# Patient Record
Sex: Male | Born: 1960 | Race: White | Hispanic: No | Marital: Married | Smoking: Current every day smoker
Health system: Southern US, Community
[De-identification: ages and names within clinical notes are randomized; demographics above are authoritative.]

## PROBLEM LIST (undated history)

## (undated) DIAGNOSIS — M1990 Unspecified osteoarthritis, unspecified site: Secondary | ICD-10-CM

## (undated) DIAGNOSIS — Z89519 Acquired absence of unspecified leg below knee: Secondary | ICD-10-CM

## (undated) DIAGNOSIS — F32A Depression, unspecified: Secondary | ICD-10-CM

## (undated) DIAGNOSIS — Z972 Presence of dental prosthetic device (complete) (partial): Secondary | ICD-10-CM

## (undated) DIAGNOSIS — F329 Major depressive disorder, single episode, unspecified: Secondary | ICD-10-CM

## (undated) DIAGNOSIS — E119 Type 2 diabetes mellitus without complications: Secondary | ICD-10-CM

## (undated) DIAGNOSIS — R0600 Dyspnea, unspecified: Secondary | ICD-10-CM

## (undated) DIAGNOSIS — F410 Panic disorder [episodic paroxysmal anxiety] without agoraphobia: Secondary | ICD-10-CM

## (undated) HISTORY — PX: HAND SURGERY: SHX662

## (undated) HISTORY — DX: Major depressive disorder, single episode, unspecified: F32.9

## (undated) HISTORY — PX: LEG AMPUTATION: SHX1105

## (undated) HISTORY — DX: Type 2 diabetes mellitus without complications: E11.9

## (undated) HISTORY — DX: Depression, unspecified: F32.A

## (undated) HISTORY — PX: LEG SURGERY: SHX1003

---

## 2005-03-14 ENCOUNTER — Emergency Department: Payer: Self-pay | Admitting: Emergency Medicine

## 2011-02-17 ENCOUNTER — Emergency Department: Payer: Self-pay | Admitting: Emergency Medicine

## 2011-04-15 ENCOUNTER — Ambulatory Visit: Payer: Self-pay | Admitting: Podiatry

## 2011-05-12 ENCOUNTER — Ambulatory Visit: Payer: Self-pay | Admitting: Pain Medicine

## 2011-05-30 ENCOUNTER — Ambulatory Visit: Payer: Self-pay | Admitting: Pain Medicine

## 2011-06-02 ENCOUNTER — Ambulatory Visit: Payer: Self-pay | Admitting: Pain Medicine

## 2011-06-02 ENCOUNTER — Other Ambulatory Visit: Payer: Self-pay | Admitting: Pain Medicine

## 2011-06-02 LAB — CBC WITH DIFFERENTIAL/PLATELET
Basophil #: 0 10*3/uL (ref 0.0–0.1)
Basophil %: 0.5 %
Eosinophil %: 1.8 %
HCT: 45 % (ref 40.0–52.0)
HGB: 15.3 g/dL (ref 13.0–18.0)
Lymphocyte %: 20.8 %
MCH: 30.5 pg (ref 26.0–34.0)
MCV: 90 fL (ref 80–100)
Monocyte %: 5.6 %
Neutrophil %: 71.3 %
RBC: 5.01 10*6/uL (ref 4.40–5.90)
RDW: 13.4 % (ref 11.5–14.5)
WBC: 10 10*3/uL (ref 3.8–10.6)

## 2011-06-20 ENCOUNTER — Ambulatory Visit: Payer: Self-pay | Admitting: Pain Medicine

## 2011-07-19 ENCOUNTER — Ambulatory Visit: Payer: Self-pay | Admitting: Pain Medicine

## 2011-07-25 ENCOUNTER — Ambulatory Visit: Payer: Self-pay | Admitting: Pain Medicine

## 2011-08-18 ENCOUNTER — Ambulatory Visit: Payer: Self-pay | Admitting: Pain Medicine

## 2011-08-29 ENCOUNTER — Ambulatory Visit: Payer: Self-pay | Admitting: Pain Medicine

## 2011-09-15 ENCOUNTER — Ambulatory Visit: Payer: Self-pay | Admitting: Pain Medicine

## 2011-09-21 ENCOUNTER — Ambulatory Visit: Payer: Self-pay | Admitting: Pain Medicine

## 2011-10-03 DIAGNOSIS — M19079 Primary osteoarthritis, unspecified ankle and foot: Secondary | ICD-10-CM | POA: Insufficient documentation

## 2011-10-03 DIAGNOSIS — IMO0002 Reserved for concepts with insufficient information to code with codable children: Secondary | ICD-10-CM | POA: Insufficient documentation

## 2011-10-18 ENCOUNTER — Ambulatory Visit: Payer: Self-pay | Admitting: Pain Medicine

## 2011-10-31 ENCOUNTER — Ambulatory Visit: Payer: Self-pay | Admitting: Pain Medicine

## 2011-11-09 DIAGNOSIS — M25579 Pain in unspecified ankle and joints of unspecified foot: Secondary | ICD-10-CM | POA: Insufficient documentation

## 2011-12-15 ENCOUNTER — Ambulatory Visit: Payer: Self-pay | Admitting: Pain Medicine

## 2011-12-26 DIAGNOSIS — M12579 Traumatic arthropathy, unspecified ankle and foot: Secondary | ICD-10-CM | POA: Insufficient documentation

## 2012-01-16 ENCOUNTER — Ambulatory Visit: Payer: Self-pay | Admitting: Pain Medicine

## 2012-02-14 ENCOUNTER — Ambulatory Visit: Payer: Self-pay | Admitting: Pain Medicine

## 2012-02-22 ENCOUNTER — Ambulatory Visit: Payer: Self-pay | Admitting: Pain Medicine

## 2012-03-14 ENCOUNTER — Emergency Department: Payer: Self-pay | Admitting: Emergency Medicine

## 2012-03-15 ENCOUNTER — Ambulatory Visit: Payer: Self-pay | Admitting: Pain Medicine

## 2012-03-26 ENCOUNTER — Ambulatory Visit: Payer: Self-pay | Admitting: Pain Medicine

## 2012-04-07 LAB — CBC
HGB: 14.4 g/dL (ref 13.0–18.0)
MCH: 29.6 pg (ref 26.0–34.0)
MCHC: 33.3 g/dL (ref 32.0–36.0)
MCV: 89 fL (ref 80–100)
RDW: 14.4 % (ref 11.5–14.5)

## 2012-04-07 LAB — BASIC METABOLIC PANEL
Anion Gap: 6 — ABNORMAL LOW (ref 7–16)
BUN: 11 mg/dL (ref 7–18)
Calcium, Total: 8.9 mg/dL (ref 8.5–10.1)
Creatinine: 0.74 mg/dL (ref 0.60–1.30)
EGFR (African American): >60
EGFR (Non-African Amer.): >60
Glucose: 96 mg/dL (ref 65–99)
Osmolality: 279 (ref 275–301)
Potassium: 4.2 mmol/L (ref 3.5–5.1)

## 2012-04-07 LAB — CK TOTAL AND CKMB (NOT AT ARMC)
CK, Total: 135 U/L (ref 35–232)
CK-MB: 0.7 ng/mL (ref 0.5–3.6)

## 2012-04-07 LAB — TROPONIN I: Troponin-I: <0.02 ng/mL

## 2012-04-08 ENCOUNTER — Observation Stay: Payer: Self-pay | Admitting: Internal Medicine

## 2012-04-08 LAB — TROPONIN I: Troponin-I: <0.02 ng/mL

## 2012-04-16 ENCOUNTER — Ambulatory Visit: Payer: Self-pay | Admitting: Pain Medicine

## 2012-05-07 ENCOUNTER — Ambulatory Visit: Payer: Self-pay | Admitting: Pain Medicine

## 2012-06-12 ENCOUNTER — Ambulatory Visit: Payer: Self-pay | Admitting: Pain Medicine

## 2012-06-18 ENCOUNTER — Ambulatory Visit: Payer: Self-pay | Admitting: Pain Medicine

## 2012-07-09 ENCOUNTER — Ambulatory Visit: Payer: Self-pay | Admitting: Pain Medicine

## 2012-08-09 ENCOUNTER — Ambulatory Visit: Payer: Self-pay | Admitting: Pain Medicine

## 2012-09-11 ENCOUNTER — Ambulatory Visit: Payer: Self-pay | Admitting: Pain Medicine

## 2012-09-24 ENCOUNTER — Ambulatory Visit: Payer: Self-pay | Admitting: Pain Medicine

## 2012-10-09 ENCOUNTER — Ambulatory Visit: Payer: Self-pay | Admitting: Pain Medicine

## 2012-10-22 ENCOUNTER — Ambulatory Visit: Payer: Self-pay | Admitting: Pain Medicine

## 2012-11-07 ENCOUNTER — Ambulatory Visit: Payer: Self-pay | Admitting: Pain Medicine

## 2012-12-04 ENCOUNTER — Ambulatory Visit: Payer: Self-pay | Admitting: Pain Medicine

## 2012-12-12 ENCOUNTER — Ambulatory Visit: Payer: Self-pay | Admitting: Pain Medicine

## 2013-01-03 ENCOUNTER — Ambulatory Visit: Payer: Self-pay | Admitting: Pain Medicine

## 2013-01-18 DIAGNOSIS — R030 Elevated blood-pressure reading, without diagnosis of hypertension: Secondary | ICD-10-CM | POA: Insufficient documentation

## 2013-02-07 ENCOUNTER — Ambulatory Visit: Payer: Self-pay | Admitting: Pain Medicine

## 2013-02-20 ENCOUNTER — Ambulatory Visit: Payer: Self-pay | Admitting: Pain Medicine

## 2013-03-05 ENCOUNTER — Ambulatory Visit: Payer: Self-pay | Admitting: Pain Medicine

## 2013-04-01 ENCOUNTER — Ambulatory Visit: Payer: Self-pay | Admitting: Pain Medicine

## 2013-05-02 ENCOUNTER — Ambulatory Visit: Payer: Self-pay | Admitting: Pain Medicine

## 2013-05-08 ENCOUNTER — Ambulatory Visit: Payer: Self-pay | Admitting: Pain Medicine

## 2013-06-04 ENCOUNTER — Ambulatory Visit: Payer: Self-pay | Admitting: Pain Medicine

## 2013-06-10 ENCOUNTER — Ambulatory Visit: Payer: Self-pay | Admitting: Pain Medicine

## 2013-07-04 ENCOUNTER — Ambulatory Visit: Payer: Self-pay | Admitting: Pain Medicine

## 2013-07-19 DIAGNOSIS — F419 Anxiety disorder, unspecified: Secondary | ICD-10-CM | POA: Insufficient documentation

## 2013-07-19 DIAGNOSIS — F329 Major depressive disorder, single episode, unspecified: Secondary | ICD-10-CM | POA: Insufficient documentation

## 2013-07-19 DIAGNOSIS — F32A Depression, unspecified: Secondary | ICD-10-CM | POA: Insufficient documentation

## 2013-08-01 ENCOUNTER — Ambulatory Visit: Payer: Self-pay | Admitting: Pain Medicine

## 2013-08-19 ENCOUNTER — Ambulatory Visit: Payer: Self-pay | Admitting: Pain Medicine

## 2013-08-29 ENCOUNTER — Ambulatory Visit: Payer: Self-pay | Admitting: Pain Medicine

## 2013-10-01 ENCOUNTER — Ambulatory Visit: Payer: Self-pay | Admitting: Pain Medicine

## 2013-10-23 ENCOUNTER — Ambulatory Visit: Payer: Self-pay | Admitting: Pain Medicine

## 2013-10-28 ENCOUNTER — Ambulatory Visit: Payer: Self-pay | Admitting: Pain Medicine

## 2014-01-03 DIAGNOSIS — T879 Unspecified complications of amputation stump: Secondary | ICD-10-CM | POA: Insufficient documentation

## 2014-01-03 DIAGNOSIS — G546 Phantom limb syndrome with pain: Secondary | ICD-10-CM | POA: Insufficient documentation

## 2014-05-11 IMAGING — CR DG HIP COMPLETE 2+V*L*
1 series · 3 of 3 positions shown · non-contrast
Comparison: none

REASON FOR EXAM: fall/hip pain
COMMENTS:

PROCEDURE:     DXR - DXR HIP LEFT COMPLETE  - March 14, 2012 [DATE]
RESULT:     AP and lateral views of the left hip reveal the bones to be
adequately mineralized. There is no evidence of an acute fracture nor
dislocation. The joint space is normal.

[Series 1: ap · 0.17mm/px · 3 of 3 slices shown]
[im 1/3]
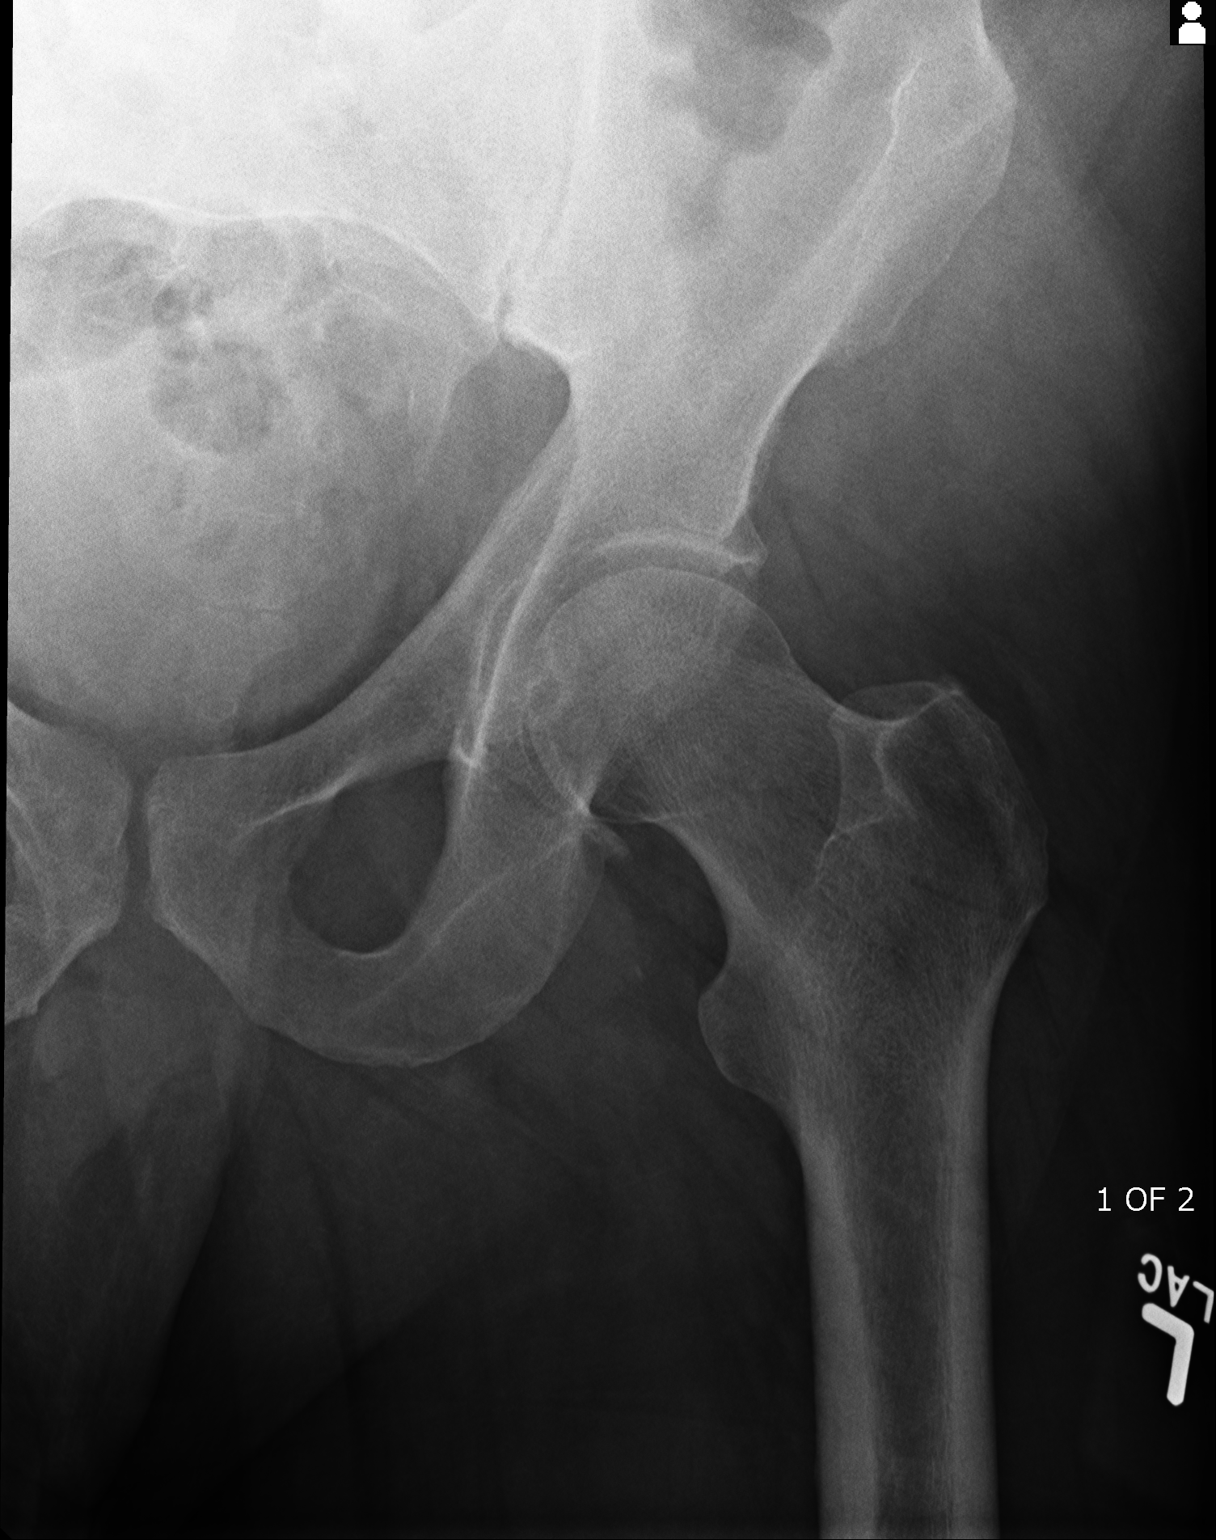
[im 2/3]
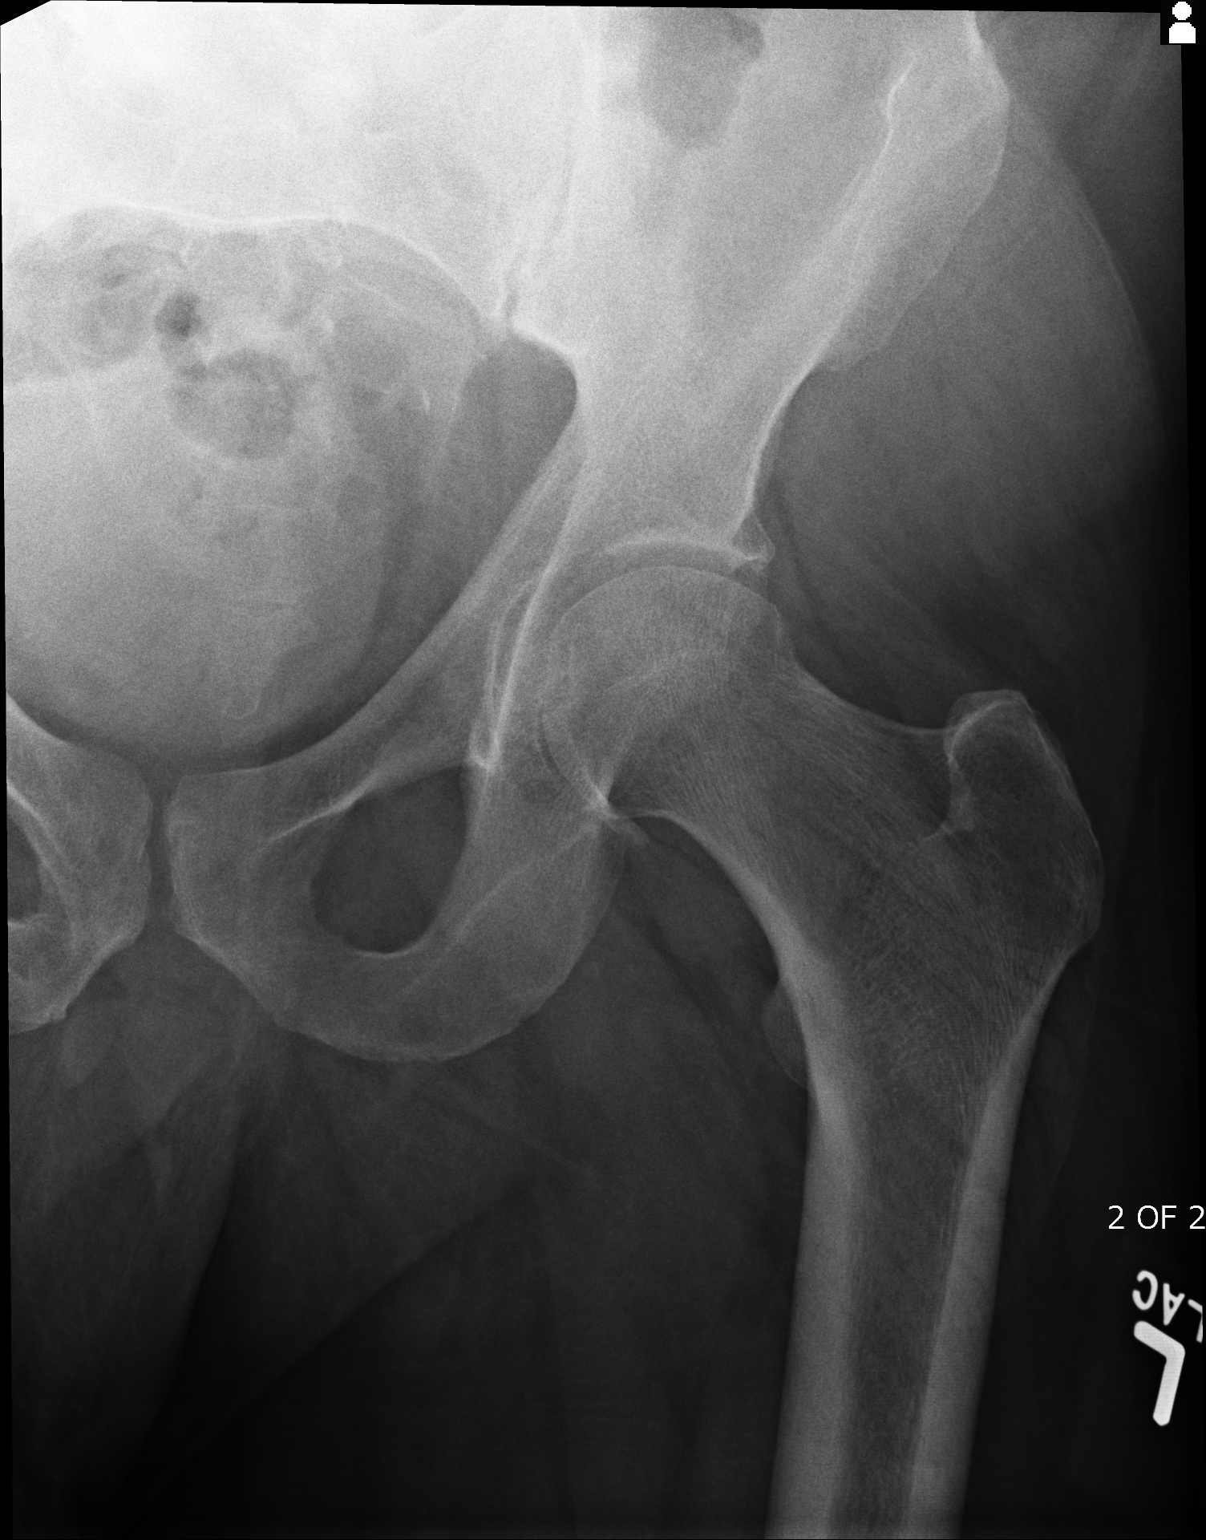
[im 3/3]
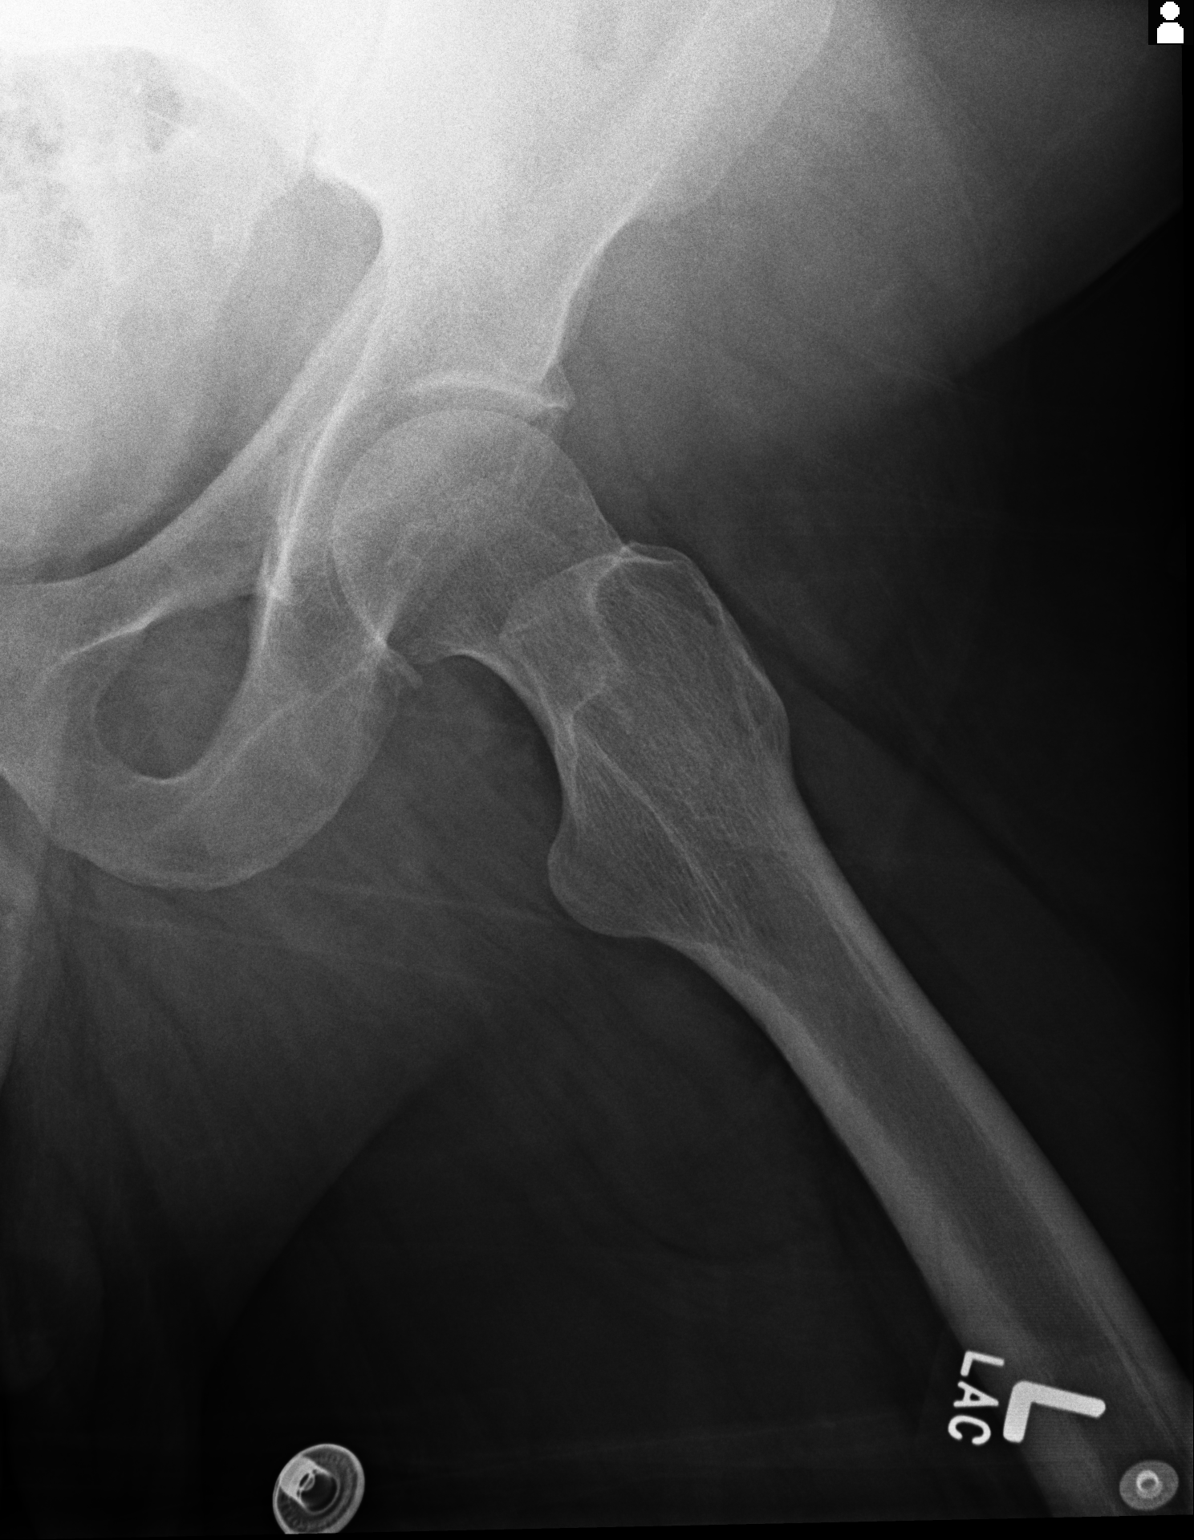

[3 of 3 positions shown; findings below may reference images not displayed]

IMPRESSION: There is no acute bony abnormality of the left hip.

[REDACTED]
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
## 2014-07-29 NOTE — Consult Note (Signed)
Brief Consult Note: Diagnosis: cad risk factors with chest and ar pain and no current evidence of mi or ecg changes.   Patient was seen by consultant.   Consult note dictated.   Comments: no further cardiac diagnositcs at this time ambulate and follow for sx continue risk factor meds ok for dc to home from cardiac standpoint with outpt fu Aldair Rickel this week and consider stress test at that time.  Electronic Signatures: Corey Skains (MD)  (Signed 29-Dec-13 07:37)  Authored: Brief Consult Note   Last Updated: 29-Dec-13 07:37 by Corey Skains (MD)

## 2014-07-29 NOTE — H&P (Signed)
PATIENT NAME:  [name] Day, [name] Day MR#:  [id number] DATE OF BIRTH:  07-Sep-1960  DATE OF ADMISSION:  04/08/2012  PRIMARY CARE PHYSICIAN:  Dr. Shade Flood at [facility].   REFERRING PHYSICIAN:  Gretchen Short. Beather Arbour, MD   CHIEF COMPLAINT:  Chest pain.   HISTORY OF PRESENT ILLNESS:  The patient is 54 year old Caucasian male with a history of panic attacks and obesity. He was in his usual state of health until yesterday. He states that he woke up at 7 in the morning and felt unwell. He was weak and during the day did not feel well as well, but nothing specific. Then around the afternoon he developed a pressure-like feeling in the neck area, felt tired and then numbness in the left hand which had extended to reach the left shoulder and along with that he has pain in the neck area, back of the head, the jaw and the left upper chest. The patient felt that it was hard to breathe or mild shortness of breath, but that was very subtle. He had nausea throughout the day, no vomiting, and no palpitations, no syncope, no cough and no hemoptysis. His chest and shoulder pain are described as like muscle spasm tightness and then it will back off. The pain right now is mild, about 1 on a scale of 10.   REVIEW OF SYSTEMS:  CONSTITUTIONAL: Denies having any fever. No chills. No night sweats. No fatigue, but only today he felt a little tired.  EYES: No blurring of vision. No double vision.  ENT: No hearing impairment. No sore throat. No dysphagia.  CARDIOVASCULAR: Reports the chest pain as above. No syncope. No edema. No palpitations.  RESPIRATORY: No cough. No sputum production. No hemoptysis.  GASTROINTESTINAL: No abdominal pain. No vomiting. No diarrhea, but reported nausea.  GENITOURINARY: No dysuria. No frequency of urination.  MUSCULOSKELETAL: No joint pain other than the left ankle pain, this is chronic since the fracture. No joint swelling. No muscular pain or swelling.  INTEGUMENTARY: No skin rash. No ulcers.   NEUROLOGY: No focal weakness. No seizure activity. No headache.  PSYCHIATRY: He has anxiety and a great deal of panic attacks. No depression.  ENDOCRINE: No polyuria or polydipsia. No heat or cold intolerance.  HEMATOLOGY: No easy bruisability. No lymph node enlargement.   PAST MEDICAL HISTORY:  Panic attacks and left ankle fracture and underwent surgery. Since that time, he has had chronic left ankle pain. He is maintained on OxyContin, obesity and tobacco abuse.   PAST SURGICAL HISTORY:  Left foot surgery after a fracture.   FAMILY HISTORY:  His father at age of 36 had coronary artery disease and had a stent. His mother died at age of 68 from metastatic cancer, the origin is unknown.   SOCIAL HISTORY:  He is married, living with his wife who supports him and he is unemployed.   SOCIAL HABITS:  Chronic smoker, 1 pack per day since age of 73. No history of alcoholism. He quit alcohol completely about 2 years ago. When he was young, he did some recreational abuse of marijuana and some cocaine, but not anymore.   ADMISSION MEDICATIONS:  OxyContin 10 mg twice a day, oxycodone 10 mg q. 6 hours p.r.n. if needed, Xanax 1 mg p.r.n., Lyrica 50 mg twice a day, Cymbalta 60 mg twice a day, Aleve 220 mg 2 tablets twice a day.   ALLERGIES:  TRAMADOL CAUSES GI SIDE EFFECTS.   PHYSICAL EXAMINATION: VITAL SIGNS: Blood pressure 128/60, respiratory rate 16,  pulse 88, temperature 97.8 and oxygen saturation 95%.  GENERAL APPEARANCE: Young middle-aged male, obese, lying in bed in no acute distress.  HEAD AND NECK: No pallor. No icterus. No cyanosis.  ENT: Ear examination revealed normal hearing. No discharge. No lesions. Nasal mucosa was normal without ulcers, no discharge. Oropharyngeal area showed no oral lesions, no ulcers and no oral thrush. He is edentulous and not wearing any dentures.  EYES: Pupils were constricted. I could not elicit any reactivity to light.  NECK: Supple. Trachea midline. No  thyromegaly. No cervical lymphadenopathy. No masses.  HEART: Normal S1, S2. No S3, S4. No murmur. No gallop. No carotid bruits.  RESPIRATORY: Normal breathing pattern without use of accessory muscles. No rales. No wheezing.  ABDOMEN: Morbidly obese, soft without tenderness. No hepatosplenomegaly. No masses. No hernias.  SKIN: No ulcers. No subcutaneous nodules.  MUSCULOSKELETAL: No joint swelling. No clubbing.  NEUROLOGIC: Cranial nerves II through XII are intact. No focal motor deficit. Sense of touch is preserved.  PSYCHIATRIC: The patient is alert and oriented x 3. Mood and affect are normal.   DIAGNOSTIC DATA:  Chest x-ray showed no acute cardiopulmonary abnormality. EKG showed normal sinus rhythm at rate of 85 per minute, unremarkable EKG. Blood workup showed serum glucose 96, BUN 11, creatinine 0.7, sodium 140, potassium 4.2, calcium 8.9. Total CPK is 135. Troponin is less than 0.02. CBC showed white count 15,000, hemoglobin 14, hematocrit 43 and platelet count 193.   ASSESSMENT:   1.  Chest pain, etiology is unclear if it is cardiac versus musculoskeletal. The differential diagnosis will include cervical radiculopathy.  2.  Panic attacks.  3.  Leukocytosis, etiology is unclear.  4.  Chronic left ankle pain.  5.  Tobacco abuse.  6.  Obesity.   PLAN:  We will admit the patient for observation over telemetry. Followup on cardiac enzymes. Obtain Myoview nuclear stress test. Start aspirin, beta-blocker and nitroglycerin. Continue home medications as listed above. The patient needs to quit tobacco.   TIME SPENT IN EVALUATING THIS PATIENT:  More than 55 minutes.    ____________________________ Clovis Pu. Lenore Manner, MD amd:si D: 04/08/2012 01:52:00 ET T: 04/08/2012 23:45:48 ET JOB#: [id number]  cc: Clovis Pu. Lenore Manner, MD, <Dictator> Ellin Saba MD ELECTRONICALLY SIGNED 04/10/2012 6:22

## 2014-08-01 NOTE — Consult Note (Signed)
PATIENT NAME:  [name] [name], [name] [name] MR#:  [id number] DATE OF BIRTH:  June 13, 1960  DATE OF CONSULTATION:  04/08/2012  REFERRING PHYSICIAN:  Dr. Tressia Miners.   CONSULTING PHYSICIAN:  Corey Skains, MD  PRIMARY CARE PHYSICIAN:  Dr. Preston Fleeting.    REASON FOR CONSULTATION:  Chest discomfort with possible unstable angina.   CHIEF COMPLAINT: "I have chest pain."   HISTORY OF PRESENT ILLNESS: This is a 54 year old male with known chronic back pain and leg pain, with known hypertension and tobacco abuse for which he has had new onset of substernal chest discomfort radiating to his left arm with arm tingling and pain. EKG had shown normal sinus rhythm and troponin and CK-MB have been within normal limits. The patient has had full relief of his chest pain with the oxygen and nitroglycerin, and now is feeling much better. Serial ECG and enzymes have remained normal. The patient has had previous hypertension control and back pain control with appropriate medications which are stable.   REVIEW OF SYSTEMS: The remainder review of systems negative for vision change, ringing in the ears, hearing loss, cough, congestion, heartburn, nausea, vomiting, diarrhea, bloody stools, stomach pain, extremity pain, leg weakness, cramping of the buttocks, known blood clots, headaches, blackouts, dizzy spells, nosebleeds, congestion, trouble swallowing, frequent urination, urination at night, muscle weakness, numbness, anxiety, depression, skin lesions or skin rashes.   PAST MEDICAL HISTORY: 1.  Chronic back pain and leg pain.  2.  Hypertension.   FAMILY HISTORY: Father and mother had cardiovascular disease with early onset of cardiovascular disease and myocardial infarction.   SOCIAL HISTORY: The patient smokes 1 pack per day, denies alcohol use.   ALLERGIES: AS LISTED.   PHYSICAL EXAMINATION: VITAL SIGNS: Blood pressure 126/68 bilaterally, heart rate 72 upright reclining and regular.  GENERAL: He is a well appearing  male in no acute distress.  HEENT: No icterus, thyromegaly, ulcers, hemorrhage or xanthelasma.  HEART: Regular rate and rhythm with normal S1 and S2 without murmur, gallop or rub. PMI is normal size and placement. Carotid upstroke normal without bruit. Jugular venous pressure is normal.  LUNGS: Lungs clear to auscultation with normal respirations.  ABDOMEN: Soft, nontender, without hepatosplenomegaly or masses. Abdominal aorta is normal size without bruit.  EXTREMITIES: Show 2+ bilateral pulses in dorsal pedal, radial and femoral arteries without lower extremity edema, cyanosis, clubbing or ulcers.  NEUROLOGIC: He is oriented to time, place and person with normal mood and affect.   ASSESSMENT: A 54 year old male with hypertension, tobacco abuse, family history of coronary artery disease, having acute onset of chest and arm pain consistent with unstable angina with no current evidence of myocardial infarction.   RECOMMENDATIONS: 1.  Continue serial ECG and enzymes to assess for possible myocardial infarction.  2.  Continue hypertension control.  3.  Nitroglycerin topically.  4.  Consider echocardiogram for LV systolic dysfunction or valvular heart disease.  5.  Ambulate and follow for any further significant symptoms.  6.  Possible discharge home if able for outpatient stress test if needed.      ____________________________ Corey Skains, MD bjk:cs D: 04/08/2012 08:55:08 ET T: 04/08/2012 20:06:14 ET JOB#: [id number]  cc: Corey Skains, MD, <Dictator> Corey Skains MD ELECTRONICALLY SIGNED 04/26/2012 8:24

## 2014-08-01 NOTE — Discharge Summary (Signed)
PATIENT NAME:  [name] [name] MR#:  [id number] DATE OF BIRTH:  04/17/1960  DATE OF ADMISSION:  04/08/2012 DATE OF DISCHARGE:  04/08/2012  PRIMARY CARE PHYSICIAN: None local.  CARDIOLOGIST: Serafina Royals, MD  DISCHARGE DIAGNOSES: 1. Chest pain, noncardiac. 2. Chronic pain, left ankle. 3. Obesity.   HISTORY OF PRESENT ILLNESS: This is a 54 year old male with obesity, panic attacks, tobacco abuse, left ankle surgery and chronic pain at that point who presented with chest pain symptoms, started feeling unwell since morning and had also feeling of nauseousness and numbness progressively radiating to the left shoulder, neck, jaw and back of the head so decided to come to the Emergency Room. Chest x-ray, EKG and troponins were negative so admitted for observation. Cardiology consult was done for further management. The cardiologist saw him this morning, second troponin was negative, so he suggested to discharge him home with instructions to follow up in the clinic and if needed then he might do a stress test as outpatient in the clinic.  So, as mentioned above, his chest pain went away after getting admitted. He just had some headache left over and he attributed that to his chronic use of pain medication for his ankle pain, which he did not receive since yesterday morning.   Ankle surgery, pain management. He is following with Dr. Marry Guan and also Dr. Primus Bravo for pain management. I advised to continue follow-up with them as outpatient.  CONSULTANT: Serafina Royals, MD - Cardiology.   IMPORTANT LAB RESULTS DURING HOSPITAL STAY: Two troponins less than 0.02.   Chest x-ray: No acute cardiopulmonary disease.  Glucose 96, BUN 11, creatinine 0.74, sodium 140, potassium 4.2, chloride 105 and CO2 29.   CONDITION ON DISCHARGE: Stable.   CODE STATUS: FULL CODE.   DISCHARGE MEDICATIONS: 1. Tylenol extra strength 500 mg oral tablet 2 tablets orally as needed. 2. Aleve 220 mg oral tablet 2 times a day  as needed. 3. Xanax 1 mg oral tablet as needed for anxiety. 4. Cymbalta 60 mg oral delayed-release capsule 2 times a day.  5. OxyContin 10 mg every 12 hours. 6. Oxycodone 10 mg half tablet every 4 to 6 hours for pain as needed. 7. Lyrica 50 mg 1 tablet p.o. daily or  2 times a day as tolerated.  8. Oxycodone 10 mg oral tablet every 6 hours as needed for pain.   HOME HEALTH: None.   HOME OXYGEN: None.   DISCHARGE DIET: Low fat, low cholesterol, regular consistency diet.  ACTIVITY LIMITATION: As tolerated.   DISCHARGE FOLLOW-UP: Follow up within 1 to 2 weeks with cardiology clinic, with Dr. Nehemiah Massed.  TOTAL TIME SPENT ON DISCHARGE: 45 minutes.  ____________________________ Ceasar Lund Anselm Jungling, MD vgv:sb D: 04/08/2012 14:48:36 ET T: 04/09/2012 07:42:53 ET JOB#: [id number]  cc: Ceasar Lund. Anselm Jungling, MD, <Dictator> Corey Skains, MD Vaughan Basta MD ELECTRONICALLY SIGNED 05/07/2012 8:23

## 2014-08-03 NOTE — Op Note (Signed)
PATIENT NAME:  [name] Day, IDA MR#:  [id number] DATE OF BIRTH:  1961-03-01  DATE OF PROCEDURE:  04/15/2011  PREOPERATIVE DIAGNOSIS: Retained painful hardware anterior aspect, left ankle.   POSTOPERATIVE DIAGNOSIS: Retained painful hardware anterior aspect, left ankle.  PROCEDURE: Removal of two screws from anterior left ankle.   SURGEON: Henreitta Spittler A. Vickki Muff, DPM   ANESTHESIA: Local with MAC.   HEMOSTASIS: Epinephrine infiltrated along incision site 1:200,000.   COMPLICATIONS: None.   SPECIMEN: None.   OPERATIVE PROCEDURE: The patient was brought into the Operating Room and placed on the operating table in the supine position. IV sedation was administered by the Anesthesia team. The left lower extremity was prepped and draped in the usual sterile fashion. Attention was directed to the anterior aspect of the ankle where the two previously placed screws were palpated, and an incision was made longitudinal in nature. Blunt dissection was carried down. The initial screw was directly underlying the incision and was removed quite easily. The second screw was just slightly medial and deeper. I had to perform a little bit of deeper dissection to this area but was able to easily find the screw and remove it in toto. The wounds were then flushed with copious amounts of irrigation, and layered closure was performed with a 4-0 Vicryl for the subcutaneous tissue and a 3-0 nylon for skin. The patient tolerated the procedure and anesthesia well and was transported from the Operating Room to the PACU with all vital signs stable and neurovascular status intact. A well compressive sterile dressing was placed. He will be allowed to ambulate as tolerated. I will see him in the outpatient clinic in 5 to 7 days. We will give him a prescription for Percocet for pain. ____________________________ Pete Glatter. Vickki Muff, DPM jaf:cbb D: 04/15/2011 11:44:15 ET T: 04/15/2011 12:39:49 ET JOB#: [id number] [place]  DPM ELECTRONICALLY SIGNED 05/12/2011 15:11

## 2017-01-01 ENCOUNTER — Emergency Department: Payer: Medicaid - Out of State

## 2017-01-01 ENCOUNTER — Emergency Department
Admission: EM | Admit: 2017-01-01 | Discharge: 2017-01-01 | Disposition: A | Discharge status: Home / Self Care | Payer: Medicaid - Out of State | Attending: Student in an Organized Health Care Education/Training Program | Admitting: Student in an Organized Health Care Education/Training Program

## 2017-01-01 ENCOUNTER — Encounter: Payer: Self-pay | Admitting: Emergency Medicine

## 2017-01-01 DIAGNOSIS — R079 Chest pain, unspecified: Secondary | ICD-10-CM | POA: Diagnosis present

## 2017-01-01 DIAGNOSIS — J40 Bronchitis, not specified as acute or chronic: Secondary | ICD-10-CM | POA: Diagnosis not present

## 2017-01-01 DIAGNOSIS — F17210 Nicotine dependence, cigarettes, uncomplicated: Secondary | ICD-10-CM | POA: Diagnosis not present

## 2017-01-01 HISTORY — DX: Panic disorder (episodic paroxysmal anxiety): F41.0

## 2017-01-01 LAB — BASIC METABOLIC PANEL
Anion gap: 9 (ref 5–15)
BUN: 11 mg/dL (ref 6–20)
CALCIUM: 9.5 mg/dL (ref 8.9–10.3)
CO2: 27 mmol/L (ref 22–32)
CREATININE: 0.53 mg/dL — AB (ref 0.61–1.24)
Chloride: 102 mmol/L (ref 101–111)
GFR calc non Af Amer: >60 mL/min (ref >60)
Glucose, Bld: 119 mg/dL — ABNORMAL HIGH (ref 65–99)
Potassium: 4.1 mmol/L (ref 3.5–5.1)
SODIUM: 138 mmol/L (ref 135–145)

## 2017-01-01 LAB — CBC
HCT: 44.6 % (ref 40.0–52.0)
Hemoglobin: 15.8 g/dL (ref 13.0–18.0)
MCH: 29.9 pg (ref 26.0–34.0)
MCHC: 35.4 g/dL (ref 32.0–36.0)
MCV: 84.6 fL (ref 80.0–100.0)
PLATELETS: 218 10*3/uL (ref 150–440)
RBC: 5.28 MIL/uL (ref 4.40–5.90)
RDW: 14.4 % (ref 11.5–14.5)
WBC: 13.1 10*3/uL — ABNORMAL HIGH (ref 3.8–10.6)

## 2017-01-01 LAB — TROPONIN I

## 2017-01-01 MED ORDER — IPRATROPIUM-ALBUTEROL 0.5-2.5 (3) MG/3ML IN SOLN
3.0000 mL | Freq: Once | RESPIRATORY_TRACT | Status: AC
  Administered 2017-01-01: 3 mL via RESPIRATORY_TRACT
  Filled 2017-01-01 (×2): qty 3

## 2017-01-01 MED ORDER — DULOXETINE HCL 30 MG PO CPEP: 30.0000 mg | ORAL_CAPSULE | Freq: Every day | ORAL | 0 refills | Status: DC

## 2017-01-01 MED ORDER — ALBUTEROL SULFATE HFA 108 (90 BASE) MCG/ACT IN AERS: 2.0000 | INHALATION_SPRAY | Freq: Four times a day (QID) | RESPIRATORY_TRACT | 2 refills | Status: DC | PRN

## 2017-01-01 MED ORDER — OXYCODONE HCL 5 MG PO TABS: 5.0000 mg | ORAL_TABLET | Freq: Three times a day (TID) | ORAL | 0 refills | Status: DC | PRN

## 2017-01-01 MED ORDER — ACETAMINOPHEN 500 MG PO TABS
1000.0000 mg | ORAL_TABLET | Freq: Once | ORAL | Status: AC
  Administered 2017-01-01: 1000 mg via ORAL
  Filled 2017-01-01 (×2): qty 2

## 2017-01-01 MED ORDER — DOXYCYCLINE HYCLATE 100 MG PO TABS: 100.0000 mg | ORAL_TABLET | Freq: Two times a day (BID) | ORAL | 0 refills | Status: AC

## 2017-01-01 MED ORDER — DOXYCYCLINE HYCLATE 100 MG PO TABS
100.0000 mg | ORAL_TABLET | Freq: Once | ORAL | Status: AC
  Administered 2017-01-01: 100 mg via ORAL
  Filled 2017-01-01: qty 1

## 2017-01-01 MED ORDER — PREDNISONE 20 MG PO TABS: 40.0000 mg | ORAL_TABLET | Freq: Every day | ORAL | 0 refills | Status: AC

## 2017-01-01 MED ORDER — PREDNISONE 20 MG PO TABS
60.0000 mg | ORAL_TABLET | Freq: Once | ORAL | Status: AC
Start: 2017-01-01 — End: 2017-01-01
  Administered 2017-01-01: 60 mg via ORAL
  Filled 2017-01-01: qty 3

## 2017-01-01 NOTE — ED Notes (Signed)
NAD noted at time of D/C. Pt denies questions or concerns. Pt ambulatory to the lobby at this time.  

## 2017-01-01 NOTE — ED Notes (Signed)
MD aware of pt's HR at time of D/C. MD states okay for D/C.

## 2017-01-01 NOTE — ED Provider Notes (Signed)
[facility] Emergency Department Provider Note    First MD Initiated Contact with Patient 01/01/17 1603     (approximate)  I have reviewed the triage vital signs and the nursing notes.   HISTORY  Chief Complaint Chest Pain    HPI [name] Day. is a 56 y.o. male with the one pack per day smoking history as well as history of anxiety presents with chief complaint of shortness of breath and chest congestion that started last night associated with feeling that he can't catch his breath. States she's also had some headache starting yesterday. Does not have any inhalers. Has not been on any antibiotics. Denies any chest pain or pressure. No lower extremity swelling. No orthopnea. No recent sick contacts.   Past Medical History:  Diagnosis Date  . Panic attack    No family history on file. Past Surgical History:  Procedure Laterality Date  . HAND SURGERY    . LEG AMPUTATION     There are no active problems to display for this patient.     Prior to Admission medications   Medication Sig Start Date End Date Taking? Authorizing Provider  albuterol (PROVENTIL HFA;VENTOLIN HFA) 108 (90 Base) MCG/ACT inhaler Inhale 2 puffs into the lungs every 6 (six) hours as needed for wheezing or shortness of breath. 01/01/17   Merlyn Lot, MD  doxycycline (VIBRA-TABS) 100 MG tablet Take 1 tablet (100 mg total) by mouth 2 (two) times daily. 01/01/17 01/08/17  Merlyn Lot, MD  DULoxetine (CYMBALTA) 30 MG capsule Take 1 capsule (30 mg total) by mouth daily. 01/01/17 01/15/17  Merlyn Lot, MD  oxyCODONE (ROXICODONE) 5 MG immediate release tablet Take 1 tablet (5 mg total) by mouth every 8 (eight) hours as needed. Do not fill/dispense without Rx for Doxy/albuterol/prednisone 01/01/17 01/01/18  Merlyn Lot, MD  predniSONE (DELTASONE) 20 MG tablet Take 2 tablets (40 mg total) by mouth daily. 01/01/17 01/06/17  Merlyn Lot, MD     Allergies Tramadol    Social History Social History  Substance Use Topics  . Smoking status: Current Every Day Smoker    Packs/day: 1.00    Types: Cigarettes  . Smokeless tobacco: Never Used  . Alcohol use No    Review of Systems Patient denies headaches, rhinorrhea, blurry vision, numbness, shortness of breath, chest pain, edema, cough, abdominal pain, nausea, vomiting, diarrhea, dysuria, fevers, rashes or hallucinations unless otherwise stated above in HPI. ____________________________________________   PHYSICAL EXAM:  VITAL SIGNS: Vitals:   01/01/17 1341  BP: (!) 145/89  Pulse: 96  Resp: 16  Temp: 98.1 F (36.7 C)  SpO2: 96%    Constitutional: Alert and oriented.  in no acute distress. Eyes: Conjunctivae are normal.  Head: Atraumatic. Nose: No congestion/rhinnorhea. Mouth/Throat: Mucous membranes are moist.   Neck: No stridor. Painless ROM.  Cardiovascular: Normal rate, regular rhythm. Grossly normal heart sounds.  Good peripheral circulation. Respiratory: Normal respiratory effort.  No retractions. Lungs with end expiratory breath sounds throughout, no rhonchi Gastrointestinal: Soft and nontender. No distention. No abdominal bruits. No CVA tenderness. Genitourinary:  Musculoskeletal: No lright ower extremity tenderness nor edema.  S/p left AKA Neurologic:  Normal speech and language. No gross focal neurologic deficits are appreciated. No facial droop Skin:  Skin is warm, dry and intact. No rash noted. Psychiatric: Mood and affect are normal. Speech and behavior are normal.  ____________________________________________   LABS (all labs ordered are listed, but only abnormal results are displayed)  Results for orders placed or  performed during the hospital encounter of 01/01/17 (from the past 24 hour(s))  Basic metabolic panel     Status: Abnormal   Collection Time: 01/01/17  1:44 PM  Result Value Ref Range   Sodium 138 135 - 145 mmol/L   Potassium 4.1  3.5 - 5.1 mmol/L   Chloride 102 101 - 111 mmol/L   CO2 27 22 - 32 mmol/L   Glucose, Bld 119 (H) 65 - 99 mg/dL   BUN 11 6 - 20 mg/dL   Creatinine, Ser 0.53 (L) 0.61 - 1.24 mg/dL   Calcium 9.5 8.9 - 10.3 mg/dL   GFR calc non Af Amer >60 >60 mL/min   GFR calc Af Amer >60 >60 mL/min   Anion gap 9 5 - 15  CBC     Status: Abnormal   Collection Time: 01/01/17  1:44 PM  Result Value Ref Range   WBC 13.1 (H) 3.8 - 10.6 K/uL   RBC 5.28 4.40 - 5.90 MIL/uL   Hemoglobin 15.8 13.0 - 18.0 g/dL   HCT 44.6 40.0 - 52.0 %   MCV 84.6 80.0 - 100.0 fL   MCH 29.9 26.0 - 34.0 pg   MCHC 35.4 32.0 - 36.0 g/dL   RDW 14.4 11.5 - 14.5 %   Platelets 218 150 - 440 K/uL  Troponin I     Status: None   Collection Time: 01/01/17  1:44 PM  Result Value Ref Range   Troponin I <0.03 <0.03 ng/mL   ____________________________________________  EKG My review and personal interpretation at Time: 13:28   Indication: sob  Rate: 100  Rhythm: sinus Axis: normal Other: rbbb, non specific st changes, no stemi ____________________________________________  RADIOLOGY  I personally reviewed all radiographic images ordered to evaluate for the above acute complaints and reviewed radiology reports and findings.  These findings were personally discussed with the patient.  Please see medical record for radiology report.  ____________________________________________   PROCEDURES  Procedure(s) performed:  Procedures    Critical Care performed: no ____________________________________________   INITIAL IMPRESSION / ASSESSMENT AND PLAN / ED COURSE  Pertinent labs & imaging results that were available during my care of the patient were reviewed by me and considered in my medical decision making (see chart for details).  DDX: Asthma, copd, CHF, pna, ptx, malignancy, Pe, anemia   [name] Day. is a 56 y.o. who presents to the ED with shortness of breath and congestion since yesterday the setting of a heavy  smoker. Does not have any chest pain or pressure to suggest ACS. His troponin is negative. EKG shows right bundle branch block but in the setting of his wheezing on exam and heavy smoking history I do suspect some underlying pulmonary disease. Does have mild leukocytosis and given his productive cough and concern for component of bronchitis. Is not clinically consistent with pulmonary embolism. Patient was given a nebulizer treatment with significant improvement in his respirations improved air movement. Patient started on steroids. Patient is appropriate for discharge home with outpatient management and follow-up with PCP.  Have discussed with the patient and available family all diagnostics and treatments performed thus far and all questions were answered to the best of my ability. The patient demonstrates understanding and agreement with plan.       ____________________________________________   FINAL CLINICAL IMPRESSION(S) / ED DIAGNOSES  Final diagnoses:  Bronchitis      NEW MEDICATIONS STARTED DURING THIS VISIT:  New Prescriptions   ALBUTEROL (PROVENTIL HFA;VENTOLIN HFA) 108 (90 BASE)  MCG/ACT INHALER    Inhale 2 puffs into the lungs every 6 (six) hours as needed for wheezing or shortness of breath.   DOXYCYCLINE (VIBRA-TABS) 100 MG TABLET    Take 1 tablet (100 mg total) by mouth 2 (two) times daily.   DULOXETINE (CYMBALTA) 30 MG CAPSULE    Take 1 capsule (30 mg total) by mouth daily.   OXYCODONE (ROXICODONE) 5 MG IMMEDIATE RELEASE TABLET    Take 1 tablet (5 mg total) by mouth every 8 (eight) hours as needed. Do not fill/dispense without Rx for Doxy/albuterol/prednisone   PREDNISONE (DELTASONE) 20 MG TABLET    Take 2 tablets (40 mg total) by mouth daily.     Note:  This document was prepared using Dragon voice recognition software and may include unintentional dictation errors.    Merlyn Lot, MD 01/01/17 [phone]

## 2017-01-01 NOTE — ED Notes (Signed)
Pt presents with pain that includes head, neck, chest since last night. States that he has had some upper respiratory congestion as well, and that he feels "like I can't catch my breath." Pt states he had some chest pain in the past that was attributed to anxiety, but this feels different. Pt alert & oriented. NAD noted.

## 2017-01-01 NOTE — ED Triage Notes (Signed)
FIRST NURSE NOTE-c/o CP/SHOB. Appears to have mild dyspnea while talking. Pulled for EKG

## 2017-03-01 ENCOUNTER — Ambulatory Visit: Payer: Medicaid Other | Admitting: Family Medicine

## 2017-03-01 ENCOUNTER — Encounter: Payer: Self-pay | Admitting: Family Medicine

## 2017-03-01 VITALS — BP 121/80 | HR 105 | Temp 97.8°F | Ht 72.0 in | Wt 338.0 lb

## 2017-03-01 DIAGNOSIS — R062 Wheezing: Secondary | ICD-10-CM

## 2017-03-01 DIAGNOSIS — R635 Abnormal weight gain: Secondary | ICD-10-CM

## 2017-03-01 DIAGNOSIS — G8929 Other chronic pain: Secondary | ICD-10-CM

## 2017-03-01 DIAGNOSIS — M25552 Pain in left hip: Secondary | ICD-10-CM

## 2017-03-01 DIAGNOSIS — Z7689 Persons encountering health services in other specified circumstances: Secondary | ICD-10-CM

## 2017-03-01 DIAGNOSIS — F3341 Major depressive disorder, recurrent, in partial remission: Secondary | ICD-10-CM | POA: Diagnosis not present

## 2017-03-01 MED ORDER — PREDNISONE 10 MG PO TABS: ORAL_TABLET | ORAL | 0 refills | Status: DC

## 2017-03-01 MED ORDER — BETAMETHASONE DIPROPIONATE 0.05 % EX CREA: TOPICAL_CREAM | Freq: Two times a day (BID) | CUTANEOUS | 3 refills | Status: DC

## 2017-03-01 MED ORDER — MELOXICAM 7.5 MG PO TABS: 7.5000 mg | ORAL_TABLET | Freq: Every day | ORAL | 5 refills | Status: DC

## 2017-03-01 MED ORDER — AMITRIPTYLINE HCL 25 MG PO TABS: ORAL_TABLET | ORAL | 5 refills | Status: DC

## 2017-03-01 NOTE — Progress Notes (Signed)
BP 121/80 (BP Location: Right Arm, Patient Position: Sitting, Cuff Size: Large)   Pulse (!) 105   Temp 97.8 F (36.6 C) (Oral)   Ht 6' (1.829 m)   Wt (!) 338 lb (153.3 kg)   SpO2 96%   BMI 45.84 kg/m    Subjective:    Patient ID: [name] Day., male    DOB: 1961-02-20, 56 y.o.   MRN: [id number]  HPI: [name] Staffieri. is a 56 y.o. male  Chief Complaint  Patient presents with  . Establish Care  . Medication Refill  . Hip Pain   Patient presents today to establish care after moving here from [facility].   Has chronic hip pain and and neck pain for which he used to get regular injections as well as taking oxycodone daily. Has been out of his medications for over a month and has had a severe quality of life decrease since. Notes significant weight gain since his mobility has been decreased d/t pain.   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 States this is a major source of his pain. This has been present for about a month Had the amputation in 2013. Was being evaluated for surgical correction back in [facility] but has since moved here. Area is not draining, raw, streaking at this time. Uses betamethasone on the area occasionally.   Hx of depression and panic attacks, seems well controlled with amitriptyline. Taking faithfully without side effects. Denies SI/HI.   Last CPE was in August.   Also notes 2 weeks of wheezing and dry cough, albuterol helping some. Was previously smoking 3 ppd, now down to less than 1 ppd. Does want to eventually quit. No known asthma or COPD but has not been tested to his knowledge. Denies CP, URI sxs.   Past Medical History:  Diagnosis Date  . Depression   . Panic attack    Social History   Socioeconomic History  . Marital status: Married    Spouse name: Not on file  . Number of children: Not on file  . Years of education: Not on file  . Highest education level: Not on file  Social Needs  . Financial  resource strain: Not on file  . Food insecurity - worry: Not on file  . Food insecurity - inability: Not on file  . Transportation needs - medical: Not on file  . Transportation needs - non-medical: Not on file  Occupational History  . Not on file  Tobacco Use  . Smoking status: Current Every Day Smoker    Packs/day: 0.50    Types: Cigarettes  . Smokeless tobacco: Former Network engineer and Sexual Activity  . Alcohol use: No  . Drug use: No  . Sexual activity: Not on file  Other Topics Concern  . Not on file  Social History Narrative  . Not on file    Relevant past medical, surgical, family and social history reviewed and updated as indicated. Interim medical history since our last visit reviewed. Allergies and medications reviewed and updated.  Review of Systems  Constitutional: Positive for activity change (decreased from pain).  HENT: Negative.   Respiratory: Positive for cough, chest tightness and wheezing.   Cardiovascular: Negative.   Gastrointestinal: Negative.   Genitourinary: Negative.   Musculoskeletal: Positive for arthralgias.  Neurological: Negative.   Psychiatric/Behavioral: Negative.    Per HPI unless specifically indicated above     Objective:  BP 121/80 (BP Location: Right Arm, Patient Position: Sitting, Cuff Size: Large)   Pulse (!) 105   Temp 97.8 F (36.6 C) (Oral)   Ht 6' (1.829 m)   Wt (!) 338 lb (153.3 kg)   SpO2 96%   BMI 45.84 kg/m   Wt Readings from Last 3 Encounters:  03/01/17 (!) 338 lb (153.3 kg)  01/01/17 (!) 316 lb (143.3 kg)    Physical Exam  Constitutional: He is oriented to person, place, and time. He appears well-developed and well-nourished. No distress.  HENT:  Head: Atraumatic.  Eyes: Conjunctivae are normal. Pupils are equal, round, and reactive to light. No scleral icterus.  Neck: Normal range of motion. Neck supple.  Cardiovascular: Normal rate and normal heart sounds.  Pulmonary/Chest: Effort normal. No  respiratory distress. He has wheezes.  Musculoskeletal: Normal range of motion.  S/p left BKA  Neurological: He is alert and oriented to person, place, and time.  Skin: Skin is warm and dry. There is erythema (mild erythema at amputation site where pt describes his pain).  Psychiatric: He has a normal mood and affect. His behavior is normal.  Nursing note and vitals reviewed.  Results for orders placed or performed during the hospital encounter of 70/26/37  Basic metabolic panel  Result Value Ref Range   Sodium 138 135 - 145 mmol/L   Potassium 4.1 3.5 - 5.1 mmol/L   Chloride 102 101 - 111 mmol/L   CO2 27 22 - 32 mmol/L   Glucose, Bld 119 (H) 65 - 99 mg/dL   BUN 11 6 - 20 mg/dL   Creatinine, Ser 0.53 (L) 0.61 - 1.24 mg/dL   Calcium 9.5 8.9 - 10.3 mg/dL   GFR calc non Af Amer >60 >60 mL/min   GFR calc Af Amer >60 >60 mL/min   Anion gap 9 5 - 15  CBC  Result Value Ref Range   WBC 13.1 (H) 3.8 - 10.6 K/uL   RBC 5.28 4.40 - 5.90 MIL/uL   Hemoglobin 15.8 13.0 - 18.0 g/dL   HCT 44.6 40.0 - 52.0 %   MCV 84.6 80.0 - 100.0 fL   MCH 29.9 26.0 - 34.0 pg   MCHC 35.4 32.0 - 36.0 g/dL   RDW 14.4 11.5 - 14.5 %   Platelets 218 150 - 440 K/uL  Troponin I  Result Value Ref Range   Troponin I <0.03 <0.03 ng/mL      Assessment & Plan:   Problem List Items Addressed This Visit      Other   Depression    Stable and under good control with elavil. Continue current regimen      Relevant Medications   amitriptyline (ELAVIL) 25 MG tablet   Chronic left hip pain - Primary    As well as neck and back pain. Is s/p left BKA, was previously well controlled with injections and oxycodone regimen in [place]. Will refer to pain management for evaluation asap as he's been out of all pain resources for over a month now and having a very hard time getting around. Restart meloxicam in the meantime.       Relevant Medications   Oxycodone HCl 10 MG TABS   meloxicam (MOBIC) 7.5 MG tablet   amitriptyline  (ELAVIL) 25 MG tablet   predniSONE (DELTASONE) 10 MG tablet   Other Relevant Orders   Ambulatory referral to Pain Clinic    Other Visit Diagnoses    Encounter to establish care  Weight gain       Reduce portions, simple carbohydrates, fried/fast foods. Increase activity as tolerated particularly once back on pain management.    Wheezing       Suspect some underlying COPD changes given hx and exam. Encouraged smoking cessation, will treat with prednisone, albuterol. Arlyce Harman once flare reduced       Follow up plan: Return in about 9 months (around 11/29/2017) for CPE.

## 2017-03-04 DIAGNOSIS — M25552 Pain in left hip: Principal | ICD-10-CM

## 2017-03-04 DIAGNOSIS — G8929 Other chronic pain: Secondary | ICD-10-CM | POA: Insufficient documentation

## 2017-03-04 NOTE — Assessment & Plan Note (Signed)
Stable and under good control with elavil. Continue current regimen

## 2017-03-04 NOTE — Assessment & Plan Note (Addendum)
As well as neck and back pain. Is s/p left BKA, was previously well controlled with injections and oxycodone regimen in [place]. Will refer to pain management for evaluation asap as he's been out of all pain resources for over a month now and having a very hard time getting around. Restart meloxicam in the meantime.

## 2017-03-04 NOTE — Patient Instructions (Signed)
Follow up for CPE 

## 2017-03-20 ENCOUNTER — Ambulatory Visit: Payer: Medicaid - Out of State | Admitting: Student in an Organized Health Care Education/Training Program

## 2017-03-29 ENCOUNTER — Encounter: Payer: Self-pay | Admitting: Student in an Organized Health Care Education/Training Program

## 2017-03-29 ENCOUNTER — Ambulatory Visit
Payer: Medicaid Other | Attending: Student in an Organized Health Care Education/Training Program | Admitting: Student in an Organized Health Care Education/Training Program

## 2017-03-29 ENCOUNTER — Other Ambulatory Visit: Payer: Self-pay

## 2017-03-29 VITALS — BP 138/96 | HR 110 | Temp 97.6°F | Resp 16 | Ht 72.0 in | Wt 335.0 lb

## 2017-03-29 DIAGNOSIS — G546 Phantom limb syndrome with pain: Secondary | ICD-10-CM | POA: Insufficient documentation

## 2017-03-29 DIAGNOSIS — Z833 Family history of diabetes mellitus: Secondary | ICD-10-CM | POA: Insufficient documentation

## 2017-03-29 DIAGNOSIS — Z888 Allergy status to other drugs, medicaments and biological substances status: Secondary | ICD-10-CM | POA: Diagnosis not present

## 2017-03-29 DIAGNOSIS — Z820 Family history of epilepsy and other diseases of the nervous system: Secondary | ICD-10-CM | POA: Insufficient documentation

## 2017-03-29 DIAGNOSIS — Z79899 Other long term (current) drug therapy: Secondary | ICD-10-CM | POA: Insufficient documentation

## 2017-03-29 DIAGNOSIS — Z8249 Family history of ischemic heart disease and other diseases of the circulatory system: Secondary | ICD-10-CM | POA: Diagnosis not present

## 2017-03-29 DIAGNOSIS — M545 Low back pain: Secondary | ICD-10-CM | POA: Diagnosis present

## 2017-03-29 DIAGNOSIS — M25512 Pain in left shoulder: Secondary | ICD-10-CM | POA: Insufficient documentation

## 2017-03-29 DIAGNOSIS — F329 Major depressive disorder, single episode, unspecified: Secondary | ICD-10-CM | POA: Diagnosis not present

## 2017-03-29 DIAGNOSIS — M25552 Pain in left hip: Secondary | ICD-10-CM | POA: Diagnosis not present

## 2017-03-29 DIAGNOSIS — G894 Chronic pain syndrome: Secondary | ICD-10-CM | POA: Insufficient documentation

## 2017-03-29 DIAGNOSIS — F17210 Nicotine dependence, cigarettes, uncomplicated: Secondary | ICD-10-CM | POA: Diagnosis not present

## 2017-03-29 DIAGNOSIS — Z89512 Acquired absence of left leg below knee: Secondary | ICD-10-CM | POA: Diagnosis not present

## 2017-03-29 DIAGNOSIS — M542 Cervicalgia: Secondary | ICD-10-CM | POA: Diagnosis not present

## 2017-03-29 DIAGNOSIS — F410 Panic disorder [episodic paroxysmal anxiety] without agoraphobia: Secondary | ICD-10-CM | POA: Diagnosis not present

## 2017-03-29 MED ORDER — GABAPENTIN 600 MG PO TABS: ORAL_TABLET | ORAL | 1 refills | Status: DC

## 2017-03-29 MED ORDER — TIZANIDINE HCL 4 MG PO TABS: 4.0000 mg | ORAL_TABLET | Freq: Three times a day (TID) | ORAL | 1 refills | Status: DC | PRN

## 2017-03-29 NOTE — Progress Notes (Signed)
Patient's Name: [name] Day.  MRN: [id number]  Referring Provider: Volney American,*  DOB: 1960/04/17  PCP: Volney American, PA-C  DOS: 03/29/2017  Note by: Gillis Santa, MD  Service setting: Ambulatory outpatient  Specialty: Interventional Pain Management  Location: [facility] (AMB) Pain Management Facility  Visit type: Initial Patient Evaluation  Patient type: New Patient   Primary Reason(s) for Visit: Encounter for initial evaluation of one or more chronic problems (new to examiner) potentially causing chronic pain, and posing a threat to normal musculoskeletal function. (Level of risk: High) CC: Back Pain (lower back left); Hip Pain (left); Leg Pain (left below knee amputation); Neck Pain; and Shoulder Pain (left )  HPI  [name] Day is a 56 y.o. year old, male patient, who comes today to see [place] for the first time for an initial evaluation of his chronic pain. He has Depression; Chronic left hip pain; S/P BKA (below knee amputation) unilateral, left ([place]); Chronic pain syndrome; and 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HCC) on their problem list. Today he comes in for evaluation of his Back Pain (lower back left); Hip Pain (left); Leg Pain (left below knee amputation); Neck Pain; and Shoulder Pain (left )  Pain Assessment: Location: Left Leg(and left hip) Radiating: shoulder pain goes into the arm and decreases ROM.  left leg pain hurts upon ambulation and causes hip to hurt Onset: More than a month ago Duration: Chronic pain Quality: Throbbing, Discomfort(every time he takes a step it feels like slapping your hand into a door on the left at the nub.  throbbing causes him to have to remove prosthesis d/t swelling) Severity: 7 /10 (self-reported pain score)  Note: Reported level is inconsistent with clinical observations. Clinically the patient looks like a 2/10 A 2/10 is viewed as "Mild to Moderate" and described as noticeable and distracting. Impossible to  hide from other people. More frequent flare-ups. Still possible to adapt and function close to normal. It can be very annoying and may have occasional stronger flare-ups. With discipline, patients may get used to it and adapt.       When using our objective Pain Scale, levels between 6 and 10/10 are said to belong in an emergency room, as it progressively worsens from a 6/10, described as severely limiting, requiring emergency care not usually available at an outpatient pain management facility. At a 6/10 level, communication becomes difficult and requires great effort. Assistance to reach the emergency department may be required. Facial flushing and profuse sweating along with potentially dangerous increases in heart rate and blood pressure will be evident. Effect on ADL: difficulty walkinig without pain, limited ROM in shoulders Timing: Intermittent Modifying factors: rest, medication, PT helped  Onset and Duration: Sudden and Date of onset: 12/23/11 Cause of pain: Work related accident or event Severity: NAS-11 at its worse: 8/10, NAS-11 at its best: 3/10, NAS-11 now: 7/10 and NAS-11 on the average: 6/10 Timing: Not influenced by the time of the day, During activity or exercise and After activity or exercise Aggravating Factors: Bending, Climbing, Kneeling, Lifiting, Motion, Prolonged standing, Squatting, Stooping , Twisting, Walking, Walking uphill, Walking downhill and Working Alleviating Factors: Lying down, Medications, Resting, Sitting and Sleeping Associated Problems: Numbness, Personality changes, Sadness, Pain that wakes patient up and Pain that does not allow patient to sleep Quality of Pain: Exhausting, Throbbing and Toothache-like Previous Examinations or Tests: CT scan, MRI scan and X-rays Previous Treatments: Narcotic medications, Physical Therapy and Trigger point injections  The patient comes  into the clinics today for the first time for a chronic pain management evaluation.    Very pleasant 56 year old male who presents with left stump pain and left phantom pain status post left below the knee amputation in 2013 secondary to sequelae from traumatic fall that he had in the remote past.  Patient was previously under the care of a pain management physician in [place].  He states that he has had neuroma injections which have not been very helpful.  He has a prosthetic in place.  He states that before his stump used to swell and was very irritating and itchy.  This has improved.  Patient has tried various medications including gabapentin which he finds most effective at 1200 mg twice daily.  Has tried Lyrica which was not effective.  Patient has not tried Cymbalta.  Has tried Flexeril which made him sedated.  Patient's previous opioid therapy included Percocet 10 mg 3 times daily as needed.  He has not been on opioid therapy since October when he moved from [place] to [place].  Today I took the time to provide the patient with information regarding my pain practice. The patient was informed that my practice is divided into two sections: an interventional pain management section, as well as a completely separate and distinct medication management section. I explained that I have procedure days for my interventional therapies, and evaluation days for follow-ups and medication management. Because of the amount of documentation required during both, they are kept separated. This means that there is the possibility that he may be scheduled for a procedure on one day, and medication management the next. I have also informed him that because of staffing and facility limitations, I no longer take patients for medication management only. To illustrate the reasons for this, I gave the patient the example of surgeons, and how inappropriate it would be to refer a patient to his/her care, just to write for the post-surgical antibiotics on a surgery done by a different surgeon.   Because  interventional pain management is my board-certified specialty, the patient was informed that joining my practice means that they are open to any and all interventional therapies. I made it clear that this does not mean that they will be forced to have any procedures done. What this means is that I believe interventional therapies to be essential part of the diagnosis and proper management of chronic pain conditions. Therefore, patients not interested in these interventional alternatives will be better served under the care of a different practitioner.  The patient was also made aware of my Comprehensive Pain Management Safety Guidelines where by joining my practice, they limit all of their nerve blocks and joint injections to those done by our practice, for as long as we are retained to manage their care.   Historic Controlled Substance Pharmacotherapy Review  PMP and historical list of controlled substances: Oxycodone 5 mg, quantity 8, last filled 01/04/2017.  Previous chronic pain regimen does consisted of Percocet 10 mg 3 times daily as needed.  This equates to an MME of approximately 45.  Currently not on any opioid medications.  Medications: The patient did not bring the medication(s) to the appointment, as requested in our "New Patient Package" Pharmacodynamics: Desired effects: Analgesia: The patient reports >50% benefit. Reported improvement in function: The patient reports medication allows him to accomplish basic ADLs. Clinically meaningful improvement in function (CMIF): Sustained CMIF goals met Perceived effectiveness: Described as relatively effective, allowing for increase in activities of daily  living (ADL) Undesirable effects: Side-effects or Adverse reactions: None reported Historical Monitoring: The patient  reports that he does not use drugs. List of all UDS Test(s): No results found for: MDMA, COCAINSCRNUR, [place], [place], CANNABQUANT, [place], [place] List of other Serum/Urine  Drug Screening Test(s):  No results found for: AMPHSCRSER, BARBSCRSER, BENZOSCRSER, COCAINSCRSER, COCAINSCRNUR, PCPSCRSER, PCPQUANT, THCSCRSER, THCU, CANNABQUANT, OPIATESCRSER, OXYSCRSER, PROPOXSCRSER, ETH Historical Background Evaluation: [place] PMP: Six (6) year initial data search conducted.             [place] Department of public safety, offender search: Editor, commissioning Information) Non-contributory Risk Assessment Profile: Aberrant behavior: None observed or detected today Risk factors for fatal opioid overdose: None identified today Fatal overdose hazard ratio (HR): Calculation deferred Non-fatal overdose hazard ratio (HR): Calculation deferred Risk of opioid abuse or dependence: 0.7-3.0% with doses ? 36 MME/day and 6.1-26% with doses ? 120 MME/day. Substance use disorder (SUD) risk level: Pending results of Medical Psychology Evaluation for SUD Opioid risk tool (ORT) (Total Score): 0 Opioid Risk Tool - 03/29/17 1129      Family History of Substance Abuse   Alcohol  Negative    Illegal Drugs  Negative    Rx Drugs  Negative      Personal History of Substance Abuse   Alcohol  Negative    Illegal Drugs  Negative    Rx Drugs  Negative      Psychological Disease   Psychological Disease  Negative    Depression  Negative      Total Score   Opioid Risk Tool Scoring  0    Opioid Risk Interpretation  Low Risk      ORT Scoring interpretation table:  Score <3 = Low Risk for SUD  Score between 4-7 = Moderate Risk for SUD  Score >8 = High Risk for Opioid Abuse   PHQ-2 Depression Scale:  Total score: 0  PHQ-2 Scoring interpretation table: (Score and probability of major depressive disorder)  Score 0 = No depression  Score 1 = 15.4% Probability  Score 2 = 21.1% Probability  Score 3 = 38.4% Probability  Score 4 = 45.5% Probability  Score 5 = 56.4% Probability  Score 6 = 78.6% Probability   PHQ-9 Depression Scale:  Total score: 0  PHQ-9 Scoring interpretation table:  Score 0-4 = No  depression  Score 5-9 = Mild depression  Score 10-14 = Moderate depression  Score 15-19 = Moderately severe depression  Score 20-27 = Severe depression (2.4 times higher risk of SUD and 2.89 times higher risk of overuse)   Pharmacologic Plan: Pending ordered tests and/or consults            Initial impression: Pending review of available data and ordered tests.  Meds   Current Outpatient Medications:  .  albuterol (PROVENTIL HFA;VENTOLIN HFA) 108 (90 Base) MCG/ACT inhaler, Inhale 2 puffs into the lungs every 6 (six) hours as needed for wheezing or shortness of breath., Disp: 1 Inhaler, Rfl: 2 .  amitriptyline (ELAVIL) 25 MG tablet, TAKE 1 TABLET BY MOUTH EVERYDAY AT BEDTIME, Disp: 30 tablet, Rfl: 5 .  betamethasone dipropionate (DIPROLENE) 0.05 % cream, Apply topically 2 (two) times daily., Disp: 30 g, Rfl: 3 .  gabapentin (NEURONTIN) 600 MG tablet, 1200 mg qhs x 1 week and then BID, Disp: 120 tablet, Rfl: 1 .  meloxicam (MOBIC) 7.5 MG tablet, Take 1 tablet (7.5 mg total) by mouth daily. (Patient not taking: Reported on 03/29/2017), Disp: 30 tablet, Rfl: 5 .  Oxycodone HCl 10  MG TABS, TAKE 1 TABLET BY MOUTH EVERY 8 HOURS AS NEEDED FOR NON-ACUTE PAIN, Disp: , Rfl: 0 .  predniSONE (DELTASONE) 10 MG tablet, Take 6 tabs day one, 5 tabs day two, 4 tabs day three, etc (Patient not taking: Reported on 03/29/2017), Disp: 21 tablet, Rfl: 0 .  tiZANidine (ZANAFLEX) 4 MG tablet, Take 1 tablet (4 mg total) by mouth every 8 (eight) hours as needed for muscle spasms., Disp: 90 tablet, Rfl: 1   ROS  Cardiovascular History: No reported cardiovascular signs or symptoms such as High blood pressure, coronary artery disease, abnormal heart rate or rhythm, heart attack, blood thinner therapy or heart weakness and/or failure Pulmonary or Respiratory History: Snoring  Neurological History: No reported neurological signs or symptoms such as seizures, abnormal skin sensations, urinary and/or fecal incontinence,  being born with an abnormal open spine and/or a tethered spinal cord Review of Past Neurological Studies: No results found for this or any previous visit. Psychological-Psychiatric History: No reported psychological or psychiatric signs or symptoms such as difficulty sleeping, anxiety, depression, delusions or hallucinations (schizophrenial), mood swings (bipolar disorders) or suicidal ideations or attempts Gastrointestinal History: Heartburn due to stomach pushing into lungs (Hiatal hernia) Genitourinary History: No reported renal or genitourinary signs or symptoms such as difficulty voiding or producing urine, peeing blood, non-functioning kidney, kidney stones, difficulty emptying the bladder, difficulty controlling the flow of urine, or chronic kidney disease Hematological History: No reported hematological signs or symptoms such as prolonged bleeding, low or poor functioning platelets, bruising or bleeding easily, hereditary bleeding problems, low energy levels due to low hemoglobin or being anemic Endocrine History: No reported endocrine signs or symptoms such as high or low blood sugar, rapid heart rate due to high thyroid levels, obesity or weight gain due to slow thyroid or thyroid disease Rheumatologic History: No reported rheumatological signs and symptoms such as fatigue, joint pain, tenderness, swelling, redness, heat, stiffness, decreased range of motion, with or without associated rash Musculoskeletal History: Negative for myasthenia gravis, muscular dystrophy, multiple sclerosis or malignant hyperthermia Work History: Out of work due to pain  Allergies  Mr. Lopes is allergic to tramadol.  Laboratory Chemistry  Inflammation Markers (CRP: Acute Phase) (ESR: Chronic Phase) No results found for: CRP, ESRSEDRATE, LATICACIDVEN               Rheumatology Markers No results found for: RF, ANA, LABURIC, URICUR, LYMEIGGIGMAB, [facility]              Renal Function Markers Lab  Results  Component Value Date   BUN 11 01/01/2017   CREATININE 0.53 (L) 01/01/2017   GFRAA >60 01/01/2017   GFRNONAA >60 01/01/2017                 Hepatic Function Markers No results found for: AST, ALT, ALBUMIN, ALKPHOS, HCVAB, AMYLASE, LIPASE, AMMONIA               Electrolytes Lab Results  Component Value Date   NA 138 01/01/2017   K 4.1 01/01/2017   CL 102 01/01/2017   CALCIUM 9.5 01/01/2017                 Neuropathy Markers No results found for: VITAMINB12, FOLATE, HGBA1C, HIV               Bone Pathology Markers No results found for: [place], [id number], [id number], [id number], 25OHVITD1, 25OHVITD2, 25OHVITD3, TESTOFREE, TESTOSTERONE               Coagulation  Parameters Lab Results  Component Value Date   PLT 218 01/01/2017                 Cardiovascular Markers Lab Results  Component Value Date   CKTOTAL 135 04/07/2012   CKMB 0.7 04/07/2012   TROPONINI <0.03 01/01/2017   HGB 15.8 01/01/2017   HCT 44.6 01/01/2017                 CA Markers No results found for: CEA, CA125, LABCA2               Note: Lab results reviewed.  PFSH  Drug: Mr. Main  reports that he does not use drugs. Alcohol:  reports that he does not drink alcohol. Tobacco:  reports that he has been smoking cigarettes.  He has been smoking about 0.50 packs per day. He has quit using smokeless tobacco. Medical:  has a past medical history of Depression and Panic attack. Family: family history includes Alzheimer's disease in his maternal grandmother; Cancer in his maternal grandfather, mother, and paternal grandmother; Diabetes in his father; Heart disease in his father; Hypertension in his father.  Past Surgical History:  Procedure Laterality Date  . HAND SURGERY    . LEG AMPUTATION    . LEG SURGERY     Active Ambulatory Problems    Diagnosis Date Noted  . Depression 03/04/2017  . Chronic left hip pain 03/04/2017  . S/P BKA (below knee amputation) unilateral, left ([place]) 03/29/2017   . Chronic pain syndrome 03/29/2017  . 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[place]) 03/29/2017   Resolved Ambulatory Problems    Diagnosis Date Noted  . No Resolved Ambulatory Problems   Past Medical History:  Diagnosis Date  . Depression   . Panic attack    Constitutional Exam  General appearance: Well nourished, well developed, and well hydrated. In no apparent acute distress Vitals:   03/29/17 1117  BP: (!) 138/96  Pulse: (!) 110  Resp: 16  Temp: 97.6 F (36.4 C)  TempSrc: Oral  SpO2: 96%  Weight: (!) 335 lb (152 kg)  Height: 6' (1.829 m)   BMI Assessment: Estimated body mass index is 45.43 kg/m as calculated from the following:   Height as of this encounter: 6' (1.829 m).   Weight as of this encounter: 335 lb (152 kg).  BMI interpretation table: BMI level Category Range association with higher incidence of chronic pain  <18 kg/m2 Underweight   18.5-24.9 kg/m2 Ideal body weight   25-29.9 kg/m2 Overweight Increased incidence by 20%  30-34.9 kg/m2 Obese (Class I) Increased incidence by 68%  35-39.9 kg/m2 Severe obesity (Class II) Increased incidence by 136%  >40 kg/m2 Extreme obesity (Class III) Increased incidence by 254%   BMI Readings from Last 4 Encounters:  03/29/17 45.43 kg/m  03/01/17 45.84 kg/m  01/01/17 42.86 kg/m   Wt Readings from Last 4 Encounters:  03/29/17 (!) 335 lb (152 kg)  03/01/17 (!) 338 lb (153.3 kg)  01/01/17 (!) 316 lb (143.3 kg)  Psych/Mental status: Alert, oriented x 3 (person, place, & time)       Eyes: PERLA Respiratory: No evidence of acute respiratory distress  Cervical Spine Area Exam  Skin & Axial Inspection: No masses, redness, edema, swelling, or associated skin lesions Alignment: Symmetrical Functional ROM: Unrestricted ROM      Stability: No instability detected Muscle Tone/Strength: Functionally intact. No obvious neuro-muscular anomalies detected. Sensory (Neurological): Unimpaired Palpation: No palpable  anomalies  Upper Extremity (UE) Exam    Side: Right upper extremity  Side: Left upper extremity  Skin & Extremity Inspection: Skin color, temperature, and hair growth are WNL. No peripheral edema or cyanosis. No masses, redness, swelling, asymmetry, or associated skin lesions. No contractures.  Skin & Extremity Inspection: Skin color, temperature, and hair growth are WNL. No peripheral edema or cyanosis. No masses, redness, swelling, asymmetry, or associated skin lesions. No contractures.  Functional ROM: Unrestricted ROM          Functional ROM: Unrestricted ROM          Muscle Tone/Strength: Functionally intact. No obvious neuro-muscular anomalies detected.  Muscle Tone/Strength: Functionally intact. No obvious neuro-muscular anomalies detected.  Sensory (Neurological): Unimpaired          Sensory (Neurological): Unimpaired          Palpation: No palpable anomalies              Palpation: No palpable anomalies              Specialized Test(s): Deferred         Specialized Test(s): Deferred          Thoracic Spine Area Exam  Skin & Axial Inspection: No masses, redness, or swelling Alignment: Symmetrical Functional ROM: Unrestricted ROM Stability: No instability detected Muscle Tone/Strength: Functionally intact. No obvious neuro-muscular anomalies detected. Sensory (Neurological): Unimpaired Muscle strength & Tone: No palpable anomalies  Lumbar Spine Area Exam  Skin & Axial Inspection: No masses, redness, or swelling Alignment: Symmetrical Functional ROM: Unrestricted ROM      Stability: No instability detected Muscle Tone/Strength: Functionally intact. No obvious neuro-muscular anomalies detected. Sensory (Neurological): Unimpaired Palpation: No palpable anomalies       Provocative Tests: Lumbar Hyperextension and rotation test: evaluation deferred today       Lumbar Lateral bending test: evaluation deferred today       Patrick's Maneuver: Positive for left-sided S-I  arthralgia              Gait & Posture Assessment  Ambulation: Limited Gait: Left leg prosthesis in place. Posture: Difficulty with positional changes   Lower Extremity Exam    Side: Right lower extremity  Side: Left lower extremity  Skin & Extremity Inspection: Skin color, temperature, and hair growth are WNL. No peripheral edema or cyanosis. No masses, redness, swelling, asymmetry, or associated skin lesions. No contractures.  Skin & Extremity Inspection: Below knee amputation (BKA)  Functional ROM: Unrestricted ROM          Functional ROM: Unrestricted ROM          Muscle Tone/Strength: Functionally intact. No obvious neuro-muscular anomalies detected.  Muscle Tone/Strength: Functionally intact. No obvious neuro-muscular anomalies detected.  Sensory (Neurological): Unimpaired  Sensory (Neurological): Neurogenic pain pattern  Palpation: No palpable anomalies  Palpation: Tender  Allodynia, stump pain, phantom pain at left stump site.  No erythema or discharge noted.  Assessment  Primary Diagnosis & Pertinent Problem List: The primary encounter diagnosis was S/P BKA (below knee amputation) unilateral, left ([place]). Diagnoses of Left hip pain, Chronic pain syndrome, and 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[place]) were also pertinent to this visit.  Visit Diagnosis (New problems to examiner): 1. S/P BKA (below knee amputation) unilateral, left ([place])   2. Left hip pain   3. Chronic pain syndrome   4. 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[place])    56 year old male who presents as a new patient for left stump pain, left  phantom pain status post left below the knee amputation performed in 2013.  Patient also has radiating left hip pain as well.  In regards to his medications, patient was on gabapentin 1200 mg twice daily along with Percocet 10 mg 3 times daily as needed at his previous pain clinic in [place].  He has moved to [place] back in October and is establishing  care with new physicians in the area.  Since the patient has already had neuroma injections which have not been helpful, we will not plan on repeating these.  I will complete urine drug screen today which should be negative for all controlled or illicit substances and send the patient for psychological evaluation for risk of substance abuse disorder.  In the meantime the patient can restart gabapentin 1200 mg twice daily.  Also prescribe him tizanidine 4 mg 3 times daily as needed for his left lower extremity stump spasms.  In regards to his hip pain, this could be secondary to increased pressure over his acetabulum related to adjustment of his prosthesis.  Patient does have positive Patrick's and Faber's test on the left suggesting SI joint pathology.  We discussed left diagnostic sacroiliac joint injection in the future.  We also discussed various options for his phantom pain which includes mirror therapy.  Patient wants to hold off on this at this time.  Plan: -UDS today -Referral to pain psychology regarding risk of substance abuse disorder/misuse -Tizanidine 4 mg 3 times daily as needed left lower extremity stump spasms -Gabapentin 1200 mg twice daily -Consideration of mirror therapy for left phantom limb pain. -Referral to physical therapy at next visit. -Consideration of left sacroiliac joint injection  Ordered Lab-work, Procedure(s), Referral(s), & Consult(s): Orders Placed This Encounter  Procedures  . Compliance Drug Analysis, Ur  . Ambulatory referral to Psychology   Pharmacotherapy (current): Medications ordered:  Meds ordered this encounter  Medications  . gabapentin (NEURONTIN) 600 MG tablet    Sig: 1200 mg qhs x 1 week and then BID    Dispense:  120 tablet    Refill:  1    Do not place this medication, or any other prescription from our practice, on "Automatic Refill". Patient may have prescription filled one day early if pharmacy is closed on scheduled refill date.  Marland Kitchen  tiZANidine (ZANAFLEX) 4 MG tablet    Sig: Take 1 tablet (4 mg total) by mouth every 8 (eight) hours as needed for muscle spasms.    Dispense:  90 tablet    Refill:  1   Medications administered during this visit: [name] had no medications administered during this visit.   Pharmacological management options:  Opioid Analgesics: The patient was informed that there is no guarantee that he would be a candidate for opioid analgesics. The decision will be made following CDC guidelines. This decision will be based on the results of diagnostic studies, as well as Mr. Tingler's risk profile.   Membrane stabilizer: Gabapentin 1200 mg twice daily.  Has not tried Cymbalta, amitriptyline.  Muscle relaxant: Has tried Flexeril in the past.  Prescription for tizanidine today.  NSAID: Consider Mobic, diclofenac  Other analgesic(s): To be determined at a later time   Interventional management options: Mr. Shadd was informed that there is no guarantee that he would be a candidate for interventional therapies. The decision will be based on the results of diagnostic studies, as well as Mr. Lux's risk profile.  Procedure(s) under consideration:  -Left SI joint block for left  hip pain.   Provider-requested follow-up: Return in about 4 weeks (around 04/26/2017) for After Psychological evaluation, Medication Management.  Future Appointments  Date Time Provider [place]  04/18/2017 12:15 PM Gillis Santa, MD [facility] None  12/05/2017  8:30 AM Volney American, PA-C [facility]    Primary Care Physician: Volney American, PA-C Location: [facility] Facility Note by: Gillis Santa, M.D, Date: 03/29/2017; Time: 3:48 PM  Patient Instructions  1. UDS today 2. Pain Psych for SUD 3. Gabapentin 1200 mg twice daily. Start 1200 mg qhs and then increase to 1200 mg twice daily 4. Please sign release of info for medical records from [facility]. I need to see them 5. Tizanidine 4 mg up to three times a day as needed for muscle spasms

## 2017-03-29 NOTE — Progress Notes (Signed)
Safety precautions to be maintained throughout the outpatient stay will include: orient to surroundings, keep bed in low position, maintain call bell within reach at all times, provide assistance with transfer out of bed and ambulation.  

## 2017-03-29 NOTE — Patient Instructions (Signed)
1. UDS today 2. Pain Psych for SUD 3. Gabapentin 1200 mg twice daily. Start 1200 mg qhs and then increase to 1200 mg twice daily 4. Please sign release of info for medical records from [facility]. I need to see them 5. Tizanidine 4 mg up to three times a day as needed for muscle spasms

## 2017-04-05 LAB — COMPLIANCE DRUG ANALYSIS, UR

## 2017-04-18 ENCOUNTER — Other Ambulatory Visit: Payer: Self-pay

## 2017-04-18 ENCOUNTER — Encounter: Payer: Self-pay | Admitting: Student in an Organized Health Care Education/Training Program

## 2017-04-18 ENCOUNTER — Ambulatory Visit
Payer: Medicaid Other | Attending: Student in an Organized Health Care Education/Training Program | Admitting: Student in an Organized Health Care Education/Training Program

## 2017-04-18 VITALS — BP 147/95 | HR 101 | Temp 97.7°F | Resp 18 | Ht 72.0 in | Wt 323.0 lb

## 2017-04-18 DIAGNOSIS — F329 Major depressive disorder, single episode, unspecified: Secondary | ICD-10-CM | POA: Diagnosis not present

## 2017-04-18 DIAGNOSIS — Z79899 Other long term (current) drug therapy: Secondary | ICD-10-CM | POA: Insufficient documentation

## 2017-04-18 DIAGNOSIS — M25552 Pain in left hip: Secondary | ICD-10-CM | POA: Diagnosis not present

## 2017-04-18 DIAGNOSIS — Z89512 Acquired absence of left leg below knee: Secondary | ICD-10-CM | POA: Insufficient documentation

## 2017-04-18 DIAGNOSIS — M461 Sacroiliitis, not elsewhere classified: Secondary | ICD-10-CM | POA: Insufficient documentation

## 2017-04-18 DIAGNOSIS — F17210 Nicotine dependence, cigarettes, uncomplicated: Secondary | ICD-10-CM | POA: Insufficient documentation

## 2017-04-18 DIAGNOSIS — M79605 Pain in left leg: Secondary | ICD-10-CM | POA: Insufficient documentation

## 2017-04-18 DIAGNOSIS — G894 Chronic pain syndrome: Secondary | ICD-10-CM | POA: Insufficient documentation

## 2017-04-18 DIAGNOSIS — G546 Phantom limb syndrome with pain: Secondary | ICD-10-CM | POA: Diagnosis not present

## 2017-04-18 DIAGNOSIS — M4698 Unspecified inflammatory spondylopathy, sacral and sacrococcygeal region: Secondary | ICD-10-CM

## 2017-04-18 DIAGNOSIS — M47818 Spondylosis without myelopathy or radiculopathy, sacral and sacrococcygeal region: Secondary | ICD-10-CM

## 2017-04-18 MED ORDER — OXYCODONE HCL 10 MG PO TABS: 10.0000 mg | ORAL_TABLET | Freq: Four times a day (QID) | ORAL | 0 refills | Status: DC | PRN

## 2017-04-18 MED ORDER — GABAPENTIN 600 MG PO TABS: ORAL_TABLET | ORAL | 1 refills | Status: DC

## 2017-04-18 MED ORDER — TIZANIDINE HCL 4 MG PO TABS: 4.0000 mg | ORAL_TABLET | Freq: Three times a day (TID) | ORAL | 3 refills | Status: DC | PRN

## 2017-04-18 NOTE — Progress Notes (Signed)
Patient's Name: [name] Day.  MRN: [id number]  Referring Provider: Volney American,*  DOB: Jul 07, 1960  PCP: Volney American, PA-C  DOS: 04/18/2017  Note by: Gillis Santa, MD  Service setting: Ambulatory outpatient  Specialty: Interventional Pain Management  Location: [facility] (AMB) Pain Management Facility    Patient type: Established   Primary Reason(s) for Visit: Encounter for evaluation before starting new chronic pain management plan of care (Level of risk: moderate) CC: Leg Pain and Hip Pain  HPI  [name] is a 57 y.o. year old, male patient, who comes today for a follow-up evaluation to review the test results and decide on a treatment plan. He has Depression; Chronic left hip pain; S/P BKA (below knee amputation) unilateral, left ([place]); Chronic pain syndrome; and 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HCC) on their problem list. His primarily concern today is the Leg Pain and Hip Pain  Pain Assessment: Location: Left Leg Radiating: radiates up and down from stump Onset: More than a month ago Duration: Chronic pain Quality: Throbbing, Discomfort, Pounding, Constant Severity: 8 /10 (self-reported pain score)  Note: Reported level is inconsistent with clinical observations. Clinically the patient looks like a 2/10 A 2/10 is viewed as "Mild to Moderate" and described as noticeable and distracting. Impossible to hide from other people. More frequent flare-ups. Still possible to adapt and function close to normal. It can be very annoying and may have occasional stronger flare-ups. With discipline, patients may get used to it and adapt.       When using our objective Pain Scale, levels between 6 and 10/10 are said to belong in an emergency room, as it progressively worsens from a 6/10, described as severely limiting, requiring emergency care not usually available at an outpatient pain management facility. At a 6/10 level, communication becomes difficult and  requires great effort. Assistance to reach the emergency department may be required. Facial flushing and profuse sweating along with potentially dangerous increases in heart rate and blood pressure will be evident. Effect on ADL: difficulty ambulating Timing: Constant Modifying factors: elevation, rest  [name] comes in today for a follow-up visit after his initial evaluation on 03/29/2017. Today we went over the results of his tests. These were explained in "Layman's terms". During today's appointment we went over my diagnostic impression, as well as the proposed treatment plan.   Patient presents today for follow-up.  Was unable to get his previous prescriptions filled due to mix up at pharmacy.  Patient has brought his prior records from [place] pain clinic.  He has also seen pain psychology and is deemed low risk for opioid misuse abuse.  We will have the patient sign his opioid contract and continue his previous opiate regimen of oxycodone 10 mg 4 times daily as needed severe pain.   In considering the treatment plan options, [name] was reminded that I no longer take patients for medication management only. I asked him to let me know if he had no intention of taking advantage of the interventional therapies, so that we could make arrangements to provide this space to someone interested. I also made it clear that undergoing interventional therapies for the purpose of getting pain medications is very inappropriate on the part of a patient, and it will not be tolerated in this practice. This type of behavior would suggest true addiction and therefore it requires referral to an addiction specialist.   Further details on both, my assessment(s), as well as the proposed treatment  plan, please see below.  Controlled Substance Pharmacotherapy Assessment REMS (Risk Evaluation and Mitigation Strategy)  Analgesic: Oxycodone 10 mg 4 times daily as needed, quantity 74-monthMME/day: 60  mg/day. Pill Count: None expected due to no prior prescriptions written by our practice. No notes on file Pharmacokinetics: Liberation and absorption (onset of action): WNL Distribution (time to peak effect): WNL Metabolism and excretion (duration of action): WNL         Pharmacodynamics: Desired effects: Analgesia: Mr. SMarengoreports >50% benefit. Functional ability: Patient reports that medication allows him to accomplish basic ADLs Clinically meaningful improvement in function (CMIF): Sustained CMIF goals met Perceived effectiveness: Described as relatively effective, allowing for increase in activities of daily living (ADL) Undesirable effects: Side-effects or Adverse reactions: None reported Monitoring: [place] PMP: Online review of the past 134-montheriod previously conducted. Not applicable at this point since we have not taken over the patient's medication management yet. List of other Serum/Urine Drug Screening Test(s):  No results found for: AMPHSCRSER, BARBSCRSER, BENZOSCRSER, COCAINSCRSER, COCAINSCRNUR, PCPSCRSER, THCSCRSER, THCU, CANNABQUANT, [place] of all UDS test(s) done:  Lab Results  Component Value Date   SUMMARY FINAL 03/29/2017   Last UDS on record: Summary  Date Value Ref Range Status  03/29/2017 FINAL  Final    Comment:    ==================================================================== TOXASSURE COMP DRUG ANALYSIS,UR ==================================================================== Test                             Result       Flag       Units Drug Present not Declared for Prescription Verification   Acetaminophen                  PRESENT      UNEXPECTED Drug Absent but Declared for Prescription Verification   Oxycodone                      Not Detected UNEXPECTED ng/mg creat   Gabapentin                     Not Detected UNEXPECTED   Tizanidine                     Not Detected UNEXPECTED    Tizanidine, as  indicated in the declared medication list, is not    always detected even when used as directed.   Amitriptyline                  Not Detected UNEXPECTED ==================================================================== Test                      Result    Flag   Units      Ref Range   Creatinine              131              mg/dL      >=20 ==================================================================== Declared Medications:  The flagging and interpretation on this report are based on the  following declared medications.  Unexpected results may arise from  inaccuracies in the declared medications.  **Note: The testing scope of this panel includes these medications:  Amitriptyline  Gabapentin  Oxycodone  **Note: The testing scope of this panel does not include small to  moderate amounts of these reported medications:  Tizanidine  **Note: The testing scope of this panel does  not include following  reported medications:  Albuterol  Betamethasone  Meloxicam  Prednisone ==================================================================== For clinical consultation, please call [phone]. ====================================================================    UDS interpretation: No unexpected findings.          Medication Assessment Form: Patient introduced to form today Treatment compliance: Treatment may start today if patient agrees with proposed plan. Evaluation of compliance is not applicable at this point Risk Assessment Profile: Aberrant behavior: See initial evaluations. None observed or detected today Comorbid factors increasing risk of overdose: See initial evaluation. No additional risks detected today Medical Psychology Evaluation: Please see scanned results in medical record.  Low risk Opioid Risk Tool - 03/29/17 1129      Family History of Substance Abuse   Alcohol  Negative    Illegal Drugs  Negative    Rx Drugs  Negative      Personal History of  Substance Abuse   Alcohol  Negative    Illegal Drugs  Negative    Rx Drugs  Negative      Psychological Disease   Psychological Disease  Negative    Depression  Negative      Total Score   Opioid Risk Tool Scoring  0    Opioid Risk Interpretation  Low Risk      ORT Scoring interpretation table:  Score <3 = Low Risk for SUD  Score between 4-7 = Moderate Risk for SUD  Score >8 = High Risk for Opioid Abuse   Risk Mitigation Strategies:  Patient opioid safety counseling: Completed today. Counseling provided to patient as per "Patient Counseling Document". Document signed by patient, attesting to counseling and understanding Patient-Prescriber Agreement (PPA): Obtained today.  Controlled substance notification to other providers: Written and sent today.  Pharmacologic Plan: Today we may be taking over the patient's pharmacological regimen. See below.             Laboratory Chemistry  Inflammation Markers (CRP: Acute Phase) (ESR: Chronic Phase) No results found for: CRP, ESRSEDRATE, LATICACIDVEN               Rheumatology Markers No results found for: RF, ANA, LABURIC, URICUR, LYMEIGGIGMAB, [facility]              Renal Function Markers Lab Results  Component Value Date   BUN 11 01/01/2017   CREATININE 0.53 (L) 01/01/2017   GFRAA >60 01/01/2017   GFRNONAA >60 01/01/2017                 Hepatic Function Markers No results found for: AST, ALT, ALBUMIN, ALKPHOS, HCVAB, AMYLASE, LIPASE, AMMONIA               Electrolytes Lab Results  Component Value Date   NA 138 01/01/2017   K 4.1 01/01/2017   CL 102 01/01/2017   CALCIUM 9.5 01/01/2017                 Neuropathy Markers No results found for: VITAMINB12, FOLATE, HGBA1C, HIV               Bone Pathology Markers No results found for: VD25OH, [id number], [id number], QI2979GX2, 25OHVITD1, 25OHVITD2, 25OHVITD3, TESTOFREE, TESTOSTERONE               Coagulation Parameters Lab Results  Component Value Date   PLT 218  01/01/2017                 Cardiovascular Markers Lab Results  Component Value Date   CKTOTAL 135 04/07/2012   CKMB  0.7 04/07/2012   TROPONINI <0.03 01/01/2017   HGB 15.8 01/01/2017   HCT 44.6 01/01/2017                 CA Markers No results found for: CEA, CA125, LABCA2               Note: Lab results reviewed.  Recent Diagnostic Imaging Review   Complexity Note: Imaging results reviewed. Results shared with [name], using Layman's terms.                         Meds   Current Outpatient Medications:  .  albuterol (PROVENTIL HFA;VENTOLIN HFA) 108 (90 Base) MCG/ACT inhaler, Inhale 2 puffs into the lungs every 6 (six) hours as needed for wheezing or shortness of breath., Disp: 1 Inhaler, Rfl: 2 .  betamethasone dipropionate (DIPROLENE) 0.05 % cream, Apply topically 2 (two) times daily., Disp: 30 g, Rfl: 3 .  gabapentin (NEURONTIN) 600 MG tablet, 1200 mg  BID, Disp: 120 tablet, Rfl: 1 .  meloxicam (MOBIC) 7.5 MG tablet, Take 1 tablet (7.5 mg total) by mouth daily. (Patient not taking: Reported on 03/29/2017), Disp: 30 tablet, Rfl: 5 .  Oxycodone HCl 10 MG TABS, Take 1 tablet (10 mg total) by mouth every 6 (six) hours as needed., Disp: 120 tablet, Rfl: 0 .  predniSONE (DELTASONE) 10 MG tablet, Take 6 tabs day one, 5 tabs day two, 4 tabs day three, etc (Patient not taking: Reported on 03/29/2017), Disp: 21 tablet, Rfl: 0 .  tiZANidine (ZANAFLEX) 4 MG tablet, Take 1 tablet (4 mg total) by mouth every 8 (eight) hours as needed for muscle spasms., Disp: 90 tablet, Rfl: 3  ROS  Constitutional: Denies any fever or chills Gastrointestinal: No reported hemesis, hematochezia, vomiting, or acute GI distress Musculoskeletal: Denies any acute onset joint swelling, redness, loss of ROM, or weakness Neurological: No reported episodes of acute onset apraxia, aphasia, dysarthria, agnosia, amnesia, paralysis, loss of coordination, or loss of consciousness  Allergies  [name] is  allergic to tramadol.  PFSH  Drug: [name]  reports that he does not use drugs. Alcohol:  reports that he does not drink alcohol. Tobacco:  reports that he has been smoking cigarettes.  He has been smoking about 0.50 packs per day. He has quit using smokeless tobacco. Medical:  has a past medical history of Depression and Panic attack. Surgical: [name]  has a past surgical history that includes Leg amputation; Hand surgery; and Leg Surgery. Family: family history includes Alzheimer's disease in his maternal grandmother; Cancer in his maternal grandfather, mother, and paternal grandmother; Diabetes in his father; Heart disease in his father; Hypertension in his father.  Constitutional Exam  General appearance: Well nourished, well developed, and well hydrated. In no apparent acute distress Vitals:   04/18/17 1220  BP: (!) 147/95  Pulse: (!) 101  Resp: 18  Temp: 97.7 F (36.5 C)  SpO2: 99%  Weight: (!) 323 lb (146.5 kg)  Height: 6' (1.829 m)   BMI Assessment: Estimated body mass index is 43.81 kg/m as calculated from the following:   Height as of this encounter: 6' (1.829 m).   Weight as of this encounter: 323 lb (146.5 kg).  BMI interpretation table: BMI level Category Range association with higher incidence of chronic pain  <18 kg/m2 Underweight   18.5-24.9 kg/m2 Ideal body weight   25-29.9 kg/m2 Overweight Increased incidence by 20%  30-34.9 kg/m2 Obese (Class I) Increased  incidence by 68%  35-39.9 kg/m2 Severe obesity (Class II) Increased incidence by 136%  >40 kg/m2 Extreme obesity (Class III) Increased incidence by 254%   BMI Readings from Last 4 Encounters:  04/18/17 43.81 kg/m  03/29/17 45.43 kg/m  03/01/17 45.84 kg/m  01/01/17 42.86 kg/m   Wt Readings from Last 4 Encounters:  04/18/17 (!) 323 lb (146.5 kg)  03/29/17 (!) 335 lb (152 kg)  03/01/17 (!) 338 lb (153.3 kg)  01/01/17 (!) 316 lb (143.3 kg)  Psych/Mental status: Alert, oriented x 3  (person, place, & time)       Eyes: PERLA Respiratory: No evidence of acute respiratory distress  Cervical Spine Area Exam  Skin & Axial Inspection: No masses, redness, edema, swelling, or associated skin lesions Alignment: Symmetrical Functional ROM: Unrestricted ROM      Stability: No instability detected Muscle Tone/Strength: Functionally intact. No obvious neuro-muscular anomalies detected. Sensory (Neurological): Unimpaired Palpation: No palpable anomalies              Upper Extremity (UE) Exam    Side: Right upper extremity  Side: Left upper extremity  Skin & Extremity Inspection: Skin color, temperature, and hair growth are WNL. No peripheral edema or cyanosis. No masses, redness, swelling, asymmetry, or associated skin lesions. No contractures.  Skin & Extremity Inspection: Skin color, temperature, and hair growth are WNL. No peripheral edema or cyanosis. No masses, redness, swelling, asymmetry, or associated skin lesions. No contractures.  Functional ROM: Unrestricted ROM          Functional ROM: Unrestricted ROM          Muscle Tone/Strength: Functionally intact. No obvious neuro-muscular anomalies detected.  Muscle Tone/Strength: Functionally intact. No obvious neuro-muscular anomalies detected.  Sensory (Neurological): Unimpaired          Sensory (Neurological): Unimpaired          Palpation: No palpable anomalies              Palpation: No palpable anomalies              Specialized Test(s): Deferred         Specialized Test(s): Deferred          Thoracic Spine Area Exam  Skin & Axial Inspection: No masses, redness, or swelling Alignment: Symmetrical Functional ROM: Unrestricted ROM Stability: No instability detected Muscle Tone/Strength: Functionally intact. No obvious neuro-muscular anomalies detected. Sensory (Neurological): Unimpaired Muscle strength & Tone: No palpable anomalies  Lumbar Spine Area Exam  Skin & Axial Inspection: No masses, redness, or  swelling Alignment: Symmetrical Functional ROM: Unrestricted ROM      Stability: No instability detected Muscle Tone/Strength: Functionally intact. No obvious neuro-muscular anomalies detected. Sensory (Neurological): Unimpaired Palpation: No palpable anomalies       Provocative Tests: Lumbar Hyperextension and rotation test: evaluation deferred today       Lumbar Lateral bending test: evaluation deferred today       Patrick's Maneuver: Positive for left-sided S-I arthralgia              Gait & Posture Assessment  Ambulation: Limited Gait: Left leg prosthesis in place. Posture: Difficulty with positional changes    Lower Extremity Exam    Side: Right lower extremity  Side: Left lower extremity  Skin & Extremity Inspection: Skin color, temperature, and hair growth are WNL. No peripheral edema or cyanosis. No masses, redness, swelling, asymmetry, or associated skin lesions. No contractures.  Skin & Extremity Inspection: Below knee amputation (BKA)  Functional ROM: Unrestricted  ROM          Functional ROM: Unrestricted ROM          Muscle Tone/Strength: Functionally intact. No obvious neuro-muscular anomalies detected.  Muscle Tone/Strength: Functionally intact. No obvious neuro-muscular anomalies detected.  Sensory (Neurological): Unimpaired  Sensory (Neurological): Neurogenic pain pattern  Palpation: No palpable anomalies  Palpation: Tender  Allodynia, stump pain, phantom pain at left stump site.  No erythema or discharge noted.   Assessment & Plan  Primary Diagnosis & Pertinent Problem List: The primary encounter diagnosis was S/P BKA (below knee amputation) unilateral, left ([place]). 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  Visit Diagnosis: 1. S/P BKA (below knee amputation) unilateral, left ([place])   2. Left hip pain   3. Chronic pain syndrome    4. 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HCC)   5. 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 Treatment will often involve judicious use of pain medications and interventional procedures to decrease the pain, allowing the patient to participate in the physical activity that will ultimately produce long-lasting pain reductions. The goal of the multidisciplinary approach is to return the patient to a higher level of overall function and to restore their ability to perform activities of daily living.   57 year old male who presents with left stump pain, left phantom pain status post left below the knee amputation performed in 2013.  Patient also has radiating left hip pain as well that starts in his buttock region around his piriformis and radiates anteriorly into his groin.  On exam patient does have positive Patrick's and Faber's test on the left and we discussed performing left sacroiliac joint injection which the patient has had done before in [place] and was helpful..  In regards to his medications, patient was on gabapentin 1200 mg twice daily along with Percocet 10 mg 3 times daily as needed at his previous pain clinic in [place].  He has moved to [place] back in October and is establishing care with new physicians in the area.  Since the patient has already had neuroma injections which have not been helpful, we will not plan on repeating these.  Patient did bring his prior medical records from his [place] pain provider.  Patient was initially prescribed oxycodone 15 mg up to 4 times a day but this dose resulted in sedation and activity and the patient's dose  was reduced to 10 mg 4 times daily as needed which is much better for him in regards to sedation and functionality.  Patient's urine drug screen and psych evaluation were appropriate.  We will have him sign an opiate agreement with our clinic and initiate opioid dosing at his previous of oxycodone 10 mg 4 times daily as needed.  We will also refill his gabapentin 1200 mg twice daily, tizanidine 4 mill grams 3 times daily as needed muscle spasms.    Plan: -Sign opiate agreement -Prescription for oxycodone 10 mg 4 times daily as needed.  Prescription provided for 2 months. -Refill gabapentin 1200 mg twice daily, Zanaflex 4 mill grams 3 times daily as needed -Scheduled for  left SI joint injection under fluoroscopy with sedation for SI joint arthralgia and arthritis. -Consider left trochanteric bursa injection, lumbar facet medial branch nerve blocks for left hip pain and lower axial back pain respectively. -Follow-up in 1-2 weeks for left SI joint injection, 2 months for medication management.  Plan of Care  Pharmacotherapy (Medications Ordered): Meds ordered this encounter  Medications  . tiZANidine (ZANAFLEX) 4 MG tablet    Sig: Take 1 tablet (4 mg total) by mouth every 8 (eight) hours as needed for muscle spasms.    Dispense:  90 tablet    Refill:  3  . gabapentin (NEURONTIN) 600 MG tablet    Sig: 1200 mg  BID    Dispense:  120 tablet    Refill:  1    Do not place this medication, or any other prescription from our practice, on "Automatic Refill". Patient may have prescription filled one day early if pharmacy is closed on scheduled refill date.  Marland Kitchen DISCONTD: Oxycodone HCl 10 MG TABS    Sig: Take 1 tablet (10 mg total) by mouth every 6 (six) hours as needed.    Dispense:  120 tablet    Refill:  0    Do not place this medication, or any other prescription from our practice, on "Automatic Refill". Patient may have prescription filled one day early if pharmacy is closed on scheduled refill  date. For chronic pain, to last for 30 days from fill date  To fill on or after: 04-18-2017, 05-18-2017  . Oxycodone HCl 10 MG TABS    Sig: Take 1 tablet (10 mg total) by mouth every 6 (six) hours as needed.    Dispense:  120 tablet    Refill:  0    Do not place this medication, or any other prescription from our practice, on "Automatic Refill". Patient may have prescription filled one day early if pharmacy is closed on scheduled refill date. For chronic pain, to last for 30 days from fill date  To fill on or after: 04-18-2017, 05-18-2017   Lab-work, procedure(s), and/or referral(s): Orders Placed This Encounter  Procedures  . SACROILIAC JOINT INJECTION   Time Note: Greater than 50% of the 25 minute(s) of face-to-face time spent with [name], was spent in counseling/coordination of care regarding: the treatment plan, treatment alternatives, going over the informed consent, the opioid analgesic risks and possible complications, the appropriate use of his medications, realistic expectations and the goals of pain management (increased in functionality).  Provider-requested follow-up: Return in about 8 weeks (around 06/13/2017) for Medication Management.  Future Appointments  Date Time Provider [place]  05/01/2017  9:30 AM Gillis Santa, MD [facility] None  06/08/2017  1:30 PM Gillis Santa, MD [facility] None  12/05/2017  8:30 AM Volney American, PA-C [facility]    Primary Care Physician: Volney American, PA-C Location: [facility] Note by: Gillis Santa, M.D Date: 04/18/2017; Time: 1:38 PM  Patient Instructions  You have been given 2 scripts for oxycodone today.  I have given you instructions for your procedure without sedation. .  You have prescriptions at the pharmacy to pick up.   -Sign opiate agreement -Scheduled for left SI joint injection (1-2 weeks with sedation) -Prescriptions for medications including oxycodone, tizanidine,  gabapentin. (follow up 2 months for MM)

## 2017-04-18 NOTE — Patient Instructions (Addendum)
You have been given 2 scripts for oxycodone today.  I have given you instructions for your procedure without sedation. .  You have prescriptions at the pharmacy to pick up.   -Sign opiate agreement -Scheduled for left SI joint injection (1-2 weeks with sedation) -Prescriptions for medications including oxycodone, tizanidine, gabapentin. (follow up 2 months for MM)

## 2017-05-01 ENCOUNTER — Ambulatory Visit: Payer: Medicaid Other | Admitting: Student in an Organized Health Care Education/Training Program

## 2017-05-08 ENCOUNTER — Ambulatory Visit: Payer: Medicaid Other | Admitting: Student in an Organized Health Care Education/Training Program

## 2017-05-08 ENCOUNTER — Encounter: Payer: Self-pay | Admitting: Student in an Organized Health Care Education/Training Program

## 2017-05-08 ENCOUNTER — Ambulatory Visit
Admission: RE | Admit: 2017-05-08 | Discharge: 2017-05-08 | Disposition: A | Discharge status: Home / Self Care | Payer: Medicaid Other | Source: Ambulatory Visit | Attending: Student in an Organized Health Care Education/Training Program | Admitting: Student in an Organized Health Care Education/Training Program

## 2017-05-08 VITALS — BP 141/95 | HR 103 | Temp 97.7°F | Resp 21 | Ht 72.0 in | Wt 322.0 lb

## 2017-05-08 DIAGNOSIS — M1990 Unspecified osteoarthritis, unspecified site: Secondary | ICD-10-CM | POA: Insufficient documentation

## 2017-05-08 DIAGNOSIS — M25552 Pain in left hip: Secondary | ICD-10-CM

## 2017-05-08 DIAGNOSIS — M4698 Unspecified inflammatory spondylopathy, sacral and sacrococcygeal region: Secondary | ICD-10-CM

## 2017-05-08 DIAGNOSIS — M47818 Spondylosis without myelopathy or radiculopathy, sacral and sacrococcygeal region: Secondary | ICD-10-CM

## 2017-05-08 DIAGNOSIS — Z89512 Acquired absence of left leg below knee: Secondary | ICD-10-CM | POA: Diagnosis not present

## 2017-05-08 DIAGNOSIS — Z888 Allergy status to other drugs, medicaments and biological substances status: Secondary | ICD-10-CM | POA: Diagnosis not present

## 2017-05-08 DIAGNOSIS — M549 Dorsalgia, unspecified: Secondary | ICD-10-CM | POA: Diagnosis not present

## 2017-05-08 DIAGNOSIS — G894 Chronic pain syndrome: Secondary | ICD-10-CM | POA: Insufficient documentation

## 2017-05-08 DIAGNOSIS — Z79899 Other long term (current) drug therapy: Secondary | ICD-10-CM | POA: Insufficient documentation

## 2017-05-08 MED ORDER — DEXAMETHASONE SODIUM PHOSPHATE 10 MG/ML IJ SOLN
INTRAMUSCULAR | Status: AC
  Filled 2017-05-08: qty 1

## 2017-05-08 MED ORDER — IOPAMIDOL (ISOVUE-M 200) INJECTION 41%
INTRAMUSCULAR | Status: AC
  Filled 2017-05-08: qty 10

## 2017-05-08 MED ORDER — DEXAMETHASONE SODIUM PHOSPHATE 10 MG/ML IJ SOLN
10.0000 mg | Freq: Once | INTRAMUSCULAR | Status: AC
  Administered 2017-05-08: 10 mg

## 2017-05-08 MED ORDER — LIDOCAINE HCL (PF) 1 % IJ SOLN
INTRAMUSCULAR | Status: AC
  Filled 2017-05-08: qty 5

## 2017-05-08 MED ORDER — SODIUM CHLORIDE 0.9 % IJ SOLN
INTRAMUSCULAR | Status: AC
  Filled 2017-05-08: qty 10

## 2017-05-08 MED ORDER — LIDOCAINE HCL (PF) 1 % IJ SOLN
2.0000 mL | Freq: Once | INTRAMUSCULAR | Status: AC
  Administered 2017-05-08: 5 mL

## 2017-05-08 MED ORDER — ROPIVACAINE HCL 2 MG/ML IJ SOLN
4.0000 mL | Freq: Once | INTRAMUSCULAR | Status: AC
  Administered 2017-05-08: 10 mL via INTRA_ARTICULAR

## 2017-05-08 MED ORDER — ROPIVACAINE HCL 2 MG/ML IJ SOLN
INTRAMUSCULAR | Status: AC
  Filled 2017-05-08: qty 10

## 2017-05-08 NOTE — Patient Instructions (Signed)

## 2017-05-08 NOTE — Progress Notes (Signed)
Patient's Name: [name] Day.  MRN: [id number]  Referring Provider: Volney American,*  DOB: [birth year]  PCP: Volney American, PA-C  DOS: 05/08/2017  Note by: Gillis Santa, MD  Service setting: Ambulatory outpatient  Specialty: Interventional Pain Management  Patient type: Established  Location: [facility] (AMB) Pain Management Facility  Visit type: Interventional Procedure   Primary Reason for Visit: Interventional Pain Management Treatment. CC: Back Pain (left ) and Hip Pain (left)  Procedure:  Anesthesia, Analgesia, Anxiolysis:  Type: Diagnostic Sacroiliac Joint Steroid Injection          Region: Superior Lumbosacral Region Level: PSIS (Posterior Superior Iliac Spine) Laterality: Left-Sided  Type: Local Anesthesia Local Anesthetic: Lidocaine 1% Route: Infiltration ([place]/IM) IV Access: Declined Sedation: Meaningful verbal contact was maintained at all times during the procedure  Indication(s): Analgesia and Anxiety   Indications: 1. SI joint arthritis ([place])   2. S/P BKA (below knee amputation) unilateral, left ([place])   3. Left hip pain   4. Chronic pain syndrome    Pain Score: Pre-procedure: 6 /10 Post-procedure: 6 /10  Pre-op Assessment:  [name] is a 57 y.o. (year old), male patient, seen today for interventional treatment. He  has a past surgical history that includes Leg amputation; Hand surgery; and Leg Surgery. Mr. Hopkin has a current medication list which includes the following prescription(s): albuterol, gabapentin, oxycodone hcl, tizanidine, betamethasone dipropionate, meloxicam, and prednisone. His primarily concern today is the Back Pain (left ) and Hip Pain (left)  Initial Vital Signs:  Pulse Rate: (!) 103 Temp: 97.7 F (36.5 C) ECG Heart Rate: 98 Resp: 16 BP: 133/80 SpO2: 97 % ETCO2:    BMI: Estimated body mass index is 43.67 kg/m as calculated from the following:   Height as of this encounter: 6' (1.829 m).   Weight as of this  encounter: 322 lb (146.1 kg).  Risk Assessment: Allergies: Reviewed. He is allergic to tramadol.  Allergy Precautions: None required Coagulopathies: Reviewed. None identified.  Blood-thinner therapy: None at this time Active Infection(s): Reviewed. None identified. [name] is afebrile  Site Confirmation: [name] was asked to confirm the procedure and laterality before marking the site Procedure checklist: Completed Consent: Before the procedure and under the influence of no sedative(s), amnesic(s), or anxiolytics, the patient was informed of the treatment options, risks and possible complications. To fulfill our ethical and legal obligations, as recommended by the American Medical Association's Code of Ethics, I have informed the patient of my clinical impression; the nature and purpose of the treatment or procedure; the risks, benefits, and possible complications of the intervention; the alternatives, including doing nothing; the risk(s) and benefit(s) of the alternative treatment(s) or procedure(s); and the risk(s) and benefit(s) of doing nothing. The patient was provided information about the general risks and possible complications associated with the procedure. These may include, but are not limited to: failure to achieve desired goals, infection, bleeding, organ or nerve damage, allergic reactions, paralysis, and death. In addition, the patient was informed of those risks and complications associated to the procedure, such as failure to decrease pain; infection; bleeding; organ or nerve damage with subsequent damage to sensory, motor, and/or autonomic systems, resulting in permanent pain, numbness, and/or weakness of one or several areas of the body; allergic reactions; (i.e.: anaphylactic reaction); and/or death. Furthermore, the patient was informed of those risks and complications associated with the medications. These include, but are not limited to: allergic reactions (i.e.:  anaphylactic or anaphylactoid reaction(s)); adrenal axis suppression; blood sugar  elevation that in diabetics may result in ketoacidosis or comma; water retention that in patients with history of congestive heart failure may result in shortness of breath, pulmonary edema, and decompensation with resultant heart failure; weight gain; swelling or edema; medication-induced neural toxicity; particulate matter embolism and blood vessel occlusion with resultant organ, and/or nervous system infarction; and/or aseptic necrosis of one or more joints. Finally, the patient was informed that Medicine is not an exact science; therefore, there is also the possibility of unforeseen or unpredictable risks and/or possible complications that may result in a catastrophic outcome. The patient indicated having understood very clearly. We have given the patient no guarantees and we have made no promises. Enough time was given to the patient to ask questions, all of which were answered to the patient's satisfaction. [name] has indicated that he wanted to continue with the procedure. Attestation: I, the ordering provider, attest that I have discussed with the patient the benefits, risks, side-effects, alternatives, likelihood of achieving goals, and potential problems during recovery for the procedure that I have provided informed consent. Date: 05/08/2017; Time: 10:39 AM  Pre-Procedure Preparation:  Monitoring: As per clinic protocol. Respiration, ETCO2, SpO2, BP, heart rate and rhythm monitor placed and checked for adequate function Safety Precautions: Patient was assessed for positional comfort and pressure points before starting the procedure. Time-out: I initiated and conducted the "Time-out" before starting the procedure, as per protocol. The patient was asked to participate by confirming the accuracy of the "Time Out" information. Verification of the correct person, site, and procedure were performed and confirmed by  me, the nursing staff, and the patient. "Time-out" conducted as per Joint Commission's Universal Protocol (UP.01.01.01). "Time-out" Date & Time: 05/08/2017; 1101 hrs.  Description of Procedure Process:  Position: Prone Target Area: Superior, posterior, aspect of the sacroiliac fissure Approach: Posterior, paraspinal, ipsilateral approach. Area Prepped: Entire Lower Lumbosacral Region Prepping solution: ChloraPrep (2% chlorhexidine gluconate and 70% isopropyl alcohol) Safety Precautions: Aspiration looking for blood return was conducted prior to all injections. At no point did we inject any substances, as a needle was being advanced. No attempts were made at seeking any paresthesias. Safe injection practices and needle disposal techniques used. Medications properly checked for expiration dates. SDV (single dose vial) medications used. Description of the Procedure: Protocol guidelines were followed. The patient was placed in position over the procedure table. The target area was identified and the area prepped in the usual manner. Skin & deeper tissues infiltrated with local anesthetic. Appropriate amount of time allowed to pass for local anesthetics to take effect. The procedure needle was advanced under fluoroscopic guidance into the sacroiliac joint until a firm endpoint was obtained. Proper needle placement secured. Negative aspiration confirmed. Solution injected in intermittent fashion, asking for systemic symptoms every 0.5cc of injectate. The needles were then removed and the area cleansed, making sure to leave some of the prepping solution back to take advantage of its long term bactericidal properties. Vitals:   05/08/17 1032 05/08/17 1100 05/08/17 1105  BP: 133/80 (!) 141/95 (!) 141/95  Pulse: (!) 103    Resp: 16 11 (!) 21  Temp: 97.7 F (36.5 C)    TempSrc: Oral    SpO2: 97% 95% 96%  Weight: (!) 322 lb (146.1 kg)    Height: 6' (1.829 m)      Start Time: 1101 hrs. End Time: 1105  hrs. Materials:  Needle(s) Type: Regular needle Gauge: 22G Length: 3.5-in Medication(s): We administered ropivacaine (PF) 2 mg/mL (0.2%), lidocaine (PF),  and dexamethasone. Please see chart orders for dosing details. 5 cc solution made of 4 cc of 0.2% ropivacaine, 1 cc of Decadron 10 mg/cc.  2.5 cc injected intra-articular, 2.5 cc injected periarticular. Imaging Guidance (Non-Spinal):  Type of Imaging Technique: Fluoroscopy Guidance (Non-Spinal) Indication(s): Assistance in needle guidance and placement for procedures requiring needle placement in or near specific anatomical locations not easily accessible without such assistance. Exposure Time: Please see nurses notes. Contrast: Before injecting any contrast, we confirmed that the patient did not have an allergy to iodine, shellfish, or radiological contrast. Once satisfactory needle placement was completed at the desired level, radiological contrast was injected. Contrast injected under live fluoroscopy. No contrast complications. See chart for type and volume of contrast used. Fluoroscopic Guidance: I was personally present during the use of fluoroscopy. "Tunnel Vision Technique" used to obtain the best possible view of the target area. Parallax error corrected before commencing the procedure. "Direction-depth-direction" technique used to introduce the needle under continuous pulsed fluoroscopy. Once target was reached, antero-posterior, oblique, and lateral fluoroscopic projection used confirm needle placement in all planes. Images permanently stored in EMR. Interpretation: I personally interpreted the imaging intraoperatively. Adequate needle placement confirmed in multiple planes. Appropriate spread of contrast into desired area was observed. No evidence of afferent or efferent intravascular uptake. Permanent images saved into the patient's record.  Antibiotic Prophylaxis:  Indication(s): None identified Antibiotic given:  None  Post-operative Assessment:  Post-procedure Vital Signs:  Pulse Rate: (!) 103 Temp: 97.7 F (36.5 C) ECG Heart Rate: (!) 105 Resp: (!) 21 BP: (!) 141/95 SpO2: 96 % ETCO2:   EBL: None Complications: No immediate post-treatment complications observed by team, or reported by patient. Note: The patient tolerated the entire procedure well. A repeat set of vitals were taken after the procedure and the patient was kept under observation following institutional policy, for this type of procedure. Post-procedural neurological assessment was performed, showing return to baseline, prior to discharge. The patient was provided with post-procedure discharge instructions, including a section on how to identify potential problems. Should any problems arise concerning this procedure, the patient was given instructions to immediately contact us, at any time, without hesitation. In any case, we plan to contact the patient by telephone for a follow-up status report regarding this interventional procedure. Comments:  No additional relevant information.  Plan of Care    Imaging Orders     DG C-Arm 1-60 Min-No Report Procedure Orders    No procedure(s) ordered today    Medications ordered for procedure: Meds ordered this encounter  Medications  . ropivacaine (PF) 2 mg/mL (0.2%) (NAROPIN) injection 4 mL  . lidocaine (PF) (XYLOCAINE) 1 % injection 2 mL  . dexamethasone (DECADRON) injection 10 mg   Medications administered: We administered ropivacaine (PF) 2 mg/mL (0.2%), lidocaine (PF), and dexamethasone.  See the medical record for exact dosing, route, and time of administration.  New Prescriptions   No medications on file   Disposition: Discharge home  Discharge Date & Time: 05/08/2017; 1107 hrs.   Physician-requested Follow-up: Return in about 3 weeks (around 05/29/2017) for Post Procedure Evaluation.  Future Appointments  Date Time Provider [place]  05/30/2017 11:30 AM Gillis Santa, MD [facility] None  06/08/2017  1:30 PM Gillis Santa, MD [facility] None  12/05/2017  8:30 AM Volney American, PA-C [facility]   Primary Care Physician: Volney American, PA-C Location: [facility] Outpatient [facility] Management Facility Note by: Gillis Santa, MD Date: 05/08/2017; Time: 12:15 PM  Disclaimer:  Medicine  is not an Chief Strategy Officer. The only guarantee in medicine is that nothing is guaranteed. It is important to note that the decision to proceed with this intervention was based on the information collected from the patient. The Data and conclusions were drawn from the patient's questionnaire, the interview, and the physical examination. Because the information was provided in large part by the patient, it cannot be guaranteed that it has not been purposely or unconsciously manipulated. Every effort has been made to obtain as much relevant data as possible for this evaluation. It is important to note that the conclusions that lead to this procedure are derived in large part from the available data. Always take into account that the treatment will also be dependent on availability of resources and existing treatment guidelines, considered by other Pain Management Practitioners as being common knowledge and practice, at the time of the intervention. For Medico-Legal purposes, it is also important to point out that variation in procedural techniques and pharmacological choices are the acceptable norm. The indications, contraindications, technique, and results of the above procedure should only be interpreted and judged by a Board-Certified Interventional Pain Specialist with extensive familiarity and expertise in the same exact procedure and technique.

## 2017-05-08 NOTE — Progress Notes (Signed)
Safety precautions to be maintained throughout the outpatient stay will include: orient to surroundings, keep bed in low position, maintain call bell within reach at all times, provide assistance with transfer out of bed and ambulation.  

## 2017-05-09 ENCOUNTER — Ambulatory Visit: Payer: Medicaid Other | Admitting: Student in an Organized Health Care Education/Training Program

## 2017-05-30 ENCOUNTER — Ambulatory Visit: Payer: Medicaid Other | Admitting: Student in an Organized Health Care Education/Training Program

## 2017-06-08 ENCOUNTER — Encounter: Payer: Self-pay | Admitting: Student in an Organized Health Care Education/Training Program

## 2017-06-08 ENCOUNTER — Other Ambulatory Visit: Payer: Self-pay

## 2017-06-08 ENCOUNTER — Ambulatory Visit
Payer: Medicaid Other | Attending: Student in an Organized Health Care Education/Training Program | Admitting: Student in an Organized Health Care Education/Training Program

## 2017-06-08 VITALS — BP 164/84 | HR 106 | Temp 98.2°F | Resp 18 | Ht 72.0 in | Wt 316.0 lb

## 2017-06-08 DIAGNOSIS — M47816 Spondylosis without myelopathy or radiculopathy, lumbar region: Secondary | ICD-10-CM | POA: Diagnosis not present

## 2017-06-08 DIAGNOSIS — F17210 Nicotine dependence, cigarettes, uncomplicated: Secondary | ICD-10-CM | POA: Insufficient documentation

## 2017-06-08 DIAGNOSIS — M4698 Unspecified inflammatory spondylopathy, sacral and sacrococcygeal region: Secondary | ICD-10-CM | POA: Diagnosis not present

## 2017-06-08 DIAGNOSIS — M545 Low back pain: Secondary | ICD-10-CM | POA: Diagnosis not present

## 2017-06-08 DIAGNOSIS — M1990 Unspecified osteoarthritis, unspecified site: Secondary | ICD-10-CM | POA: Diagnosis not present

## 2017-06-08 DIAGNOSIS — Z79899 Other long term (current) drug therapy: Secondary | ICD-10-CM | POA: Insufficient documentation

## 2017-06-08 DIAGNOSIS — M47818 Spondylosis without myelopathy or radiculopathy, sacral and sacrococcygeal region: Secondary | ICD-10-CM

## 2017-06-08 DIAGNOSIS — G894 Chronic pain syndrome: Secondary | ICD-10-CM | POA: Diagnosis not present

## 2017-06-08 DIAGNOSIS — Z89512 Acquired absence of left leg below knee: Secondary | ICD-10-CM | POA: Diagnosis not present

## 2017-06-08 DIAGNOSIS — Z888 Allergy status to other drugs, medicaments and biological substances status: Secondary | ICD-10-CM | POA: Diagnosis not present

## 2017-06-08 DIAGNOSIS — M25552 Pain in left hip: Secondary | ICD-10-CM | POA: Insufficient documentation

## 2017-06-08 DIAGNOSIS — M549 Dorsalgia, unspecified: Secondary | ICD-10-CM | POA: Diagnosis present

## 2017-06-08 DIAGNOSIS — F329 Major depressive disorder, single episode, unspecified: Secondary | ICD-10-CM | POA: Insufficient documentation

## 2017-06-08 MED ORDER — OXYCODONE HCL 10 MG PO TABS: 10.0000 mg | ORAL_TABLET | Freq: Four times a day (QID) | ORAL | 0 refills | Status: DC | PRN

## 2017-06-08 NOTE — Progress Notes (Signed)
Nursing Pain Medication Assessment:  Safety precautions to be maintained throughout the outpatient stay will include: orient to surroundings, keep bed in low position, maintain call bell within reach at all times, provide assistance with transfer out of bed and ambulation.  Medication Inspection Compliance: [name] Day did not comply with our request to bring his pills to be counted. He was reminded that bringing the medication bottles, even when empty, is a requirement.  Medication: None brought in. Pill/Patch Count: None available to be counted. Bottle Appearance: No container available. Did not bring bottle(s) to appointment. Filled Date: N/A Last Medication intake:  Today

## 2017-06-08 NOTE — Patient Instructions (Signed)
Sacroiliac (SI) Joint Injection Patient Information  Description: The sacroiliac joint connects the scrum (very low back and tailbone) to the ilium (a pelvic bone which also forms half of the hip joint).  Normally this joint experiences very little motion.  When this joint becomes inflamed or unstable low back and or hip and pelvis pain may result.  Injection of this joint with local anesthetics (numbing medicines) and steroids can provide diagnostic information and reduce pain.  This injection is performed with the aid of x-ray guidance into the tailbone area while you are lying on your stomach.   You may experience an electrical sensation down the leg while this is being done.  You may also experience numbness.  We also may ask if we are reproducing your normal pain during the injection.  Conditions which may be treated SI injection:   Low back, buttock, hip or leg pain  Preparation for the Injection:  1. Do not eat any solid food or dairy products within 8 hours of your appointment.  2. You may drink clear liquids up to 3 hours before appointment.  Clear liquids include water, black coffee, juice or soda.  No milk or cream please. 3. You may take your regular medications, including pain medications with a sip of water before your appointment.  Diabetics should hold regular insulin (if take separately) and take 1/2 normal NPH dose the morning of the procedure.  Carry some sugar containing items with you to your appointment. 4. A driver must accompany you and be prepared to drive you home after your procedure. 5. Bring all of your current medications with you. 6. An IV may be inserted and sedation may be given at the discretion of the physician. 7. A blood pressure cuff, EKG and other monitors will often be applied during the procedure.  Some patients may need to have extra oxygen administered for a short period.  8. You will be asked to provide medical information, including your allergies,  prior to the procedure.  We must know immediately if you are taking blood thinners (like Coumadin/Warfarin) or if you are allergic to IV iodine contrast (dye).  We must know if you could possible be pregnant.  Possible side effects:   Bleeding from needle site  Infection (rare, may require surgery)  Nerve injury (rare)  Numbness & tingling (temporary)  A brief convulsion or seizure  Light-headedness (temporary)  Pain at injection site (several days)  Decreased blood pressure (temporary)  Weakness in the leg (temporary)   Call if you experience:   New onset weakness or numbness of an extremity below the injection site that last more than 8 hours.  Hives or difficulty breathing ( go to the emergency room)  Inflammation or drainage at the injection site  Any new symptoms which are concerning to you  Please note:  Although the local anesthetic injected can often make your back/ hip/ buttock/ leg feel good for several hours after the injections, the pain will likely return.  It takes 3-7 days for steroids to work in the sacroiliac area.  You may not notice any pain relief for at least that one week.  If effective, we will often do a series of three injections spaced 3-6 weeks apart to maximally decrease your pain.  After the initial series, we generally will wait some months before a repeat injection of the same type.  If you have any questions, please call [phone] [place]  COMPLICATIONS  What are the risk, side effects and possible complications? Generally speaking, most procedures are safe.  However, with any procedure there are risks, side effects, and the possibility of complications.  The risks and complications are dependent upon the sites that are lesioned, or the type of nerve block to be performed.  The closer the procedure is to the spine, the more serious the risks are.  Great care is taken when placing  the radio frequency needles, block needles or lesioning probes, but sometimes complications can occur. 1. Infection: Any time there is an injection through the skin, there is a risk of infection.  This is why sterile conditions are used for these blocks.  There are four possible types of infection. 1. Localized skin infection. 2. Central Nervous System Infection-This can be in the form of Meningitis, which can be deadly. 3. Epidural Infections-This can be in the form of an epidural abscess, which can cause pressure inside of the spine, causing compression of the spinal cord with subsequent paralysis. This would require an emergency surgery to decompress, and there are no guarantees that the patient would recover from the paralysis. 4. Discitis-This is an infection of the intervertebral discs.  It occurs in about 1% of discography procedures.  It is difficult to treat and it may lead to surgery.        2. Pain: the needles have to go through skin and soft tissues, will cause soreness.       3. Damage to internal structures:  The nerves to be lesioned may be near blood vessels or    other nerves which can be potentially damaged.       4. Bleeding: Bleeding is more common if the patient is taking blood thinners such as  aspirin, Coumadin, Ticiid, Plavix, etc., or if he/she have some genetic predisposition  such as hemophilia. Bleeding into the spinal canal can cause compression of the spinal  cord with subsequent paralysis.  This would require an emergency surgery to  decompress and there are no guarantees that the patient would recover from the  paralysis.       5. Pneumothorax:  Puncturing of a lung is a possibility, every time a needle is introduced in  the area of the chest or upper back.  Pneumothorax refers to free air around the  collapsed lung(s), inside of the thoracic cavity (chest cavity).  Another two possible  complications related to a similar event would include: Hemothorax and Chylothorax.     These are variations of the Pneumothorax, where instead of air around the collapsed  lung(s), you may have blood or chyle, respectively.       6. Spinal headaches: They may occur with any procedures in the area of the spine.       7. Persistent CSF (Cerebro-Spinal Fluid) leakage: This is a rare problem, but may occur  with prolonged intrathecal or epidural catheters either due to the formation of a fistulous  track or a dural tear.       8. Nerve damage: By working so close to the spinal cord, there is always a possibility of  nerve damage, which could be as serious as a permanent spinal cord injury with  paralysis.       9. Death:  Although rare, severe deadly allergic reactions known as "Anaphylactic  reaction" can occur to any of the medications used.      10. Worsening of the symptoms:  We can always make thing worse.  What are the chances of something like this happening? Chances of any of this occuring are extremely low.  By statistics, you have more of a chance of getting killed in a motor vehicle accident: while driving to the hospital than any of the above occurring .  Nevertheless, you should be aware that they are possibilities.  In general, it is similar to taking a shower.  Everybody knows that you can slip, hit your head and get killed.  Does that mean that you should not shower again?  Nevertheless always keep in mind that statistics do not mean anything if you happen to be on the wrong side of them.  Even if a procedure has a 1 (one) in a 1,000,000 (million) chance of going wrong, it you happen to be that one..Also, keep in mind that by statistics, you have more of a chance of having something go wrong when taking medications.  Who should not have this procedure? If you are on a blood thinning medication (e.g. Coumadin, Plavix, see list of "Blood Thinners"), or if you have an active infection going on, you should not have the procedure.  If you are taking any blood thinners, please inform  your physician.  How should I prepare for this procedure?  Do not eat or drink anything at least six hours prior to the procedure.  Bring a driver with you .  It cannot be a taxi.  Come accompanied by an adult that can drive you back, and that is strong enough to help you if your legs get weak or numb from the local anesthetic.  Take all of your medicines the morning of the procedure with just enough water to swallow them.  If you have diabetes, make sure that you are scheduled to have your procedure done first thing in the morning, whenever possible.  If you have diabetes, take only half of your insulin dose and notify our nurse that you have done so as soon as you arrive at the clinic.  If you are diabetic, but only take blood sugar pills (oral hypoglycemic), then do not take them on the morning of your procedure.  You may take them after you have had the procedure.  Do not take aspirin or any aspirin-containing medications, at least eleven (11) days prior to the procedure.  They may prolong bleeding.  Wear loose fitting clothing that may be easy to take off and that you would not mind if it got stained with Betadine or blood.  Do not wear any jewelry or perfume  Remove any nail coloring.  It will interfere with some of our monitoring equipment.  NOTE: Remember that this is not meant to be interpreted as a complete list of all possible complications.  Unforeseen problems may occur.  BLOOD THINNERS The following drugs contain aspirin or other products, which can cause increased bleeding during surgery and should not be taken for 2 weeks prior to and 1 week after surgery.  If you should need take something for relief of minor pain, you may take acetaminophen which is found in Tylenol,m Datril, Anacin-3 and Panadol. It is not blood thinner. The products listed below are.  Do not take any of the products listed below in addition to any listed on your instruction sheet.  A.P.C or A.P.C  with Codeine Codeine Phosphate Capsules #3 Ibuprofen Ridaura  ABC compound Congesprin Imuran rimadil  Advil Cope Indocin Robaxisal  Alka-Seltzer Effervescent Pain Reliever and Antacid Coricidin or Coricidin-D  Indomethacin Rufen  Alka-Seltzer plus Cold Medicine Cosprin Ketoprofen S-A-C Tablets  Anacin Analgesic Tablets or Capsules Coumadin Korlgesic Salflex  Anacin Extra Strength Analgesic tablets or capsules CP-2 Tablets Lanoril Salicylate  Anaprox Cuprimine Capsules Levenox Salocol  Anexsia-D Dalteparin Magan Salsalate  Anodynos Darvon compound Magnesium Salicylate Sine-off  Ansaid Dasin Capsules Magsal Sodium Salicylate  Anturane Depen Capsules Marnal Soma  APF Arthritis pain formula Dewitt's Pills Measurin Stanback  Argesic Dia-Gesic Meclofenamic Sulfinpyrazone  Arthritis Bayer Timed Release Aspirin Diclofenac Meclomen Sulindac  Arthritis pain formula Anacin Dicumarol Medipren Supac  Analgesic (Safety coated) Arthralgen Diffunasal Mefanamic Suprofen  Arthritis Strength Bufferin Dihydrocodeine Mepro Compound Suprol  Arthropan liquid Dopirydamole Methcarbomol with Aspirin Synalgos  ASA tablets/Enseals Disalcid Micrainin Tagament  Ascriptin Doan's Midol Talwin  Ascriptin A/D Dolene Mobidin Tanderil  Ascriptin Extra Strength Dolobid Moblgesic Ticlid  Ascriptin with Codeine Doloprin or Doloprin with Codeine Momentum Tolectin  Asperbuf Duoprin Mono-gesic Trendar  Aspergum Duradyne Motrin or Motrin IB Triminicin  Aspirin plain, buffered or enteric coated Durasal Myochrisine Trigesic  Aspirin Suppositories Easprin Nalfon Trillsate  Aspirin with Codeine Ecotrin Regular or Extra Strength Naprosyn Uracel  Atromid-S Efficin Naproxen Ursinus  Auranofin Capsules Elmiron Neocylate Vanquish  Axotal Emagrin Norgesic Verin  Azathioprine Empirin or Empirin with Codeine Normiflo Vitamin E  Azolid Emprazil Nuprin Voltaren  Bayer Aspirin plain, buffered or children's or timed BC Tablets or powders  Encaprin Orgaran Warfarin Sodium  Buff-a-Comp Enoxaparin Orudis Zorpin  Buff-a-Comp with Codeine Equegesic Os-Cal-Gesic   Buffaprin Excedrin plain, buffered or Extra Strength Oxalid   Bufferin Arthritis Strength Feldene Oxphenbutazone   Bufferin plain or Extra Strength Feldene Capsules Oxycodone with Aspirin   Bufferin with Codeine Fenoprofen Fenoprofen Pabalate or Pabalate-SF   Buffets II Flogesic Panagesic   Buffinol plain or Extra Strength Florinal or Florinal with Codeine Panwarfarin   Buf-Tabs Flurbiprofen Penicillamine   Butalbital Compound Four-way cold tablets Penicillin   Butazolidin Fragmin Pepto-Bismol   Carbenicillin Geminisyn Percodan   Carna Arthritis Reliever Geopen Persantine   Carprofen Gold's salt Persistin   Chloramphenicol Goody's Phenylbutazone   Chloromycetin Haltrain Piroxlcam   Clmetidine heparin Plaquenil   Cllnoril Hyco-pap Ponstel   Clofibrate Hydroxy chloroquine Propoxyphen         Before stopping any of these medications, be sure to consult the physician who ordered them.  Some, such as Coumadin (Warfarin) are ordered to prevent or treat serious conditions such as "deep thrombosis", "pumonary embolisms", and other heart problems.  The amount of time that you may need off of the medication may also vary with the medication and the reason for which you were taking it.  If you are taking any of these medications, please make sure you notify your pain physician before you undergo any procedures.         Facet Joint Block The facet joints connect the bones of the spine (vertebrae). They make it possible for you to bend, twist, and make other movements with your spine. They also keep you from bending too far, twisting too far, and making other excessive movements. A facet joint block is a procedure where a numbing medicine (anesthetic) is injected into a facet joint. Often, a type of anti-inflammatory medicine called a steroid is also injected. A facet joint  block may be done to diagnose neck or back pain. If the pain gets better after a facet joint block, it means the pain is probably coming from the facet joint. If the pain does not get better, it means the pain is probably not coming  from the facet joint. A facet joint block may also be done to relieve neck or back pain caused by an inflamed facet joint. A facet joint block is only done to relieve pain if the pain does not improve with other methods, such as medicine, exercise programs, and physical therapy. Tell a health care provider about:  Any allergies you have.  All medicines you are taking, including vitamins, herbs, eye drops, creams, and over-the-counter medicines.  Any problems you or family members have had with anesthetic medicines.  Any blood disorders you have.  Any surgeries you have had.  Any medical conditions you have.  Whether you are pregnant or may be pregnant. What are the risks? Generally, this is a safe procedure. However, problems may occur, including:  Bleeding.  Injury to a nerve near the injection site.  Pain at the injection site.  Weakness or numbness in areas controlled by nerves near the injection site.  Infection.  Temporary fluid retention.  Allergic reactions to medicines or dyes.  Injury to other structures or organs near the injection site.  What happens before the procedure?  Follow instructions from your health care provider about eating or drinking restrictions.  Ask your health care provider about: ? Changing or stopping your regular medicines. This is especially important if you are taking diabetes medicines or blood thinners. ? Taking medicines such as aspirin and ibuprofen. These medicines can thin your blood. Do not take these medicines before your procedure if your health care provider instructs you not to.  Do not take any new dietary supplements or medicines without asking your health care provider first.  Plan to have  someone take you home after the procedure. What happens during the procedure?  You may need to remove your clothing and dress in an open-back gown.  The procedure will be done while you are lying on an X-ray table. You will most likely be asked to lie on your stomach, but you may be asked to lie in a different position if an injection will be made in your neck.  Machines will be used to monitor your oxygen levels, heart rate, and blood pressure.  If an injection will be made in your neck, an IV tube will be inserted into one of your veins. Fluids and medicine will flow directly into your body through the IV tube.  The area over the facet joint where the injection will be made will be cleaned with soap. The surrounding skin will be covered with clean drapes.  A numbing medicine (local anesthetic) will be applied to your skin. Your skin may sting or burn for a moment.  A video X-ray machine (fluoroscopy) will be used to locate the joint. In some cases, a CT scan may be used.  A contrast dye may be injected into the facet joint area to help locate the joint.  When the joint is located, an anesthetic will be injected into the joint through the needle.  Your health care provider will ask you whether you feel pain relief. If you do feel relief, a steroid may be injected to provide pain relief for a longer period of time. If you do not feel relief or feel only partial relief, additional injections of an anesthetic may be made in other facet joints.  The needle will be removed.  Your skin will be cleaned.  A bandage (dressing) will be applied over each injection site. The procedure may vary among health care providers and hospitals. What happens after  the procedure?  You will be observed for 15-30 minutes before being allowed to go home. This information is not intended to replace advice given to you by your health care provider. Make sure you discuss any questions you have with your health  care provider. Document Released: 08/17/2006 Document Revised: 04/29/2015 Document Reviewed: 12/22/2014 [employer] Interactive Patient Education  [employer].

## 2017-06-08 NOTE — Progress Notes (Signed)
Patient's Name: [name] Day.  MRN: [id number]  Referring Provider: Volney American,*  DOB: 1960-10-01  PCP: Volney American, PA-C  DOS: 06/08/2017  Note by: Gillis Santa, MD  Service setting: Ambulatory outpatient  Specialty: Interventional Pain Management  Location: [facility] (AMB) Pain Management Facility    Patient type: Established   Primary Reason(s) for Visit: Encounter for prescription drug management & post-procedure evaluation of chronic illness with mild to moderate exacerbation(Level of risk: moderate) CC: Hip Pain (left) and Back Pain (left mostly)  HPI  [name] is a 57 y.o. year old, male patient, who comes today for a post-procedure evaluation and medication management. He has Depression; Chronic left hip pain; S/P BKA (below knee amputation) unilateral, left ([place]); Chronic pain syndrome; and 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HCC) on their problem list. His primarily concern today is the Hip Pain (left) and Back Pain (left mostly)  Pain Assessment: Location: Left Hip Radiating: left low back Onset: More than a month ago Duration: Chronic pain Quality: Aching, Constant, Throbbing Severity: 6 /10 (self-reported pain score)  Note: Reported level is inconsistent with clinical observations. Clinically the patient looks like a 3/10 A 3/10 is viewed as "Moderate" and described as significantly interfering with activities of daily living (ADL). It becomes difficult to feed, bathe, get dressed, get on and off the toilet or to perform personal hygiene functions. Difficult to get in and out of bed or a chair without assistance. Very distracting. With effort, it can be ignored when deeply involved in activities.       When using our objective Pain Scale, levels between 6 and 10/10 are said to belong in an emergency room, as it progressively worsens from a 6/10, described as severely limiting, requiring emergency care not usually available at an outpatient  pain management facility. At a 6/10 level, communication becomes difficult and requires great effort. Assistance to reach the emergency department may be required. Facial flushing and profuse sweating along with potentially dangerous increases in heart rate and blood pressure will be evident. Effect on ADL:   Timing: Constant Modifying factors: medications, procedure  [name] was last seen on 05/30/2017 for a procedure. During today's appointment we reviewed Mr. Sans's post-procedure results, as well as his outpatient medication regimen.  Further details on both, my assessment(s), as well as the proposed treatment plan, please see below.  Controlled Substance Pharmacotherapy Assessment REMS (Risk Evaluation and Mitigation Strategy)  Analgesic: Oxy 48m 4 times daily prn, quantity 120,  MME/day: 60 MME  SHart Rochester RN  06/08/2017  1:34 PM  Sign at close encounter Nursing Pain Medication Assessment:  Safety precautions to be maintained throughout the outpatient stay will include: orient to surroundings, keep bed in low position, maintain call bell within reach at all times, provide assistance with transfer out of bed and ambulation.  Medication Inspection Compliance: Mr. SSisneydid not comply with our request to bring his pills to be counted. He was reminded that bringing the medication bottles, even when empty, is a requirement.  Medication: None brought in. Pill/Patch Count: None available to be counted. Bottle Appearance: No container available. Did not bring bottle(s) to appointment. Filled Date: N/A Last Medication intake:  Today   Pharmacokinetics: Liberation and absorption (onset of action): WNL Distribution (time to peak effect): WNL Metabolism and excretion (duration of action): WNL         Pharmacodynamics: Desired effects: Analgesia: Mr. SBakkenreports >50% benefit. Functional ability: Patient reports that medication  allows him to accomplish basic  ADLs Clinically meaningful improvement in function (CMIF): Sustained CMIF goals met Perceived effectiveness: Described as relatively effective, allowing for increase in activities of daily living (ADL) Undesirable effects: Side-effects or Adverse reactions: None reported Monitoring: [place] PMP: Online review of the past 60-monthperiod conducted. Compliant with practice rules and regulations Last UDS on record: Summary  Date Value Ref Range Status  03/29/2017 FINAL  Final    Comment:    ==================================================================== [facility] COMP DRUG ANALYSIS,UR ==================================================================== Test                             Result       Flag       Units Drug Present not Declared for Prescription Verification   Acetaminophen                  PRESENT      UNEXPECTED Drug Absent but Declared for Prescription Verification   Oxycodone                      Not Detected UNEXPECTED ng/mg creat   Gabapentin                     Not Detected UNEXPECTED   Tizanidine                     Not Detected UNEXPECTED    Tizanidine, as indicated in the declared medication list, is not    always detected even when used as directed.   Amitriptyline                  Not Detected UNEXPECTED ==================================================================== Test                      Result    Flag   Units      Ref Range   Creatinine              131              mg/dL      >=20 ==================================================================== Declared Medications:  The flagging and interpretation on this report are based on the  following declared medications.  Unexpected results may arise from  inaccuracies in the declared medications.  **Note: The testing scope of this panel includes these medications:  Amitriptyline  Gabapentin  Oxycodone  **Note: The testing scope of this panel does not include small to  moderate amounts of these  reported medications:  Tizanidine  **Note: The testing scope of this panel does not include following  reported medications:  Albuterol  Betamethasone  Meloxicam  Prednisone ==================================================================== For clinical consultation, please call ([phone]====    UDS interpretation: Compliant          Medication Assessment Form: Reviewed. Patient indicates being compliant with therapy Treatment compliance: Compliant Risk Assessment Profile: Aberrant behavior: See prior evaluations. None observed or detected today Comorbid factors increasing risk of overdose: See prior notes. No additional risks detected today Risk of substance use disorder (SUD): Low Opioid Risk Tool - 06/08/17 1333      Family History of Substance Abuse   Alcohol  Positive Male    Illegal Drugs  Positive Male    Rx Drugs  Negative      Personal History of Substance Abuse   Alcohol  Negative    Illegal Drugs  Negative  Rx Drugs  Negative      Age   Age between 50-45 years   No      History of Preadolescent Sexual Abuse   History of Preadolescent Sexual Abuse  Negative or Male      Psychological Disease   Psychological Disease  Negative    Depression  Negative      Total Score   Opioid Risk Tool Scoring  6    Opioid Risk Interpretation  Moderate Risk      ORT Scoring interpretation table:  Score <3 = Low Risk for SUD  Score between 4-7 = Moderate Risk for SUD  Score >8 = High Risk for Opioid Abuse   Risk Mitigation Strategies:  Patient Counseling: Covered Patient-Prescriber Agreement (PPA): Present and active  Notification to other healthcare providers: Done  Pharmacologic Plan: No change in therapy, at this time.             Post-Procedure Assessment  05/30/2017 Procedure: Left sacroiliac joint injection Pre-procedure pain score:  6/10 Post-procedure pain score: 6/10          Influential Factors: BMI: 42.86 kg/m Intra-procedural challenges: None observed.         Assessment challenges: None detected.              Reported side-effects: None.        Post-procedural adverse reactions or complications: None reported         Sedation: Please see nurses note. When no sedatives are used, the analgesic levels obtained are directly associated to the effectiveness of the local anesthetics. However, when sedation is provided, the level of analgesia obtained during the initial 1 hour following the intervention, is believed to be the result of a combination of factors. These factors may include, but are not limited to: 1. The effectiveness of the local anesthetics used. 2. The effects of the analgesic(s) and/or anxiolytic(s) used. 3. The degree of discomfort experienced by the patient at the time of the procedure. 4. The patients ability and reliability in recalling and recording the events. 5. The presence and influence of possible secondary gains and/or psychosocial factors. Reported result: Relief experienced during the 1st hour after the procedure: 0 % (Ultra-Short Term Relief)            Interpretative annotation: Clinically appropriate result. Analgesia during this period is likely to be Local Anesthetic and/or IV Sedative (Analgesic/Anxiolytic) related.          Effects of local anesthetic: The analgesic effects attained during this period are directly associated to the localized infiltration of local anesthetics and therefore cary significant diagnostic value as to the etiological location, or anatomical origin, of the pain. Expected duration of relief is directly dependent on the pharmacodynamics of the local anesthetic used. Long-acting (4-6 hours) anesthetics used.  Reported result: Relief during the next 4 to 6 hour after the procedure: 40 % (Short-Term Relief)            Interpretative annotation: Clinically appropriate result. Analgesia during this period is likely  to be Local Anesthetic-related.          Long-term benefit: Defined as the period of time past the expected duration of local anesthetics (1 hour for short-acting and 4-6 hours for long-acting). With the possible exception of prolonged sympathetic blockade from the local anesthetics, benefits during this period are typically attributed to, or associated with, other factors such as analgesic sensory neuropraxia, antiinflammatory effects, or beneficial biochemical changes provided by agents other than the  local anesthetics.  Reported result: Extended relief following procedure: 40 % (Long-Term Relief)            Interpretative annotation: Clinically appropriate result. Good relief. No permanent benefit expected. Inflammation plays a part in the etiology to the pain.          Current benefits: Defined as reported results that persistent at this point in time.   Analgesia: 25-50 %            Function: Somewhat improved ROM: Somewhat improved Interpretative annotation: Recurrence of symptoms. No permanent benefit expected. Effective diagnostic intervention.          Interpretation: Results would suggest a successful diagnostic intervention. We'll proceed with the next treatment, as soon as convenient          Plan:  Please see "Plan of Care" for details.                Laboratory Chemistry  Inflammation Markers (CRP: Acute Phase) (ESR: Chronic Phase) No results found for: CRP, ESRSEDRATE, LATICACIDVEN                       Rheumatology Markers No results found for: RF, ANA, LABURIC, URICUR, LYMEIGGIGMAB, [facility]              Renal Function Markers Lab Results  Component Value Date   BUN 11 01/01/2017   CREATININE 0.53 (L) 01/01/2017   GFRAA >60 01/01/2017   GFRNONAA >60 01/01/2017                 Hepatic Function Markers No results found for: AST, ALT, ALBUMIN, ALKPHOS, HCVAB, AMYLASE, LIPASE, AMMONIA               Electrolytes Lab Results  Component Value Date   NA 138  01/01/2017   K 4.1 01/01/2017   CL 102 01/01/2017   CALCIUM 9.5 01/01/2017                        Neuropathy Markers No results found for: VITAMINB12, FOLATE, HGBA1C, HIV               Bone Pathology Markers No results found for: VD25OH, VD125OH2TOT, [id number], FA2130QM5, 25OHVITD1, 25OHVITD2, 25OHVITD3, TESTOFREE, TESTOSTERONE                       Coagulation Parameters Lab Results  Component Value Date   PLT 218 01/01/2017                 Cardiovascular Markers Lab Results  Component Value Date   CKTOTAL 135 04/07/2012   CKMB 0.7 04/07/2012   TROPONINI <0.03 01/01/2017   HGB 15.8 01/01/2017   HCT 44.6 01/01/2017                 CA Markers No results found for: CEA, CA125, LABCA2               Note: Lab results reviewed.  Recent Diagnostic Imaging Results  DG C-Arm 1-60 Min-No Report Fluoroscopy was utilized by the requesting physician.  No radiographic  interpretation.   Complexity Note: Imaging results reviewed. Results shared with Mr. Picotte, using Layman's terms.                         Meds   Current Outpatient Medications:  .  albuterol (PROVENTIL HFA;VENTOLIN HFA) 108 (90 Base) MCG/ACT inhaler, Inhale  2 puffs into the lungs every 6 (six) hours as needed for wheezing or shortness of breath., Disp: 1 Inhaler, Rfl: 2 .  gabapentin (NEURONTIN) 600 MG tablet, 1200 mg  BID, Disp: 120 tablet, Rfl: 1 .  Oxycodone HCl 10 MG TABS, Take 1 tablet (10 mg total) by mouth every 6 (six) hours as needed. For chronic pain, to last for 30 days from fill date  To fill on or after: 06/17/2017, 07/17/2017, 08/15/2017, Disp: 120 tablet, Rfl: 0 .  tiZANidine (ZANAFLEX) 4 MG tablet, Take 1 tablet (4 mg total) by mouth every 8 (eight) hours as needed for muscle spasms., Disp: 90 tablet, Rfl: 3  ROS  Constitutional: Denies any fever or chills Gastrointestinal: No reported hemesis, hematochezia, vomiting, or acute GI distress Musculoskeletal: Denies any acute onset joint swelling,  redness, loss of ROM, or weakness Neurological: No reported episodes of acute onset apraxia, aphasia, dysarthria, agnosia, amnesia, paralysis, loss of coordination, or loss of consciousness  Allergies  Mr. Monday is allergic to tramadol.  PFSH  Drug: Mr. Matlack  reports that he does not use drugs. Alcohol:  reports that he does not drink alcohol. Tobacco:  reports that he has been smoking cigarettes.  He has been smoking about 0.50 packs per day. He has quit using smokeless tobacco. Medical:  has a past medical history of Depression and Panic attack. Surgical: Mr. Hatlestad  has a past surgical history that includes Leg amputation; Hand surgery; and Leg Surgery. Family: family history includes Alzheimer's disease in his maternal grandmother; Cancer in his maternal grandfather, mother, and paternal grandmother; Diabetes in his father; Heart disease in his father; Hypertension in his father.  Constitutional Exam  General appearance: Well nourished, well developed, and well hydrated. In no apparent acute distress Vitals:   06/08/17 1327  BP: (!) 164/84  Pulse: (!) 106  Resp: 18  Temp: 98.2 F (36.8 C)  TempSrc: Oral  SpO2: 98%  Weight: (!) 316 lb (143.3 kg)  Height: 6' (1.829 m)   BMI Assessment: Estimated body mass index is 42.86 kg/m as calculated from the following:   Height as of this encounter: 6' (1.829 m).   Weight as of this encounter: 316 lb (143.3 kg).  BMI interpretation table: BMI level Category Range association with higher incidence of chronic pain  <18 kg/m2 Underweight   18.5-24.9 kg/m2 Ideal body weight   25-29.9 kg/m2 Overweight Increased incidence by 20%  30-34.9 kg/m2 Obese (Class I) Increased incidence by 68%  35-39.9 kg/m2 Severe obesity (Class II) Increased incidence by 136%  >40 kg/m2 Extreme obesity (Class III) Increased incidence by 254%   BMI Readings from Last 4 Encounters:  06/08/17 42.86 kg/m  05/08/17 43.67 kg/m  04/18/17 43.81 kg/m   03/29/17 45.43 kg/m   Wt Readings from Last 4 Encounters:  06/08/17 (!) 316 lb (143.3 kg)  05/08/17 (!) 322 lb (146.1 kg)  04/18/17 (!) 323 lb (146.5 kg)  03/29/17 (!) 335 lb (152 kg)  Psych/Mental status: Alert, oriented x 3 (person, place, & time)       Eyes: PERLA Respiratory: No evidence of acute respiratory distress  Cervical Spine Area Exam  Skin & Axial Inspection: No masses, redness, edema, swelling, or associated skin lesions Alignment: Symmetrical Functional ROM: Unrestricted ROM      Stability: No instability detected Muscle Tone/Strength: Functionally intact. No obvious neuro-muscular anomalies detected. Sensory (Neurological): Unimpaired Palpation: No palpable anomalies              Upper Extremity (UE)  Exam    Side: Right upper extremity  Side: Left upper extremity  Skin & Extremity Inspection: Skin color, temperature, and hair growth are WNL. No peripheral edema or cyanosis. No masses, redness, swelling, asymmetry, or associated skin lesions. No contractures.  Skin & Extremity Inspection: Skin color, temperature, and hair growth are WNL. No peripheral edema or cyanosis. No masses, redness, swelling, asymmetry, or associated skin lesions. No contractures.  Functional ROM: Unrestricted ROM          Functional ROM: Unrestricted ROM          Muscle Tone/Strength: Functionally intact. No obvious neuro-muscular anomalies detected.  Muscle Tone/Strength: Functionally intact. No obvious neuro-muscular anomalies detected.  Sensory (Neurological): Unimpaired          Sensory (Neurological): Unimpaired          Palpation: No palpable anomalies              Palpation: No palpable anomalies              Specialized Test(s): Deferred         Specialized Test(s): Deferred          Thoracic Spine Area Exam  Skin & Axial Inspection: No masses, redness, or swelling Alignment: Symmetrical Functional ROM: Unrestricted ROM Stability: No instability detected Muscle Tone/Strength:  Functionally intact. No obvious neuro-muscular anomalies detected. Sensory (Neurological): Unimpaired Muscle strength & Tone: No palpable anomalies  Lumbar Spine Area Exam  Skin & Axial Inspection: No masses, redness, or swelling Alignment: Symmetrical Functional ROM: Unrestricted ROM      Stability: No instability detected Muscle Tone/Strength: Functionally intact. No obvious neuro-muscular anomalies detected. Sensory (Neurological): Unimpaired Palpation: No palpable anomalies       Provocative Tests: Lumbar Hyperextension and rotation test: Positive on the left for facet joint pain. Lumbar Lateral bending test: evaluation deferred today       Patrick's Maneuver: Positive for left-sided S-I arthralgia               Gait & Posture Assessment  Ambulation:Limited Gait:Left leg prosthesis in place. Posture:Difficulty with positional changes   Lower Extremity Exam    Side:Right lower extremity  Side:Left lower extremity  Skin & Extremity Inspection:Skin color, temperature, and hair growth are WNL. No peripheral edema or cyanosis. No masses, redness, swelling, asymmetry, or associated skin lesions. No contractures.  Skin & Extremity Inspection:Below knee amputation (BKA)  Functional IYM:EBRAXENMMHWK ROM  Functional GSU:PJSRPRXYVOPF ROM  Muscle Tone/Strength:Functionally intact. No obvious neuro-muscular anomalies detected.  Muscle Tone/Strength:Functionally intact. No obvious neuro-muscular anomalies detected.  Sensory (Neurological):Unimpaired  Sensory (Neurological):Neurogenic pain pattern  Palpation:No palpable anomalies  Palpation:Tender  Allodynia, stump pain, phantom pain at left stump site. No erythema or discharge noted.   Assessment  Primary Diagnosis & Pertinent Problem List: The primary encounter diagnosis was Lumbar spondylosis. Diagnoses of SI joint arthritis ([place]), S/P BKA (below knee amputation) unilateral, left ([place]), Left hip  pain, and Chronic pain syndrome were also pertinent to this visit.  Status Diagnosis  Worsening Persistent Persistent 1. Lumbar spondylosis   2. 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 S/P BKA (below knee amputation) unilateral, left ([place])   4. Left hip pain   5. Chronic pain syndrome       General Recommendations: The pain condition that the patient suffers from is best treated with a multidisciplinary approach that involves an increase in physical activity to prevent de-conditioning and worsening of the pain cycle, as well as psychological counseling (formal and/or informal) to address the  co-morbid psychological affects of pain. Treatment will often involve judicious use of pain medications and interventional procedures to decrease the pain, allowing the patient to participate in the physical activity that will ultimately produce long-lasting pain reductions. The goal of the multidisciplinary approach is to return the patient to a higher level of overall function and to restore their ability to perform activities of daily living.  57 year old male who presents with left stump pain, left phantom pain status post left below the knee amputation performed in 2013. Patient also has radiating left hip pain as well that starts in his buttock region around his piriformis and radiates anteriorly into his groin.  On exam patient does have positive Patrick's and Faber's test on the left.  Patient is status post left sacroiliac joint injection on 05/08/2017 which provided him significant benefit for the first 2 weeks after the injection.  Patient states that he was able to perform activities of daily living with greater ease.  He was able to work outside for an extended period of time.  He states that he is now having return of his buttock and groin pain as well as new onset, moderate to severe axial low back pain most pronounced on the left.  This pain is reproducible with lumbar facet loading and lumbar extension  suggesting a facet mediated problem. The patient has tried various conservative therapeutic options such as NSAIDs, Tylenol, muscle relaxants, physical therapy which was inadequately effective.  Patient's pain is predominantly axial with physical exam findings suggestive of facet arthropathy.We discussed performing L3, L4, L5 lumbar facet medial branch nerve block on the left along with repeating left SI joint injection for SI joint arthritis.  Risks and benefits were discussed and patient would like to proceed.  In regards to medications, patient is taking his oxycodone as prescribed.  [place] PMP checked and appropriate.  Patient did not bring his medication bottle to be counted.  We will hold onto his new prescriptions until the patient is able to return on Monday for medication count at which point his new prescriptions will be released to him if his medication count is appropriate.  Lumbar facet medial branch nerve blocks were discussed with the patient.  Risks and benefits were reviewed.  Patient would like to proceed with bilateral L3, L4, L5 medial branch nerve block.  Plan: -Prescription for oxycodone 10 mg 4 times daily as needed.  Prescription provided for 3 months.  Patient will pick up on Monday when he brings medication bottle in for medication count. -Continue gabapentin 1200 mg twice daily, Zanaflex 4 mill grams 3 times daily as needed.  No refills needed. -Scheduled for repeat left SI joint injection under fluoroscopy with sedation for SI joint arthralgia and arthritis -Scheduled for left L3, L4, L5 lumbar facet medial branch nerve block for lumbar spondylosis.   Plan of Care  Pharmacotherapy (Medications Ordered): Meds ordered this encounter  Medications  . DISCONTD: Oxycodone HCl 10 MG TABS    Sig: Take 1 tablet (10 mg total) by mouth every 6 (six) hours as needed. For chronic pain, to last for 30 days from fill date  To fill on or after: 06/17/2017, 07/17/2017, 08/15/2017    Dispense:   120 tablet    Refill:  0    Do not place this medication, or any other prescription from our practice, on "Automatic Refill". Patient may have prescription filled one day early if pharmacy is closed on scheduled refill date.  Marland Kitchen DISCONTD: Oxycodone HCl 10 MG  TABS    Sig: Take 1 tablet (10 mg total) by mouth every 6 (six) hours as needed. For chronic pain, to last for 30 days from fill date  To fill on or after: 06/17/2017, 07/17/2017, 08/15/2017    Dispense:  120 tablet    Refill:  0    Do not place this medication, or any other prescription from our practice, on "Automatic Refill". Patient may have prescription filled one day early if pharmacy is closed on scheduled refill date.  . Oxycodone HCl 10 MG TABS    Sig: Take 1 tablet (10 mg total) by mouth every 6 (six) hours as needed. For chronic pain, to last for 30 days from fill date  To fill on or after: 06/17/2017, 07/17/2017, 08/15/2017    Dispense:  120 tablet    Refill:  0    Do not place this medication, or any other prescription from our practice, on "Automatic Refill". Patient may have prescription filled one day early if pharmacy is closed on scheduled refill date.   Lab-work, procedure(s), and/or referral(s): Orders Placed This Encounter  Procedures  . LUMBAR FACET(MEDIAL BRANCH NERVE BLOCK) [facility]  . SACROILIAC JOINT INJECTION   Provider-requested follow-up: Return in about 2 weeks (around 06/22/2017) for Procedure.  Future Appointments  Date Time Provider [place]  06/21/2017  9:15 AM Gillis Santa, MD [facility] None  09/05/2017  8:45 AM Gillis Santa, MD [facility] None  12/05/2017  8:30 AM Volney American, PA-C [facility]    Primary Care Physician: Volney American, PA-C Location: [facility] Note by: Gillis Santa, M.D Date: 06/08/2017; Time: 2:22 PM Time Note: Greater than 50% of the 25 minute(s) of face-to-face time spent with [name], was spent in counseling/coordination of  care regarding: Mr. Allbritton primary cause of pain, the treatment plan, treatment alternatives, the risks and possible complications of proposed treatment, medication side effects, going over the informed consent, the opioid analgesic risks and possible complications, the results, interpretation and significance of  his recent diagnostic interventional treatment(s), the appropriate use of his medications, realistic expectations, the goals of pain management (increased in functionality), the medication agreement and the patient's responsibilities when it comes to controlled substances.   Patient Instructions   Sacroiliac (SI) Joint Injection Patient Information  Description: The sacroiliac joint connects the scrum (very low back and tailbone) to the ilium (a pelvic bone which also forms half of the hip joint).  Normally this joint experiences very little motion.  When this joint becomes inflamed or unstable low back and or hip and pelvis pain may result.  Injection of this joint with local anesthetics (numbing medicines) and steroids can provide diagnostic information and reduce pain.  This injection is performed with the aid of x-ray guidance into the tailbone area while you are lying on your stomach.   You may experience an electrical sensation down the leg while this is being done.  You may also experience numbness.  We also may ask if we are reproducing your normal pain during the injection.  Conditions which may be treated SI injection:   Low back, buttock, hip or leg pain  Preparation for the Injection:  1. Do not eat any solid food or dairy products within 8 hours of your appointment.  2. You may drink clear liquids up to 3 hours before appointment.  Clear liquids include water, black coffee, juice or soda.  No milk or cream please. 3. You may take your regular medications, including  pain medications with a sip of water before your appointment.  Diabetics should hold regular insulin (if  take separately) and take 1/2 normal NPH dose the morning of the procedure.  Carry some sugar containing items with you to your appointment. 4. A driver must accompany you and be prepared to drive you home after your procedure. 5. Bring all of your current medications with you. 6. An IV may be inserted and sedation may be given at the discretion of the physician. 7. A blood pressure cuff, EKG and other monitors will often be applied during the procedure.  Some patients may need to have extra oxygen administered for a short period.  8. You will be asked to provide medical information, including your allergies, prior to the procedure.  We must know immediately if you are taking blood thinners (like Coumadin/Warfarin) or if you are allergic to IV iodine contrast (dye).  We must know if you could possible be pregnant.  Possible side effects:   Bleeding from needle site  Infection (rare, may require surgery)  Nerve injury (rare)  Numbness & tingling (temporary)  A brief convulsion or seizure  Light-headedness (temporary)  Pain at injection site (several days)  Decreased blood pressure (temporary)  Weakness in the leg (temporary)   Call if you experience:   New onset weakness or numbness of an extremity below the injection site that last more than 8 hours.  Hives or difficulty breathing ( go to the emergency room)  Inflammation or drainage at the injection site  Any new symptoms which are concerning to you  Please note:  Although the local anesthetic injected can often make your back/ hip/ buttock/ leg feel good for several hours after the injections, the pain will likely return.  It takes 3-7 days for steroids to work in the sacroiliac area.  You may not notice any pain relief for at least that one week.  If effective, we will often do a series of three injections spaced 3-6 weeks apart to maximally decrease your pain.  After the initial series, we generally will wait some  months before a repeat injection of the same type.  If you have any questions, please call [phone] [place]  What are the risk, side effects and possible complications? Generally speaking, most procedures are safe.  However, with any procedure there are risks, side effects, and the possibility of complications.  The risks and complications are dependent upon the sites that are lesioned, or the type of nerve block to be performed.  The closer the procedure is to the spine, the more serious the risks are.  Great care is taken when placing the radio frequency needles, block needles or lesioning probes, but sometimes complications can occur. 1. Infection: Any time there is an injection through the skin, there is a risk of infection.  This is why sterile conditions are used for these blocks.  There are four possible types of infection. 1. Localized skin infection. 2. Central Nervous System Infection-This can be in the form of Meningitis, which can be deadly. 3. Epidural Infections-This can be in the form of an epidural abscess, which can cause pressure inside of the spine, causing compression of the spinal cord with subsequent paralysis. This would require an emergency surgery to decompress, and there are no guarantees that the patient would recover from the paralysis. 4. Discitis-This is an infection of the intervertebral discs.  It occurs in about 1% of  discography procedures.  It is difficult to treat and it may lead to surgery.        2. Pain: the needles have to go through skin and soft tissues, will cause soreness.       3. Damage to internal structures:  The nerves to be lesioned may be near blood vessels or    other nerves which can be potentially damaged.       4. Bleeding: Bleeding is more common if the patient is taking blood thinners such as  aspirin, Coumadin, Ticiid, Plavix, etc., or if he/she have some genetic  predisposition  such as hemophilia. Bleeding into the spinal canal can cause compression of the spinal  cord with subsequent paralysis.  This would require an emergency surgery to  decompress and there are no guarantees that the patient would recover from the  paralysis.       5. Pneumothorax:  Puncturing of a lung is a possibility, every time a needle is introduced in  the area of the chest or upper back.  Pneumothorax refers to free air around the  collapsed lung(s), inside of the thoracic cavity (chest cavity).  Another two possible  complications related to a similar event would include: Hemothorax and Chylothorax.   These are variations of the Pneumothorax, where instead of air around the collapsed  lung(s), you may have blood or chyle, respectively.       6. Spinal headaches: They may occur with any procedures in the area of the spine.       7. Persistent CSF (Cerebro-Spinal Fluid) leakage: This is a rare problem, but may occur  with prolonged intrathecal or epidural catheters either due to the formation of a fistulous  track or a dural tear.       8. Nerve damage: By working so close to the spinal cord, there is always a possibility of  nerve damage, which could be as serious as a permanent spinal cord injury with  paralysis.       9. Death:  Although rare, severe deadly allergic reactions known as "Anaphylactic  reaction" can occur to any of the medications used.      10. Worsening of the symptoms:  We can always make thing worse.  What are the chances of something like this happening? Chances of any of this occuring are extremely low.  By statistics, you have more of a chance of getting killed in a motor vehicle accident: while driving to the hospital than any of the above occurring .  Nevertheless, you should be aware that they are possibilities.  In general, it is similar to taking a shower.  Everybody knows that you can slip, hit your head and get killed.  Does that mean that you should not  shower again?  Nevertheless always keep in mind that statistics do not mean anything if you happen to be on the wrong side of them.  Even if a procedure has a 1 (one) in a 1,000,000 (million) chance of going wrong, it you happen to be that one..Also, keep in mind that by statistics, you have more of a chance of having something go wrong when taking medications.  Who should not have this procedure? If you are on a blood thinning medication (e.g. Coumadin, Plavix, see list of "Blood Thinners"), or if you have an active infection going on, you should not have the procedure.  If you are taking any blood thinners, please inform your physician.  How should I prepare for  this procedure?  Do not eat or drink anything at least six hours prior to the procedure.  Bring a driver with you .  It cannot be a taxi.  Come accompanied by an adult that can drive you back, and that is strong enough to help you if your legs get weak or numb from the local anesthetic.  Take all of your medicines the morning of the procedure with just enough water to swallow them.  If you have diabetes, make sure that you are scheduled to have your procedure done first thing in the morning, whenever possible.  If you have diabetes, take only half of your insulin dose and notify our nurse that you have done so as soon as you arrive at the clinic.  If you are diabetic, but only take blood sugar pills (oral hypoglycemic), then do not take them on the morning of your procedure.  You may take them after you have had the procedure.  Do not take aspirin or any aspirin-containing medications, at least eleven (11) days prior to the procedure.  They may prolong bleeding.  Wear loose fitting clothing that may be easy to take off and that you would not mind if it got stained with Betadine or blood.  Do not wear any jewelry or perfume  Remove any nail coloring.  It will interfere with some of our monitoring equipment.  NOTE: Remember that  this is not meant to be interpreted as a complete list of all possible complications.  Unforeseen problems may occur.  BLOOD THINNERS The following drugs contain aspirin or other products, which can cause increased bleeding during surgery and should not be taken for 2 weeks prior to and 1 week after surgery.  If you should need take something for relief of minor pain, you may take acetaminophen which is found in Tylenol,m Datril, Anacin-3 and Panadol. It is not blood thinner. The products listed below are.  Do not take any of the products listed below in addition to any listed on your instruction sheet.  A.P.C or A.P.C with Codeine Codeine Phosphate Capsules #3 Ibuprofen Ridaura  ABC compound Congesprin Imuran rimadil  Advil Cope Indocin Robaxisal  Alka-Seltzer Effervescent Pain Reliever and Antacid Coricidin or Coricidin-D  Indomethacin Rufen  Alka-Seltzer plus Cold Medicine Cosprin Ketoprofen S-A-C Tablets  Anacin Analgesic Tablets or Capsules Coumadin Korlgesic Salflex  Anacin Extra Strength Analgesic tablets or capsules CP-2 Tablets Lanoril Salicylate  Anaprox Cuprimine Capsules Levenox Salocol  Anexsia-D Dalteparin Magan Salsalate  Anodynos Darvon compound Magnesium Salicylate Sine-off  Ansaid Dasin Capsules Magsal Sodium Salicylate  Anturane Depen Capsules Marnal Soma  APF Arthritis pain formula Dewitt's Pills Measurin Stanback  Argesic Dia-Gesic Meclofenamic Sulfinpyrazone  Arthritis Bayer Timed Release Aspirin Diclofenac Meclomen Sulindac  Arthritis pain formula Anacin Dicumarol Medipren Supac  Analgesic (Safety coated) Arthralgen Diffunasal Mefanamic Suprofen  Arthritis Strength Bufferin Dihydrocodeine Mepro Compound Suprol  Arthropan liquid Dopirydamole Methcarbomol with Aspirin Synalgos  ASA tablets/Enseals Disalcid Micrainin Tagament  Ascriptin Doan's Midol Talwin  Ascriptin A/D Dolene Mobidin Tanderil  Ascriptin Extra Strength Dolobid Moblgesic Ticlid  Ascriptin with Codeine  Doloprin or Doloprin with Codeine Momentum Tolectin  Asperbuf Duoprin Mono-gesic Trendar  Aspergum Duradyne Motrin or Motrin IB Triminicin  Aspirin plain, buffered or enteric coated Durasal Myochrisine Trigesic  Aspirin Suppositories Easprin Nalfon Trillsate  Aspirin with Codeine Ecotrin Regular or Extra Strength Naprosyn Uracel  Atromid-S Efficin Naproxen Ursinus  Auranofin Capsules Elmiron Neocylate Vanquish  Axotal Emagrin Norgesic Verin  Azathioprine Empirin or Empirin with Codeine  Normiflo Vitamin E  Azolid Emprazil Nuprin Voltaren  Bayer Aspirin plain, buffered or children's or timed BC Tablets or powders Encaprin Orgaran Warfarin Sodium  Buff-a-Comp Enoxaparin Orudis Zorpin  Buff-a-Comp with Codeine Equegesic Os-Cal-Gesic   Buffaprin Excedrin plain, buffered or Extra Strength Oxalid   Bufferin Arthritis Strength Feldene Oxphenbutazone   Bufferin plain or Extra Strength Feldene Capsules Oxycodone with Aspirin   Bufferin with Codeine Fenoprofen Fenoprofen Pabalate or Pabalate-SF   Buffets II Flogesic Panagesic   Buffinol plain or Extra Strength Florinal or Florinal with Codeine Panwarfarin   Buf-Tabs Flurbiprofen Penicillamine   Butalbital Compound Four-way cold tablets Penicillin   Butazolidin Fragmin Pepto-Bismol   Carbenicillin Geminisyn Percodan   Carna Arthritis Reliever Geopen Persantine   Carprofen Gold's salt Persistin   Chloramphenicol Goody's Phenylbutazone   Chloromycetin Haltrain Piroxlcam   Clmetidine heparin Plaquenil   Cllnoril Hyco-pap Ponstel   Clofibrate Hydroxy chloroquine Propoxyphen         Before stopping any of these medications, be sure to consult the physician who ordered them.  Some, such as Coumadin (Warfarin) are ordered to prevent or treat serious conditions such as "deep thrombosis", "pumonary embolisms", and other heart problems.  The amount of time that you may need off of the medication may also vary with the medication and the reason for which  you were taking it.  If you are taking any of these medications, please make sure you notify your pain physician before you undergo any procedures.         Facet Joint Block The facet joints connect the bones of the spine (vertebrae). They make it possible for you to bend, twist, and make other movements with your spine. They also keep you from bending too far, twisting too far, and making other excessive movements. A facet joint block is a procedure where a numbing medicine (anesthetic) is injected into a facet joint. Often, a type of anti-inflammatory medicine called a steroid is also injected. A facet joint block may be done to diagnose neck or back pain. If the pain gets better after a facet joint block, it means the pain is probably coming from the facet joint. If the pain does not get better, it means the pain is probably not coming from the facet joint. A facet joint block may also be done to relieve neck or back pain caused by an inflamed facet joint. A facet joint block is only done to relieve pain if the pain does not improve with other methods, such as medicine, exercise programs, and physical therapy. Tell a health care provider about:  Any allergies you have.  All medicines you are taking, including vitamins, herbs, eye drops, creams, and over-the-counter medicines.  Any problems you or family members have had with anesthetic medicines.  Any blood disorders you have.  Any surgeries you have had.  Any medical conditions you have.  Whether you are pregnant or may be pregnant. What are the risks? Generally, this is a safe procedure. However, problems may occur, including:  Bleeding.  Injury to a nerve near the injection site.  Pain at the injection site.  Weakness or numbness in areas controlled by nerves near the injection site.  Infection.  Temporary fluid retention.  Allergic reactions to medicines or dyes.  Injury to other structures or organs near the  injection site.  What happens before the procedure?  Follow instructions from your health care provider about eating or drinking restrictions.  Ask your health care provider about: ? Changing  or stopping your regular medicines. This is especially important if you are taking diabetes medicines or blood thinners. ? Taking medicines such as aspirin and ibuprofen. These medicines can thin your blood. Do not take these medicines before your procedure if your health care provider instructs you not to.  Do not take any new dietary supplements or medicines without asking your health care provider first.  Plan to have someone take you home after the procedure. What happens during the procedure?  You may need to remove your clothing and dress in an open-back gown.  The procedure will be done while you are lying on an X-ray table. You will most likely be asked to lie on your stomach, but you may be asked to lie in a different position if an injection will be made in your neck.  Machines will be used to monitor your oxygen levels, heart rate, and blood pressure.  If an injection will be made in your neck, an IV tube will be inserted into one of your veins. Fluids and medicine will flow directly into your body through the IV tube.  The area over the facet joint where the injection will be made will be cleaned with soap. The surrounding skin will be covered with clean drapes.  A numbing medicine (local anesthetic) will be applied to your skin. Your skin may sting or burn for a moment.  A video X-ray machine (fluoroscopy) will be used to locate the joint. In some cases, a CT scan may be used.  A contrast dye may be injected into the facet joint area to help locate the joint.  When the joint is located, an anesthetic will be injected into the joint through the needle.  Your health care provider will ask you whether you feel pain relief. If you do feel relief, a steroid may be injected to provide  pain relief for a longer period of time. If you do not feel relief or feel only partial relief, additional injections of an anesthetic may be made in other facet joints.  The needle will be removed.  Your skin will be cleaned.  A bandage (dressing) will be applied over each injection site. The procedure may vary among health care providers and hospitals. What happens after the procedure?  You will be observed for 15-30 minutes before being allowed to go home. This information is not intended to replace advice given to you by your health care provider. Make sure you discuss any questions you have with your health care provider. Document Released: 08/17/2006 Document Revised: 04/29/2015 Document Reviewed: 12/22/2014 Elsevier Interactive Patient Education  [employer].

## 2017-06-13 ENCOUNTER — Ambulatory Visit: Payer: Medicaid Other | Admitting: Student in an Organized Health Care Education/Training Program

## 2017-06-13 NOTE — Progress Notes (Signed)
Nursing Pain Medication Assessment:  Safety precautions to be maintained throughout the outpatient stay will include: orient to surroundings, keep bed in low position, maintain call bell within reach at all times, provide assistance with transfer out of bed and ambulation.  Medication Inspection Compliance: Pill count conducted under aseptic conditions, in front of the patient. Neither the pills nor the bottle was removed from the patient's sight at any time. Once count was completed pills were immediately returned to the patient in their original bottle.  Medication: Oxycodone IR Pill/Patch Count: 7 of 120 pills remain Pill/Patch Appearance: Markings consistent with prescribed medication Bottle Appearance: Standard pharmacy container. Clearly labeled. Filled Date: 02 / 07 / 2019 Last Medication intake:  Today  Dr. Holley Raring aware that patient's pill count was short for two days worth of medication. Patient understands that next script cannot be filled until the 06/17/2017 date. Also aware that pill count must be accurate at next visit to avoid violation of his medication contract with the pain clinic.

## 2017-06-21 ENCOUNTER — Ambulatory Visit: Payer: Medicaid Other | Admitting: Student in an Organized Health Care Education/Training Program

## 2017-06-21 ENCOUNTER — Ambulatory Visit
Admission: RE | Admit: 2017-06-21 | Discharge: 2017-06-21 | Disposition: A | Discharge status: Home / Self Care | Payer: Medicaid Other | Source: Ambulatory Visit | Attending: Student in an Organized Health Care Education/Training Program | Admitting: Student in an Organized Health Care Education/Training Program

## 2017-06-21 ENCOUNTER — Encounter: Payer: Self-pay | Admitting: Student in an Organized Health Care Education/Training Program

## 2017-06-21 ENCOUNTER — Other Ambulatory Visit: Payer: Self-pay

## 2017-06-21 DIAGNOSIS — Z89619 Acquired absence of unspecified leg above knee: Secondary | ICD-10-CM | POA: Diagnosis not present

## 2017-06-21 DIAGNOSIS — Z888 Allergy status to other drugs, medicaments and biological substances status: Secondary | ICD-10-CM | POA: Insufficient documentation

## 2017-06-21 DIAGNOSIS — M545 Low back pain: Secondary | ICD-10-CM | POA: Diagnosis not present

## 2017-06-21 DIAGNOSIS — M542 Cervicalgia: Secondary | ICD-10-CM | POA: Insufficient documentation

## 2017-06-21 DIAGNOSIS — M4698 Unspecified inflammatory spondylopathy, sacral and sacrococcygeal region: Secondary | ICD-10-CM

## 2017-06-21 DIAGNOSIS — M25552 Pain in left hip: Secondary | ICD-10-CM | POA: Diagnosis not present

## 2017-06-21 DIAGNOSIS — M47816 Spondylosis without myelopathy or radiculopathy, lumbar region: Secondary | ICD-10-CM

## 2017-06-21 DIAGNOSIS — M25512 Pain in left shoulder: Secondary | ICD-10-CM | POA: Diagnosis not present

## 2017-06-21 DIAGNOSIS — Z885 Allergy status to narcotic agent status: Secondary | ICD-10-CM | POA: Diagnosis not present

## 2017-06-21 DIAGNOSIS — Z9889 Other specified postprocedural states: Secondary | ICD-10-CM | POA: Diagnosis not present

## 2017-06-21 DIAGNOSIS — M47818 Spondylosis without myelopathy or radiculopathy, sacral and sacrococcygeal region: Secondary | ICD-10-CM

## 2017-06-21 MED ORDER — ROPIVACAINE HCL 2 MG/ML IJ SOLN
10.0000 mL | Freq: Once | INTRAMUSCULAR | Status: AC
Start: 2017-06-21 — End: 2017-06-21
  Administered 2017-06-21: 10 mL
  Filled 2017-06-21: qty 10

## 2017-06-21 MED ORDER — LACTATED RINGERS IV SOLN: 1000.0000 mL | Freq: Once | INTRAVENOUS | Status: DC

## 2017-06-21 MED ORDER — LIDOCAINE HCL 1 % IJ SOLN
10.0000 mL | Freq: Once | INTRAMUSCULAR | Status: AC
  Administered 2017-06-21: 5 mL
  Filled 2017-06-21: qty 10

## 2017-06-21 MED ORDER — FENTANYL CITRATE (PF) 100 MCG/2ML IJ SOLN: 25.0000 ug | INTRAMUSCULAR | Status: DC | PRN

## 2017-06-21 MED ORDER — DEXAMETHASONE SODIUM PHOSPHATE 10 MG/ML IJ SOLN
10.0000 mg | Freq: Once | INTRAMUSCULAR | Status: AC
  Administered 2017-06-21: 10 mg
  Filled 2017-06-21: qty 1

## 2017-06-21 MED ORDER — GABAPENTIN 600 MG PO TABS: ORAL_TABLET | ORAL | 4 refills | Status: DC

## 2017-06-21 NOTE — Progress Notes (Signed)
Safety precautions to be maintained throughout the outpatient stay will include: orient to surroundings, keep bed in low position, maintain call bell within reach at all times, provide assistance with transfer out of bed and ambulation.  

## 2017-06-21 NOTE — Progress Notes (Signed)
Patient's Name: [name] Day.  MRN: [id number]  Referring Provider: Volney American,*  DOB: 1961-01-24  PCP: Volney American, PA-C  DOS: 06/21/2017  Note by: Gillis Santa, MD  Service setting: Ambulatory outpatient  Specialty: Interventional Pain Management  Patient type: Established  Location: [facility] (AMB) Pain Management Facility  Visit type: Interventional Procedure   Primary Reason for Visit: Interventional Pain Management Treatment. CC: Back Pain (lower); Hip Pain (left); Shoulder Pain (left); and Neck Pain  Procedure:  Anesthesia, Analgesia, Anxiolysis:  Type: Therapeutic Sacroiliac Joint Steroid Injection          Region: Inferior Lumbosacral Region Level: PIIS (Posterior Inferior Iliac Spine) Laterality: Left-Sided  Type: Local Anesthesia Local Anesthetic: Lidocaine 1% Route: Infiltration ([place]/IM) IV Access: Declined Sedation: Meaningful verbal contact was maintained at all times during the procedure  Indication(s): Analgesia and Anxiety   Indications: 1. SI joint arthritis ([place])   2. Lumbar spondylosis    Pain Score: Pre-procedure: 6 /10 Post-procedure: 0-No pain(numb)/10  Pre-op Assessment:  [name] Day is a 57 y.o. (year old), male patient, seen today for interventional treatment. He  has a past surgical history that includes Leg amputation; Hand surgery; and Leg Surgery. [name] Day has a current medication list which includes the following prescription(s): albuterol, gabapentin, oxycodone hcl, and tizanidine, and the following Facility-Administered Medications: fentanyl and lactated ringers. His primarily concern today is the Back Pain (lower); Hip Pain (left); Shoulder Pain (left); and Neck Pain  Initial Vital Signs:  Pulse Rate: (!) 103 Temp: 97.7 F (36.5 C) ECG Heart Rate:   Resp: 16 BP: (!) 153/84 SpO2: 96 % ETCO2:    BMI: Estimated body mass index is 42.86 kg/m as calculated from the following:   Height as of this encounter: 6'  (1.829 m).   Weight as of this encounter: 316 lb (143.3 kg).  Risk Assessment: Allergies: Reviewed. He is allergic to tramadol.  Allergy Precautions: None required Coagulopathies: Reviewed. None identified.  Blood-thinner therapy: None at this time Active Infection(s): Reviewed. None identified. [name] Day is afebrile  Site Confirmation: [name] Day was asked to confirm the procedure and laterality before marking the site Procedure checklist: Completed Consent: Before the procedure and under the influence of no sedative(s), amnesic(s), or anxiolytics, the patient was informed of the treatment options, risks and possible complications. To fulfill our ethical and legal obligations, as recommended by the American Medical Association's Code of Ethics, I have informed the patient of my clinical impression; the nature and purpose of the treatment or procedure; the risks, benefits, and possible complications of the intervention; the alternatives, including doing nothing; the risk(s) and benefit(s) of the alternative treatment(s) or procedure(s); and the risk(s) and benefit(s) of doing nothing. The patient was provided information about the general risks and possible complications associated with the procedure. These may include, but are not limited to: failure to achieve desired goals, infection, bleeding, organ or nerve damage, allergic reactions, paralysis, and death. In addition, the patient was informed of those risks and complications associated to the procedure, such as failure to decrease pain; infection; bleeding; organ or nerve damage with subsequent damage to sensory, motor, and/or autonomic systems, resulting in permanent pain, numbness, and/or weakness of one or several areas of the body; allergic reactions; (i.e.: anaphylactic reaction); and/or death. Furthermore, the patient was informed of those risks and complications associated with the medications. These include, but are not limited  to: allergic reactions (i.e.: anaphylactic or anaphylactoid reaction(s)); adrenal axis suppression; blood sugar elevation that in  diabetics may result in ketoacidosis or comma; water retention that in patients with history of congestive heart failure may result in shortness of breath, pulmonary edema, and decompensation with resultant heart failure; weight gain; swelling or edema; medication-induced neural toxicity; particulate matter embolism and blood vessel occlusion with resultant organ, and/or nervous system infarction; and/or aseptic necrosis of one or more joints. Finally, the patient was informed that Medicine is not an exact science; therefore, there is also the possibility of unforeseen or unpredictable risks and/or possible complications that may result in a catastrophic outcome. The patient indicated having understood very clearly. We have given the patient no guarantees and we have made no promises. Enough time was given to the patient to ask questions, all of which were answered to the patient's satisfaction. [name] Day has indicated that he wanted to continue with the procedure. Attestation: I, the ordering provider, attest that I have discussed with the patient the benefits, risks, side-effects, alternatives, likelihood of achieving goals, and potential problems during recovery for the procedure that I have provided informed consent. Date: 06/21/2017; Time: 10:39 AM  Pre-Procedure Preparation:  Monitoring: As per clinic protocol. Respiration, ETCO2, SpO2, BP, heart rate and rhythm monitor placed and checked for adequate function Safety Precautions: Patient was assessed for positional comfort and pressure points before starting the procedure. Time-out: I initiated and conducted the "Time-out" before starting the procedure, as per protocol. The patient was asked to participate by confirming the accuracy of the "Time Out" information. Verification of the correct person, site, and procedure  were performed and confirmed by me, the nursing staff, and the patient. "Time-out" conducted as per Joint Commission's Universal Protocol (UP.01.01.01). "Time-out" Date & Time: 06/21/2017; 0952 hrs.  Description of Procedure Process:  Position: Prone Target Area: Inferior, posterior, aspect of the sacroiliac fissure Approach: Posterior, paraspinal, ipsilateral approach. Area Prepped: Entire Lower Lumbosacral Region Prepping solution: ChloraPrep (2% chlorhexidine gluconate and 70% isopropyl alcohol) Safety Precautions: Aspiration looking for blood return was conducted prior to all injections. At no point did we inject any substances, as a needle was being advanced. No attempts were made at seeking any paresthesias. Safe injection practices and needle disposal techniques used. Medications properly checked for expiration dates. SDV (single dose vial) medications used. Description of the Procedure: Protocol guidelines were followed. The patient was placed in position over the procedure table. The target area was identified and the area prepped in the usual manner. Skin & deeper tissues infiltrated with local anesthetic. Appropriate amount of time allowed to pass for local anesthetics to take effect. The procedure needle was advanced under fluoroscopic guidance into the sacroiliac joint until a firm endpoint was obtained. Proper needle placement secured. Negative aspiration confirmed. Solution injected in intermittent fashion, asking for systemic symptoms every 0.5cc of injectate. The needles were then removed and the area cleansed, making sure to leave some of the prepping solution back to take advantage of its long term bactericidal properties. Vitals:   06/21/17 0928 06/21/17 0950 06/21/17 1000  BP: (!) 153/84 (!) 151/85 (!) 154/82  Pulse: (!) 103 100 100  Resp: 16 14 16   Temp: 97.7 F (36.5 C)    TempSrc: Oral    SpO2: 96% 95% 96%  Weight: (!) 316 lb (143.3 kg)    Height: 6' (1.829 m)      Start  Time: 0952 hrs. End Time: 0955 hrs. Materials:  Needle(s) Type: Regular needle Gauge: 22G Length: 3.5-in Medication(s): We administered lidocaine, ropivacaine (PF) 2 mg/mL (0.2%), and dexamethasone. Please see  chart orders for dosing details. 6 cc solution made of 5cc of 0.2% ropivacaine, 1 cc of Decadron 10 mg/cc.  3 cc injected intra-articular, 3cc injected periarticular. Imaging Guidance (Non-Spinal):  Type of Imaging Technique: Fluoroscopy Guidance (Non-Spinal) Indication(s): Assistance in needle guidance and placement for procedures requiring needle placement in or near specific anatomical locations not easily accessible without such assistance. Exposure Time: Please see nurses notes. Contrast: Before injecting any contrast, we confirmed that the patient did not have an allergy to iodine, shellfish, or radiological contrast. Once satisfactory needle placement was completed at the desired level, radiological contrast was injected. Contrast injected under live fluoroscopy. No contrast complications. See chart for type and volume of contrast used. Fluoroscopic Guidance: I was personally present during the use of fluoroscopy. "Tunnel Vision Technique" used to obtain the best possible view of the target area. Parallax error corrected before commencing the procedure. "Direction-depth-direction" technique used to introduce the needle under continuous pulsed fluoroscopy. Once target was reached, antero-posterior, oblique, and lateral fluoroscopic projection used confirm needle placement in all planes. Images permanently stored in EMR. Interpretation: I personally interpreted the imaging intraoperatively. Adequate needle placement confirmed in multiple planes. Appropriate spread of contrast into desired area was observed. No evidence of afferent or efferent intravascular uptake. Permanent images saved into the patient's record.  Antibiotic Prophylaxis:  Indication(s): None identified Antibiotic given:  None  Post-operative Assessment:  Post-procedure Vital Signs:  Pulse Rate: 100 Temp: 97.7 F (36.5 C) ECG Heart Rate:   Resp: 16 BP: (!) 154/82 SpO2: 96 % ETCO2:   EBL: None Complications: No immediate post-treatment complications observed by team, or reported by patient. Note: The patient tolerated the entire procedure well. A repeat set of vitals were taken after the procedure and the patient was kept under observation following institutional policy, for this type of procedure. Post-procedural neurological assessment was performed, showing return to baseline, prior to discharge. The patient was provided with post-procedure discharge instructions, including a section on how to identify potential problems. Should any problems arise concerning this procedure, the patient was given instructions to immediately contact us, at any time, without hesitation. In any case, we plan to contact the patient by telephone for a follow-up status report regarding this interventional procedure. Comments:  No additional relevant information.    Plan of Care    Imaging Orders     DG C-Arm 1-60 Min-No Report Procedure Orders    No procedure(s) ordered today    Medications ordered for procedure: Meds ordered this encounter  Medications  . lactated ringers infusion 1,000 mL  . fentaNYL (SUBLIMAZE) injection 25-100 mcg    Make sure Narcan is available in the pyxis when using this medication. In the event of respiratory depression (RR< 8/min): Titrate NARCAN (naloxone) in increments of 0.1 to 0.2 mg IV at 2-3 minute intervals, until desired degree of reversal.  . lidocaine (XYLOCAINE) 1 % (with pres) injection 10 mL  . ropivacaine (PF) 2 mg/mL (0.2%) (NAROPIN) injection 10 mL  . dexamethasone (DECADRON) injection 10 mg  . gabapentin (NEURONTIN) 600 MG tablet    Sig: 1200 mg  BID    Dispense:  120 tablet    Refill:  4    Do not place this medication, or any other prescription from our practice, on  "Automatic Refill". Patient may have prescription filled one day early if pharmacy is closed on scheduled refill date.   Medications administered: We administered lidocaine, ropivacaine (PF) 2 mg/mL (0.2%), and dexamethasone.  See the medical record  for exact dosing, route, and time of administration.  New Prescriptions   No medications on file   Disposition: Discharge home  Discharge Date & Time: 06/21/2017; 0958 hrs.   Physician-requested Follow-up: Return in about 2 weeks (around 07/05/2017) for Post Procedure Evaluation.  Future Appointments  Date Time Provider [place]  07/04/2017  9:45 AM Gillis Santa, MD [facility] None  09/05/2017  8:45 AM Gillis Santa, MD [facility] None  12/05/2017  8:30 AM Volney American, PA-C [facility]   Primary Care Physician: Volney American, PA-C Location: [facility] Outpatient Pain Management Facility Note by: Gillis Santa, MD Date: 06/21/2017; Time: 10:22 AM  Disclaimer:  Medicine is not an exact science. The only guarantee in medicine is that nothing is guaranteed. It is important to note that the decision to proceed with this intervention was based on the information collected from the patient. The Data and conclusions were drawn from the patient's questionnaire, the interview, and the physical examination. Because the information was provided in large part by the patient, it cannot be guaranteed that it has not been purposely or unconsciously manipulated. Every effort has been made to obtain as much relevant data as possible for this evaluation. It is important to note that the conclusions that lead to this procedure are derived in large part from the available data. Always take into account that the treatment will also be dependent on availability of resources and existing treatment guidelines, considered by other Pain Management Practitioners as being common knowledge and practice, at the time of the intervention. For Medico-Legal  purposes, it is also important to point out that variation in procedural techniques and pharmacological choices are the acceptable norm. The indications, contraindications, technique, and results of the above procedure should only be interpreted and judged by a Board-Certified Interventional Pain Specialist with extensive familiarity and expertise in the same exact procedure and technique.

## 2017-06-22 ENCOUNTER — Telehealth: Payer: Self-pay | Admitting: *Deleted

## 2017-06-22 NOTE — Telephone Encounter (Signed)
Left message with family member who said he was not home.

## 2017-07-04 ENCOUNTER — Ambulatory Visit: Payer: Medicaid Other | Admitting: Student in an Organized Health Care Education/Training Program

## 2017-08-07 ENCOUNTER — Emergency Department: Payer: Medicaid Other

## 2017-08-07 ENCOUNTER — Emergency Department
Admission: EM | Admit: 2017-08-07 | Discharge: 2017-08-07 | Disposition: A | Discharge status: Home / Self Care | Payer: Medicaid Other | Attending: Emergency Medicine | Admitting: Emergency Medicine

## 2017-08-07 ENCOUNTER — Encounter: Payer: Self-pay | Admitting: Emergency Medicine

## 2017-08-07 ENCOUNTER — Other Ambulatory Visit: Payer: Self-pay

## 2017-08-07 DIAGNOSIS — F17210 Nicotine dependence, cigarettes, uncomplicated: Secondary | ICD-10-CM | POA: Insufficient documentation

## 2017-08-07 DIAGNOSIS — R109 Unspecified abdominal pain: Secondary | ICD-10-CM | POA: Diagnosis not present

## 2017-08-07 DIAGNOSIS — Z79899 Other long term (current) drug therapy: Secondary | ICD-10-CM | POA: Diagnosis not present

## 2017-08-07 DIAGNOSIS — R110 Nausea: Secondary | ICD-10-CM | POA: Diagnosis not present

## 2017-08-07 DIAGNOSIS — K2970 Gastritis, unspecified, without bleeding: Secondary | ICD-10-CM

## 2017-08-07 DIAGNOSIS — R197 Diarrhea, unspecified: Secondary | ICD-10-CM | POA: Insufficient documentation

## 2017-08-07 HISTORY — DX: Acquired absence of unspecified leg below knee: Z89.519

## 2017-08-07 LAB — COMPREHENSIVE METABOLIC PANEL
ALK PHOS: 74 U/L (ref 38–126)
ALT: 44 U/L (ref 17–63)
ANION GAP: 12 (ref 5–15)
AST: 45 U/L — ABNORMAL HIGH (ref 15–41)
Albumin: 4.7 g/dL (ref 3.5–5.0)
BUN: 22 mg/dL — ABNORMAL HIGH (ref 6–20)
CALCIUM: 9.8 mg/dL (ref 8.9–10.3)
CO2: 25 mmol/L (ref 22–32)
CREATININE: 0.81 mg/dL (ref 0.61–1.24)
Chloride: 98 mmol/L — ABNORMAL LOW (ref 101–111)
GFR calc non Af Amer: >60 mL/min (ref >60)
Glucose, Bld: 190 mg/dL — ABNORMAL HIGH (ref 65–99)
Potassium: 4 mmol/L (ref 3.5–5.1)
SODIUM: 135 mmol/L (ref 135–145)
Total Bilirubin: 0.5 mg/dL (ref 0.3–1.2)
Total Protein: 8.2 g/dL — ABNORMAL HIGH (ref 6.5–8.1)

## 2017-08-07 LAB — URINALYSIS, COMPLETE (UACMP) WITH MICROSCOPIC
BACTERIA UA: NONE SEEN
Bilirubin Urine: NEGATIVE
Glucose, UA: NEGATIVE mg/dL
Hgb urine dipstick: NEGATIVE
KETONES UR: NEGATIVE mg/dL
Nitrite: NEGATIVE
PH: 6 (ref 5.0–8.0)
Protein, ur: NEGATIVE mg/dL
Specific Gravity, Urine: 1.038 — ABNORMAL HIGH (ref 1.005–1.030)

## 2017-08-07 LAB — CBC
HEMATOCRIT: 47.8 % (ref 40.0–52.0)
Hemoglobin: 16.5 g/dL (ref 13.0–18.0)
MCH: 30.5 pg (ref 26.0–34.0)
MCHC: 34.6 g/dL (ref 32.0–36.0)
MCV: 88.3 fL (ref 80.0–100.0)
Platelets: 254 10*3/uL (ref 150–440)
RBC: 5.41 MIL/uL (ref 4.40–5.90)
RDW: 14.7 % — AB (ref 11.5–14.5)
WBC: 22.5 10*3/uL — ABNORMAL HIGH (ref 3.8–10.6)

## 2017-08-07 LAB — LIPASE, BLOOD: LIPASE: 27 U/L (ref 11–51)

## 2017-08-07 MED ORDER — IOPAMIDOL (ISOVUE-370) INJECTION 76%
100.0000 mL | Freq: Once | INTRAVENOUS | Status: AC | PRN
  Administered 2017-08-07: 100 mL via INTRAVENOUS
  Filled 2017-08-07: qty 100

## 2017-08-07 MED ORDER — GI COCKTAIL ~~LOC~~
30.0000 mL | Freq: Once | ORAL | Status: AC
  Administered 2017-08-07: 30 mL via ORAL
  Filled 2017-08-07: qty 30

## 2017-08-07 MED ORDER — FAMOTIDINE 40 MG PO TABS: 40.0000 mg | ORAL_TABLET | Freq: Every evening | ORAL | 1 refills | Status: DC

## 2017-08-07 MED ORDER — DICYCLOMINE HCL 20 MG PO TABS: 20.0000 mg | ORAL_TABLET | Freq: Three times a day (TID) | ORAL | 0 refills | Status: DC | PRN

## 2017-08-07 MED ORDER — IOPAMIDOL (ISOVUE-300) INJECTION 61%
30.0000 mL | Freq: Once | INTRAVENOUS | Status: AC | PRN
  Administered 2017-08-07: 30 mL via ORAL
  Filled 2017-08-07: qty 30

## 2017-08-07 MED ORDER — SUCRALFATE 1 G PO TABS
1.0000 g | ORAL_TABLET | Freq: Four times a day (QID) | ORAL | 0 refills | Status: DC
Start: 2017-08-07 — End: 2017-09-11

## 2017-08-07 NOTE — ED Notes (Signed)
Pt drinking contrast for CT. 

## 2017-08-07 NOTE — ED Notes (Signed)
Pt in with co abd pain above the umbilicus and LUQ hx of umbilical hernia that had surgeon evaluate a few years ago. Was told no surgery was indicated unless pain worsened. Pt states pain started 1 week ago with diarrhea a few days ago but none today. Pt has had nausea but no vomiting.

## 2017-08-07 NOTE — ED Notes (Signed)
Pt done with contrast awaiting ct

## 2017-08-07 NOTE — Discharge Instructions (Addendum)
Please seek medical attention for any high fevers, chest pain, shortness of breath, change in behavior, persistent vomiting, bloody stool or any other new or concerning symptoms.  

## 2017-08-07 NOTE — ED Notes (Signed)
Pt to CT

## 2017-08-07 NOTE — ED Provider Notes (Signed)
[facility] Emergency Department Provider Note  ____________________________________________   I have reviewed the triage vital signs and the nursing notes.   HISTORY  Chief Complaint Abdominal Pain   History limited by: Not Limited   HPI [name] Day. is a 57 y.o. male who presents to the emergency department today because of concern for abdominal pain. Patient states that for the past few days he has been having abdominal discomfort with some nausea and diarrhea. Today however as he was picking up a piece of plywood the discomfort became severe. It is located primarily in the middle of his abdomen. States he has a history of an abdominal wall hernia. He has not had any fevers. Did not some blood in his stool a couple of days ago but more recently it has been black.    Per medical record review patient has a history of depression.  Past Medical History:  Diagnosis Date  . Depression   . Hx of BKA ([place])    due to complicated fracture   . Panic attack     Patient Active Problem List   Diagnosis Date Noted  . S/P BKA (below knee amputation) unilateral, left ([place]) 03/29/2017  . Chronic pain syndrome 03/29/2017  . 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 Depression 03/04/2017  . Chronic left hip pain 03/04/2017    Past Surgical History:  Procedure Laterality Date  . HAND SURGERY    . LEG AMPUTATION    . LEG SURGERY      Prior to Admission medications   Medication Sig Start Date End Date Taking? Authorizing Provider  albuterol (PROVENTIL HFA;VENTOLIN HFA) 108 (90 Base) MCG/ACT inhaler Inhale 2 puffs into the lungs every 6 (six) hours as needed for wheezing or shortness of breath. 01/01/17   Merlyn Lot, MD  gabapentin (NEURONTIN) 600 MG tablet 1200 mg  BID 06/21/17   Gillis Santa, MD  Oxycodone HCl 10 MG TABS Take 1 tablet (10 mg total) by mouth every 6 (six) hours as needed. For chronic pain, to last for  30 days from fill date  To fill on or after: 06/17/2017, 07/17/2017, 08/15/2017 06/08/17   Gillis Santa, MD  tiZANidine (ZANAFLEX) 4 MG tablet Take 1 tablet (4 mg total) by mouth every 8 (eight) hours as needed for muscle spasms. 04/18/17   Gillis Santa, MD    Allergies Tramadol  Family History  Problem Relation Age of Onset  . Cancer Mother        Mastatic  . Diabetes Father   . Hypertension Father   . Heart disease Father   . Alzheimer's disease Maternal Grandmother   . Cancer Maternal Grandfather   . Cancer Paternal Grandmother     Social History Social History   Tobacco Use  . Smoking status: Current Every Day Smoker    Packs/day: 0.50    Types: Cigarettes  . Smokeless tobacco: Former Network engineer Use Topics  . Alcohol use: No  . Drug use: No    Review of Systems Constitutional: No fever/chills Eyes: No visual changes. ENT: No sore throat. Cardiovascular: Denies chest pain. Respiratory: Denies shortness of breath. Gastrointestinal: Positive for abdominal pain. Nausea.  Genitourinary: Negative for dysuria. Musculoskeletal: Negative for back pain. Skin: Negative for rash. Neurological: Negative for headaches, focal weakness or numbness.  ____________________________________________   PHYSICAL EXAM:  VITAL SIGNS: ED Triage Vitals  Enc Vitals Group     BP --      Pulse  Rate 08/07/17 1952 (!) 125     Resp 08/07/17 1952 20     Temp 08/07/17 1952 97.8 F (36.6 C)     Temp Source 08/07/17 1952 Oral     SpO2 08/07/17 1952 99 %     Weight 08/07/17 1951 (!) 320 lb (145.2 kg)     Height 08/07/17 1951 6' (1.829 m)     Head Circumference --      Peak Flow --      Pain Score 08/07/17 1951 6    Constitutional: Alert and oriented. Well appearing and in no distress. Eyes: Conjunctivae are normal.  ENT   Head: Normocephalic and atraumatic.   Nose: No congestion/rhinnorhea.   Mouth/Throat: Mucous membranes are moist.   Neck: No  stridor. Hematological/Lymphatic/Immunilogical: No cervical lymphadenopathy. Cardiovascular: Normal rate, regular rhythm.  No murmurs, rubs, or gallops.  Respiratory: Normal respiratory effort without tachypnea nor retractions. Breath sounds are clear and equal bilaterally. No wheezes/rales/rhonchi. Gastrointestinal: Soft and moderately tender in the central abdomen. No rebound. No guarding.  Genitourinary: Deferred Musculoskeletal: Normal range of motion in all extremities. No lower extremity edema. Neurologic:  Normal speech and language. No gross focal neurologic deficits are appreciated.  Skin:  Skin is warm, dry and intact. No rash noted. Psychiatric: Mood and affect are normal. Speech and behavior are normal. Patient exhibits appropriate insight and judgment.  ____________________________________________    LABS (pertinent positives/negatives)  CBC wbc 22.5, hgb 16.5, plt 254 CMP na 135, k 4.0, glu 190, cr 0.81 Lipase 27  ____________________________________________   EKG  None  ____________________________________________    RADIOLOGY  CT abd.pel No acute intraabdominal pathology, question some esophagitis, fat containing hernia  ____________________________________________   PROCEDURES  Procedures  ____________________________________________   INITIAL IMPRESSION / ASSESSMENT AND PLAN / ED COURSE  Pertinent labs & imaging results that were available during my care of the patient were reviewed by me and considered in my medical decision making (see chart for details).  Patient presented to the emergency department today because of concerns for abdominal pain.  Differential would be broad including pancreatitis gastritis esophagitis duodenitis hepatitis diverticulitis appendicitis panniculitis AAA kidney stone amongst other etiologies.  Patient's blood work did show a leukocytosis.  CT abdomen pelvis was obtained.  This however did not show any concerning  intra-abdominal pathology.  Did question some esophagitis.  Patient was given a GI cocktail and did feel some relief.  Additionally wonder if patient simply has gastroenteritis.  Will plan on discharging with medications for gastritis and gastroenteritis.  Discussed return precautions.   ____________________________________________   FINAL CLINICAL IMPRESSION(S) / ED DIAGNOSES  Final diagnoses:  Abdominal pain, unspecified abdominal location  Gastritis, presence of bleeding unspecified, unspecified chronicity, unspecified gastritis type     Note: This dictation was prepared with Dragon dictation. Any transcriptional errors that result from this process are unintentional     Nance Pear, MD 08/07/17 2225

## 2017-08-07 NOTE — ED Triage Notes (Signed)
Patient ambulatory to triage with steady gait, without difficulty or distress noted; pt reports umbilical pain x week with hx hernia accomp by rectal bleeding

## 2017-08-07 NOTE — ED Notes (Signed)
Patient discharged to home per MD order. Patient in stable condition, and deemed medically cleared by ED provider for discharge. Discharge instructions reviewed with patient/family using "Teach Back"; verbalized understanding of medication education and administration, and information about follow-up care. Denies further concerns. ° °

## 2017-09-05 ENCOUNTER — Encounter: Payer: Medicaid Other | Admitting: Student in an Organized Health Care Education/Training Program

## 2017-09-05 ENCOUNTER — Telehealth: Payer: Self-pay | Admitting: *Deleted

## 2017-09-11 ENCOUNTER — Other Ambulatory Visit: Payer: Self-pay

## 2017-09-11 ENCOUNTER — Ambulatory Visit: Payer: Medicaid Other | Attending: Nurse Practitioner | Admitting: Nurse Practitioner

## 2017-09-11 ENCOUNTER — Encounter: Payer: Self-pay | Admitting: Nurse Practitioner

## 2017-09-11 VITALS — BP 123/98 | HR 103 | Temp 98.2°F | Resp 16 | Ht 72.0 in | Wt 325.0 lb

## 2017-09-11 DIAGNOSIS — Z79891 Long term (current) use of opiate analgesic: Secondary | ICD-10-CM | POA: Diagnosis not present

## 2017-09-11 DIAGNOSIS — M25552 Pain in left hip: Secondary | ICD-10-CM | POA: Insufficient documentation

## 2017-09-11 DIAGNOSIS — M19079 Primary osteoarthritis, unspecified ankle and foot: Secondary | ICD-10-CM | POA: Diagnosis not present

## 2017-09-11 DIAGNOSIS — F419 Anxiety disorder, unspecified: Secondary | ICD-10-CM | POA: Insufficient documentation

## 2017-09-11 DIAGNOSIS — Z5181 Encounter for therapeutic drug level monitoring: Secondary | ICD-10-CM | POA: Insufficient documentation

## 2017-09-11 DIAGNOSIS — M79605 Pain in left leg: Secondary | ICD-10-CM | POA: Insufficient documentation

## 2017-09-11 DIAGNOSIS — G8929 Other chronic pain: Secondary | ICD-10-CM | POA: Diagnosis not present

## 2017-09-11 DIAGNOSIS — G894 Chronic pain syndrome: Secondary | ICD-10-CM | POA: Insufficient documentation

## 2017-09-11 DIAGNOSIS — M25551 Pain in right hip: Secondary | ICD-10-CM | POA: Insufficient documentation

## 2017-09-11 DIAGNOSIS — Z6841 Body Mass Index (BMI) 40.0 and over, adult: Secondary | ICD-10-CM | POA: Insufficient documentation

## 2017-09-11 DIAGNOSIS — M5442 Lumbago with sciatica, left side: Secondary | ICD-10-CM | POA: Diagnosis not present

## 2017-09-11 DIAGNOSIS — M47816 Spondylosis without myelopathy or radiculopathy, lumbar region: Secondary | ICD-10-CM | POA: Diagnosis not present

## 2017-09-11 DIAGNOSIS — Z79899 Other long term (current) drug therapy: Secondary | ICD-10-CM | POA: Diagnosis not present

## 2017-09-11 DIAGNOSIS — F329 Major depressive disorder, single episode, unspecified: Secondary | ICD-10-CM | POA: Insufficient documentation

## 2017-09-11 DIAGNOSIS — Z720 Tobacco use: Secondary | ICD-10-CM | POA: Insufficient documentation

## 2017-09-11 DIAGNOSIS — E669 Obesity, unspecified: Secondary | ICD-10-CM | POA: Insufficient documentation

## 2017-09-11 DIAGNOSIS — F17219 Nicotine dependence, cigarettes, with unspecified nicotine-induced disorders: Secondary | ICD-10-CM | POA: Insufficient documentation

## 2017-09-11 DIAGNOSIS — M533 Sacrococcygeal disorders, not elsewhere classified: Secondary | ICD-10-CM

## 2017-09-11 DIAGNOSIS — F17210 Nicotine dependence, cigarettes, uncomplicated: Secondary | ICD-10-CM | POA: Diagnosis not present

## 2017-09-11 MED ORDER — GABAPENTIN 600 MG PO TABS: ORAL_TABLET | ORAL | 2 refills | Status: DC

## 2017-09-11 MED ORDER — OXYCODONE HCL 10 MG PO TABS: 10.0000 mg | ORAL_TABLET | Freq: Four times a day (QID) | ORAL | 0 refills | Status: DC | PRN

## 2017-09-11 MED ORDER — TIZANIDINE HCL 4 MG PO TABS: 4.0000 mg | ORAL_TABLET | Freq: Three times a day (TID) | ORAL | 2 refills | Status: AC | PRN

## 2017-09-11 NOTE — Progress Notes (Signed)
Patient's Name: [name] Day.  MRN: [id number]  Referring Provider: Volney American,*  DOB: December 19, 1960  PCP: Volney American, PA-C  DOS: 09/11/2017  Note by: Vevelyn Francois NP  Service setting: Ambulatory outpatient  Specialty: Interventional Pain Management  Location: [facility] (AMB) Pain Management Facility    Patient type: Established    Primary Reason(s) for Visit: Encounter for prescription drug management & post-procedure evaluation of chronic illness with mild to moderate exacerbation(Level of risk: moderate) CC: Leg Pain (left nub); Back Pain (lower); and Hip Pain (bilaterally)  HPI  [name] Day is a 57 y.o. year old, male patient, who comes today for a post-procedure evaluation and medication management. He has Depression; Chronic left hip pain; S/P BKA (below knee amputation) unilateral, left ([place]); Chronic pain syndrome; 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[place]); Anxiety; Elevated blood-pressure reading without diagnosis of hypertension; Pain in joint involving ankle and foot; Late complications of amputation stump ([place]); Nonunion of fracture; Obesity; Phantom limb pain ([place]); Primary localized osteoarthrosis of ankle and foot; Tobacco abuse; Traumatic arthropathy of ankle and foot; Lumbar spondylosis; Chronic sacroiliac joint pain; and Chronic bilateral low back pain with left-sided sciatica on their problem list. His primarily concern today is the Leg Pain (left nub); Back Pain (lower); and Hip Pain (bilaterally)  Pain Assessment: Location: Left Leg(left nub) Radiating: denies Onset: More than a month ago Duration: Chronic pain Quality: Throbbing, Constant Severity: 5 /10 (subjective, self-reported pain score)  Note: Reported level is compatible with observation.                          Effect on ADL: must take frequent breaks to elevate left leg Timing: Constant Modifying factors: injections, medications BP: (!) 123/98  HR: (!) 103  Mr.  Day was last seen on 09/05/2017 for a procedure. During today's appointment we reviewed [name] Day's post-procedure results, as well as his outpatient medication regimen. He states that he has leg pain . He states that he is not sure how to describe it. He states that he needs additional surgery however he does not want to do this because of the complications. He admits that he does not just have phantom pain that his pain is real. He states they sowed muscle also.   He states that he injection was effective. He feels like he got 100 for 2 weeks and then it gradually fall off.   Further details on both, my assessment(s), as well as the proposed treatment plan, please see below.  Controlled Substance Pharmacotherapy Assessment REMS (Risk Evaluation and Mitigation Strategy)  Analgesic: Oxycodone 10 mg QID prn  MME/day: 76 MME    [name], RN  09/11/2017  9:01 AM  Sign at close encounter Safety precautions to be maintained throughout the outpatient stay will include: orient to surroundings, keep bed in low position, maintain call bell within reach at all times, provide assistance with transfer out of bed and ambulation.   Pt brought bottle of pink and white pills. Label on bottle was too worn to be able to read information.    Pharmacokinetics: Liberation and absorption (onset of action): WNL Distribution (time to peak effect): WNL Metabolism and excretion (duration of action): WNL         Pharmacodynamics: Desired effects: Analgesia: [name] Day reports >50% benefit. Functional ability: Patient reports that medication allows him to accomplish basic ADLs Clinically meaningful improvement in function (CMIF): Sustained CMIF goals met Perceived effectiveness: Described  as relatively effective, allowing for increase in activities of daily living (ADL) Undesirable effects: Side-effects or Adverse reactions: None reported Monitoring: [employer] PMP: Online review of the past 34-monthperiod  conducted. Compliant with practice rules and regulations Last UDS on record: Summary  Date Value Ref Range Status  03/29/2017 FINAL  Final    Comment:    ==================================================================== [facility] COMP DRUG ANALYSIS,UR ==================================================================== Test                             Result       Flag       Units Drug Present not Declared for Prescription Verification   Acetaminophen                  PRESENT      UNEXPECTED Drug Absent but Declared for Prescription Verification   Oxycodone                      Not Detected UNEXPECTED ng/mg creat   Gabapentin                     Not Detected UNEXPECTED   Tizanidine                     Not Detected UNEXPECTED    Tizanidine, as indicated in the declared medication list, is not    always detected even when used as directed.   Amitriptyline                  Not Detected UNEXPECTED ==================================================================== Test                      Result    Flag   Units      Ref Range   Creatinine              131              mg/dL      >=20 ==================================================================== Declared Medications:  The flagging and interpretation on this report are based on the  following declared medications.  Unexpected results may arise from  inaccuracies in the declared medications.  **Note: The testing scope of this panel includes these medications:  Amitriptyline  Gabapentin  Oxycodone  **Note: The testing scope of this panel does not include small to  moderate amounts of these reported medications:  Tizanidine  **Note: The testing scope of this panel does not include following  reported medications:  Albuterol  Betamethasone  Meloxicam  Prednisone ==================================================================== For clinical consultation, please call [phone]  [phone] ====================================================================    UDS interpretation: Compliant          Medication Assessment Form: Reviewed. Patient indicates being compliant with therapy Treatment compliance: Compliant Risk Assessment Profile: Aberrant behavior: See prior evaluations. None observed or detected today Comorbid factors increasing risk of overdose: See prior notes. No additional risks detected today Risk of substance use disorder (SUD): Low Opioid Risk Tool - 09/11/17 0858      Family History of Substance Abuse   Alcohol  Negative    Illegal Drugs  Negative    Rx Drugs  Negative      Personal History of Substance Abuse   Alcohol  Negative    Illegal Drugs  Negative    Rx Drugs  Negative      Age   Age between 129-45years   No  History of Preadolescent Sexual Abuse   History of Preadolescent Sexual Abuse  Negative or Male      Psychological Disease   Psychological Disease  Negative    Depression  Negative      Total Score   Opioid Risk Tool Scoring  0    Opioid Risk Interpretation  Low Risk      ORT Scoring interpretation table:  Score <3 = Low Risk for SUD  Score between 4-7 = Moderate Risk for SUD  Score >8 = High Risk for Opioid Abuse   Risk Mitigation Strategies:  Patient Counseling: Covered Patient-Prescriber Agreement (PPA): Present and active  Notification to other healthcare providers: Done  Pharmacologic Plan: No change in therapy, at this time.             Post-Procedure Assessment  06/21/17 Procedure: Left SI joint NB Pre-procedure pain score:  6/10 Post-procedure pain score: 0/10         Influential Factors: BMI: 44.08 kg/m Intra-procedural challenges: None observed.         Assessment challenges: None detected.              Reported side-effects: None.        Post-procedural adverse reactions or complications: None reported         Sedation: Please see nurses note. When no sedatives are used, the analgesic  levels obtained are directly associated to the effectiveness of the local anesthetics. However, when sedation is provided, the level of analgesia obtained during the initial 1 hour following the intervention, is believed to be the result of a combination of factors. These factors may include, but are not limited to: 1. The effectiveness of the local anesthetics used. 2. The effects of the analgesic(s) and/or anxiolytic(s) used. 3. The degree of discomfort experienced by the patient at the time of the procedure. 4. The patients ability and reliability in recalling and recording the events. 5. The presence and influence of possible secondary gains and/or psychosocial factors. Reported result: Relief experienced during the 1st hour after the procedure: 0 % (Ultra-Short Term Relief)            Interpretative annotation: Clinically appropriate result. Analgesia during this period is likely to be Local Anesthetic and/or IV Sedative (Analgesic/Anxiolytic) related.          Effects of local anesthetic: The analgesic effects attained during this period are directly associated to the localized infiltration of local anesthetics and therefore cary significant diagnostic value as to the etiological location, or anatomical origin, of the pain. Expected duration of relief is directly dependent on the pharmacodynamics of the local anesthetic used. Long-acting (4-6 hours) anesthetics used.  Reported result: Relief during the next 4 to 6 hour after the procedure: 100 % (Short-Term Relief)            Interpretative annotation: Clinically appropriate result. Analgesia during this period is likely to be Local Anesthetic-related.          Long-term benefit: Defined as the period of time past the expected duration of local anesthetics (1 hour for short-acting and 4-6 hours for long-acting). With the possible exception of prolonged sympathetic blockade from the local anesthetics, benefits during this period are typically  attributed to, or associated with, other factors such as analgesic sensory neuropraxia, antiinflammatory effects, or beneficial biochemical changes provided by agents other than the local anesthetics.  Reported result: Extended relief following procedure: 100 %(lasted for "about a month") (Long-Term Relief)  Interpretative annotation: Clinically appropriate result. Good relief. No permanent benefit expected. Inflammation plays a part in the etiology to the pain.          Current benefits: Defined as reported results that persistent at this point in time.   Analgesia: <50 %            Function: Back to baseline ROM: Back to baseline Interpretative annotation: Recurrence of symptoms. No permanent benefit expected. Effective diagnostic intervention.          Interpretation: Results would suggest a successful diagnostic intervention. for diagnostic reasons          Plan:  Please see "Plan of Care" for details.                Laboratory Chemistry  Inflammation Markers (CRP: Acute Phase) (ESR: Chronic Phase) No results found for: CRP, ESRSEDRATE, LATICACIDVEN                       Rheumatology Markers No results found for: RF, ANA, LABURIC, URICUR, LYMEIGGIGMAB, LYMEABIGMQN, HLAB27                      Renal Function Markers Lab Results  Component Value Date   BUN 22 (H) 08/07/2017   CREATININE 0.81 08/07/2017   GFRAA >60 08/07/2017   GFRNONAA >60 08/07/2017                              Hepatic Function Markers Lab Results  Component Value Date   AST 45 (H) 08/07/2017   ALT 44 08/07/2017   ALBUMIN 4.7 08/07/2017   ALKPHOS 74 08/07/2017   LIPASE 27 08/07/2017                        Electrolytes Lab Results  Component Value Date   NA 135 08/07/2017   K 4.0 08/07/2017   CL 98 (L) 08/07/2017   CALCIUM 9.8 08/07/2017                        Neuropathy Markers No results found for: VITAMINB12, FOLATE, HGBA1C, HIV                      Bone Pathology Markers No  results found for: VD25OH, VD125OH2TOT, [id number], [id number], 25OHVITD1, 25OHVITD2, 25OHVITD3, TESTOFREE, TESTOSTERONE                       Coagulation Parameters Lab Results  Component Value Date   PLT 254 08/07/2017                        Cardiovascular Markers Lab Results  Component Value Date   CKTOTAL 135 04/07/2012   CKMB 0.7 04/07/2012   TROPONINI <0.03 01/01/2017   HGB 16.5 08/07/2017   HCT 47.8 08/07/2017                         CA Markers No results found for: CEA, CA125, LABCA2                      Note: Lab results reviewed.  Recent Diagnostic Imaging Results  CT ABDOMEN PELVIS W CONTRAST CLINICAL DATA:  Pain above the umbilicus  EXAM: CT ABDOMEN AND PELVIS WITH CONTRAST  TECHNIQUE: Multidetector  CT imaging of the abdomen and pelvis was performed using the standard protocol following bolus administration of intravenous contrast.  CONTRAST:  127m ISOVUE-370 IOPAMIDOL (ISOVUE-370) INJECTION 76%  COMPARISON:  None.  FINDINGS: Lower chest: Lung bases demonstrate no acute consolidation or effusion. Heart size within normal limits. Circumferential distal esophageal thickening.  Hepatobiliary: Hepatic steatosis. Possible subtle contour nodularity of the liver. No calcified gallstones or biliary dilatation  Pancreas: Unremarkable. No pancreatic ductal dilatation or surrounding inflammatory changes.  Spleen: Normal in size without focal abnormality.  Adrenals/Urinary Tract: Adrenal glands are unremarkable. Kidneys are normal, without renal calculi, focal lesion, or hydronephrosis. Bladder is unremarkable.  Stomach/Bowel: Stomach is within normal limits. Appendix appears normal. No evidence of bowel wall thickening, distention, or inflammatory changes.  Vascular/Lymphatic: Mild aortic atherosclerosis. No aneurysmal dilatation. No significantly enlarged lymph nodes.  Reproductive: Prostate is unremarkable.  Other: Negative for free air or free  fluid. Small to moderate fat containing supraumbilical hernia without stranding or fluid to suggest incarcerated fat.  Musculoskeletal: Degenerative changes. No acute or suspicious abnormality.  IMPRESSION: 1. No CT evidence for acute intra-abdominal or pelvic abnormality 2. Mild circumferential wall thickening of the distal esophagus, possible esophagitis or reflux 3. Hepatic steatosis. Possible subtle contour nodularity of the liver as may be seen with early changes of cirrhosis 4. Small fat containing supraumbilical ventral hernia without evidence for incarceration.  Electronically Signed   By: KDonavan FoilM.D.   On: 08/07/2017 21:52  Complexity Note: Imaging results reviewed. Results shared with Mr. SBarro using Layman's terms.                         Meds   Current Outpatient Medications:  .  acetaminophen (TYLENOL) 500 MG tablet, Take 500 mg by mouth every 6 (six) hours as needed., Disp: , Rfl:  .  albuterol (PROVENTIL HFA;VENTOLIN HFA) 108 (90 Base) MCG/ACT inhaler, Inhale 2 puffs into the lungs every 6 (six) hours as needed for wheezing or shortness of breath., Disp: 1 Inhaler, Rfl: 2 .  famotidine (PEPCID) 40 MG tablet, Take 1 tablet (40 mg total) by mouth every evening., Disp: 30 tablet, Rfl: 1 .  [START ON 09/14/2017] gabapentin (NEURONTIN) 600 MG tablet, 1200 mg  BID, Disp: 120 tablet, Rfl: 2 .  naproxen sodium (ALEVE) 220 MG tablet, Take 220 mg by mouth., Disp: , Rfl:  .  [START ON 09/14/2017] Oxycodone HCl 10 MG TABS, Take 1 tablet (10 mg total) by mouth every 6 (six) hours as needed. For chronic pain, to last for 30 days from fill date  To fill on or after: 09/14/2017,, Disp: 120 tablet, Rfl: 0 .  tiZANidine (ZANAFLEX) 4 MG tablet, Take 1 tablet (4 mg total) by mouth every 8 (eight) hours as needed for muscle spasms., Disp: 90 tablet, Rfl: 2 .  [START ON 11/13/2017] Oxycodone HCl 10 MG TABS, Take 1 tablet (10 mg total) by mouth every 6 (six) hours as needed., Disp: 120  tablet, Rfl: 0 .  [START ON 10/14/2017] Oxycodone HCl 10 MG TABS, Take 1 tablet (10 mg total) by mouth every 6 (six) hours as needed., Disp: 120 tablet, Rfl: 0  ROS  Constitutional: Denies any fever or chills Gastrointestinal: No reported hemesis, hematochezia, vomiting, or acute GI distress Musculoskeletal: Denies any acute onset joint swelling, redness, loss of ROM, or weakness Neurological: No reported episodes of acute onset apraxia, aphasia, dysarthria, agnosia, amnesia, paralysis, loss of coordination, or loss of consciousness  Allergies  Mr. Ybanez is allergic to tramadol.  PFSH  Drug: Mr. Jorge  reports that he does not use drugs. Alcohol:  reports that he does not drink alcohol. Tobacco:  reports that he has been smoking cigarettes.  He has been smoking about 0.50 packs per day. He has quit using smokeless tobacco. Medical:  has a past medical history of Depression, BKA ([place]), and Panic attack. Surgical: Mr. Zhen  has a past surgical history that includes Leg amputation; Hand surgery; and Leg Surgery. Family: family history includes Alzheimer's disease in his maternal grandmother; Cancer in his maternal grandfather, mother, and paternal grandmother; Diabetes in his father; Heart disease in his father; Hypertension in his father.  Constitutional Exam  General appearance: Well nourished, well developed, and well hydrated. In no apparent acute distress Vitals:   09/11/17 0847  BP: (!) 123/98  Pulse: (!) 103  Resp: 16  Temp: 98.2 F (36.8 C)  TempSrc: Oral  SpO2: 95%  Weight: (!) 325 lb (147.4 kg)  Height: 6' (1.829 m)   BMI Assessment: Estimated body mass index is 44.08 kg/m as calculated from the following:   Height as of this encounter: 6' (1.829 m).   Weight as of this encounter: 325 lb (147.4 kg).  BMI interpretation table: BMI level Category Range association with higher incidence of chronic pain  <18 kg/m2 Underweight   18.5-24.9 kg/m2 Ideal body weight    25-29.9 kg/m2 Overweight Increased incidence by 20%  30-34.9 kg/m2 Obese (Class I) Increased incidence by 68%  35-39.9 kg/m2 Severe obesity (Class II) Increased incidence by 136%  >40 kg/m2 Extreme obesity (Class III) Increased incidence by 254%   Patient's current BMI Ideal Body weight  Body mass index is 44.08 kg/m. Ideal body weight: 77.6 kg (171 lb 1.2 oz) Adjusted ideal body weight: 105.5 kg (232 lb 10.3 oz)   BMI Readings from Last 4 Encounters:  09/11/17 44.08 kg/m  08/07/17 43.40 kg/m  06/21/17 42.86 kg/m  06/08/17 42.86 kg/m   Wt Readings from Last 4 Encounters:  09/11/17 (!) 325 lb (147.4 kg)  08/07/17 (!) 320 lb (145.2 kg)  06/21/17 (!) 316 lb (143.3 kg)  06/08/17 (!) 316 lb (143.3 kg)  Psych/Mental status: Alert, oriented x 3 (person, place, & time)       Eyes: PERLA Respiratory: No evidence of acute respiratory distress  Cervical Spine Area Exam  Skin & Axial Inspection: No masses, redness, edema, swelling, or associated skin lesions Alignment: Symmetrical Functional ROM: Unrestricted ROM      Stability: No instability detected Muscle Tone/Strength: Functionally intact. No obvious neuro-muscular anomalies detected. Sensory (Neurological): Unimpaired Palpation: No palpable anomalies              Upper Extremity (UE) Exam    Side: Right upper extremity  Side: Left upper extremity  Skin & Extremity Inspection: Skin color, temperature, and hair growth are WNL. No peripheral edema or cyanosis. No masses, redness, swelling, asymmetry, or associated skin lesions. No contractures.  Skin & Extremity Inspection: Skin color, temperature, and hair growth are WNL. No peripheral edema or cyanosis. No masses, redness, swelling, asymmetry, or associated skin lesions. No contractures.  Functional ROM: Unrestricted ROM          Functional ROM: Unrestricted ROM          Muscle Tone/Strength: Functionally intact. No obvious neuro-muscular anomalies detected.  Muscle  Tone/Strength: Functionally intact. No obvious neuro-muscular anomalies detected.  Sensory (Neurological): Unimpaired          Sensory (Neurological):  Unimpaired          Palpation: No palpable anomalies              Palpation: No palpable anomalies              Provocative Test(s):  Phalen's test: deferred Tinel's test: deferred Apley's scratch test (touch opposite shoulder):  Action 1 (Across chest): deferred Action 2 (Overhead): deferred Action 3 (LB reach): deferred   Provocative Test(s):  Phalen's test: deferred Tinel's test: deferred Apley's scratch test (touch opposite shoulder):  Action 1 (Across chest): deferred Action 2 (Overhead): deferred Action 3 (LB reach): deferred    Thoracic Spine Area Exam  Skin & Axial Inspection: No masses, redness, or swelling Alignment: Symmetrical Functional ROM: Unrestricted ROM Stability: No instability detected Muscle Tone/Strength: Functionally intact. No obvious neuro-muscular anomalies detected. Sensory (Neurological): Unimpaired Muscle strength & Tone: No palpable anomalies  Lumbar Spine Area Exam  Skin & Axial Inspection: No masses, redness, or swelling Alignment: Symmetrical Functional ROM: Unrestricted ROM       Stability: No instability detected Muscle Tone/Strength: Functionally intact. No obvious neuro-muscular anomalies detected. Sensory (Neurological): Unimpaired Palpation: Tender       Provocative Tests: Lumbar Hyperextension/rotation test: deferred today       Lumbar quadrant test (Kemp's test): deferred today       Lumbar Lateral bending test: deferred today       Patrick's Maneuver: deferred today                   FABER test: deferred today       Thigh-thrust test: deferred today       S-I compression test: deferred today       S-I distraction test: deferred today        Gait & Posture Assessment  Ambulation: Unassisted Gait: Relatively normal for age and body habitus Posture: WNL   Lower Extremity Exam     Side: Right lower extremity  Side: Left lower extremity  Stability: No instability observed          Stability: No instability observed          Skin & Extremity Inspection: Skin color, temperature, and hair growth are WNL. No peripheral edema or cyanosis. No masses, redness, swelling, asymmetry, or associated skin lesions. No contractures.  Skin & Extremity Inspection: Below knee amputation (BKA)  Functional ROM: Unrestricted ROM                  Functional ROM: Unrestricted ROM                  Muscle Tone/Strength: Functionally intact. No obvious neuro-muscular anomalies detected.  Muscle Tone/Strength: Functionally intact. No obvious neuro-muscular anomalies detected.  Sensory (Neurological): Unimpaired  Sensory (Neurological): Unimpaired  Palpation: No palpable anomalies  Palpation: No palpable anomalies   Assessment  Primary Diagnosis & Pertinent Problem List: The primary encounter diagnosis was Chronic sacroiliac joint pain. Diagnoses of Lumbar spondylosis, Chronic bilateral low back pain with left-sided sciatica, and Long term prescription opiate use were also pertinent to this visit.  Status Diagnosis  Worsening Worsening Persistent 1. Chronic sacroiliac joint pain   2. Lumbar spondylosis   3. Chronic bilateral low back pain with left-sided sciatica   4. Long term prescription opiate use     Problems updated and reviewed during this visit: Problem  Lumbar Spondylosis  Chronic Sacroiliac Joint Pain  Chronic Bilateral Low Back Pain With Left-Sided Sciatica  Obesity  Tobacco Abuse  Late  Complications of Amputation Stump ([facility])  Phantom Limb Pain ([facility])  Depression  Anxiety  Elevated Blood-Pressure Reading Without Diagnosis of Hypertension  Traumatic Arthropathy of Ankle and Foot  Pain in Joint Involving Ankle and Foot  Nonunion of Fracture  Primary Localized Osteoarthrosis of Ankle and Foot   Plan of Care  Pharmacotherapy (Medications Ordered): Meds ordered this  encounter  Medications  . tiZANidine (ZANAFLEX) 4 MG tablet    Sig: Take 1 tablet (4 mg total) by mouth every 8 (eight) hours as needed for muscle spasms.    Dispense:  90 tablet    Refill:  2    Order Specific Question:   Supervising Provider    Answer:   Milinda Pointer [phone]  . Oxycodone HCl 10 MG TABS    Sig: Take 1 tablet (10 mg total) by mouth every 6 (six) hours as needed. For chronic pain, to last for 30 days from fill date  To fill on or after: 09/14/2017,    Dispense:  120 tablet    Refill:  0    Do not place this medication, or any other prescription from our practice, on "Automatic Refill". Patient may have prescription filled one day early if pharmacy is closed on scheduled refill date.    Order Specific Question:   Supervising Provider    Answer:   Milinda Pointer [phone]  . gabapentin (NEURONTIN) 600 MG tablet    Sig: 1200 mg  BID    Dispense:  120 tablet    Refill:  2    Do not place this medication, or any other prescription from our practice, on "Automatic Refill". Patient may have prescription filled one day early if pharmacy is closed on scheduled refill date.    Order Specific Question:   Supervising Provider    Answer:   Milinda Pointer [phone]  . Oxycodone HCl 10 MG TABS    Sig: Take 1 tablet (10 mg total) by mouth every 6 (six) hours as needed.    Dispense:  120 tablet    Refill:  0    Do not place this medication, or any other prescription from our practice, on "Automatic Refill". Patient may have prescription filled one day early if pharmacy is closed on scheduled refill date. Do not fill until: 11/13/2017 To last until:12/13/2017    Order Specific Question:   Supervising Provider    Answer:   Gillis Santa [[id number]]  . Oxycodone HCl 10 MG TABS    Sig: Take 1 tablet (10 mg total) by mouth every 6 (six) hours as needed.    Dispense:  120 tablet    Refill:  0    Do not place this medication, or any other prescription from our practice, on "Automatic  Refill". Patient may have prescription filled one day early if pharmacy is closed on scheduled refill date. Do not fill until: 10/14/2017 To last until:11/13/2017    Order Specific Question:   Supervising Provider    Answer:   Gillis Santa [[id number]]   New Prescriptions   OXYCODONE HCL 10 MG TABS    Take 1 tablet (10 mg total) by mouth every 6 (six) hours as needed.   OXYCODONE HCL 10 MG TABS    Take 1 tablet (10 mg total) by mouth every 6 (six) hours as needed.   Medications administered today: [name] had no medications administered during this visit. Lab-work, procedure(s), and/or referral(s): Orders Placed This Encounter  Procedures  . SACROILIAC JOINT INJECTION  . ToxASSURE  Select 13 (MW), Urine   Imaging and/or referral(s): None  Interventional therapies: Planned, scheduled, and/or pending:   Bilateral SI joint NB   Provider-requested follow-up: Return in about 3 months (around 12/12/2017) for MedMgmt with Me Donella Stade Edison Pace).  Future Appointments  Date Time Provider [place]  12/05/2017  8:30 AM Volney American, PA-C [facility]   Primary Care Physician: Volney American, PA-C Location: [facility] Note by: Vevelyn Francois NP Date: 09/11/2017; Time: 9:39 AM  Pain Score Disclaimer: We use the NRS-11 scale. This is a self-reported, subjective measurement of pain severity with only modest accuracy. It is used primarily to identify changes within a particular patient. It must be understood that outpatient pain scales are significantly less accurate that those used for research, where they can be applied under ideal controlled circumstances with minimal exposure to variables. In reality, the score is likely to be a combination of pain intensity and pain affect, where pain affect describes the degree of emotional arousal or changes in action readiness caused by the sensory experience of pain. Factors such as social and work  situation, setting, emotional state, anxiety levels, expectation, and prior pain experience may influence pain perception and show large inter-individual differences that may also be affected by time variables.  Patient instructions provided during this appointment: Patient Instructions  ____________________________________________________________________________________________  Medication Rules  Applies to: All patients receiving prescriptions (written or electronic).  Pharmacy of record: Pharmacy where electronic prescriptions will be sent. If written prescriptions are taken to a different pharmacy, please inform the nursing staff. The pharmacy listed in the electronic medical record should be the one where you would like electronic prescriptions to be sent.  Prescription refills: Only during scheduled appointments. Applies to both, written and electronic prescriptions.  NOTE: The following applies primarily to controlled substances (Opioid* Pain Medications).   Patient's responsibilities: 1. Pain Pills: Bring all pain pills to every appointment (except for procedure appointments). 2. Pill Bottles: Bring pills in original pharmacy bottle. Always bring newest bottle. Bring bottle, even if empty. 3. Medication refills: You are responsible for knowing and keeping track of what medications you need refilled. The day before your appointment, write a list of all prescriptions that need to be refilled. Bring that list to your appointment and give it to the admitting nurse. Prescriptions will be written only during appointments. If you forget a medication, it will not be "Called in", "Faxed", or "electronically sent". You will need to get another appointment to get these prescribed. 4. Prescription Accuracy: You are responsible for carefully inspecting your prescriptions before leaving our office. Have the discharge nurse carefully go over each prescription with you, before taking them home. Make sure  that your name is accurately spelled, that your address is correct. Check the name and dose of your medication to make sure it is accurate. Check the number of pills, and the written instructions to make sure they are clear and accurate. Make sure that you are given enough medication to last until your next medication refill appointment. 5. Taking Medication: Take medication as prescribed. Never take more pills than instructed. Never take medication more frequently than prescribed. Taking less pills or less frequently is permitted and encouraged, when it comes to controlled substances (written prescriptions).  6. Inform other Doctors: Always inform, all of your healthcare providers, of all the medications you take. 7. Pain Medication from other Providers: You are not allowed to accept any additional pain medication from any other Doctor  or Healthcare provider. There are two exceptions to this rule. (see below) In the event that you require additional pain medication, you are responsible for notifying us, as stated below. 8. Medication Agreement: You are responsible for carefully reading and following our Medication Agreement. This must be signed before receiving any prescriptions from our practice. Safely store a copy of your signed Agreement. Violations to the Agreement will result in no further prescriptions. (Additional copies of our Medication Agreement are available upon request.) 9. Laws, Rules, & Regulations: All patients are expected to follow all Federal and [employer], Rules, [employer]. Ignorance of the Laws does not constitute a valid excuse. The use of any illegal substances is prohibited. 10. Adopted CDC guidelines & recommendations: Target dosing levels will be at or below 60 MME/day. Use of benzodiazepines** is not recommended.  Exceptions: There are only two exceptions to the rule of not receiving pain medications from other Healthcare Providers. 1. Exception #1 (Emergencies):  In the event of an emergency (i.e.: accident requiring emergency care), you are allowed to receive additional pain medication. However, you are responsible for: As soon as you are able, call our office [phone], at any time of the day or night, and leave a message stating your name, the date and nature of the emergency, and the name and dose of the medication prescribed. In the event that your call is answered by a member of our staff, make sure to document and save the date, time, and the name of the person that took your information.  2. Exception #2 (Planned Surgery): In the event that you are scheduled by another doctor or dentist to have any type of surgery or procedure, you are allowed (for a period no longer than 30 days), to receive additional pain medication, for the acute post-op pain. However, in this case, you are responsible for picking up a copy of our "Post-op Pain Management for Surgeons" handout, and giving it to your surgeon or dentist. This document is available at our office, and does not require an appointment to obtain it. Simply go to our office during business hours (Monday-Thursday from 8:00 AM to 4:00 PM) (Friday 8:00 AM to 12:00 Noon) or if you have a scheduled appointment with [place], prior to your surgery, and ask for it by name. In addition, you will need to provide [place] with your name, name of your surgeon, type of surgery, and date of procedure or surgery.  *Opioid medications include: morphine, codeine, oxycodone, oxymorphone, hydrocodone, hydromorphone, meperidine, tramadol, tapentadol, buprenorphine, fentanyl, methadone. **Benzodiazepine medications include: diazepam (Valium), alprazolam (Xanax), clonazepam (Klonopine), lorazepam (Ativan), clorazepate (Tranxene), chlordiazepoxide (Librium), estazolam (Prosom), oxazepam (Serax), temazepam (Restoril), triazolam (Halcion) (Last updated:  06/08/2017) ____________________________________________________________________________________________

## 2017-09-11 NOTE — Progress Notes (Signed)
Safety precautions to be maintained throughout the outpatient stay will include: orient to surroundings, keep bed in low position, maintain call bell within reach at all times, provide assistance with transfer out of bed and ambulation.   Pt brought bottle of pink and white pills. Label on bottle was too worn to be able to read information.

## 2017-09-11 NOTE — Patient Instructions (Signed)
____________________________________________________________________________________________  Medication Rules  Applies to: All patients receiving prescriptions (written or electronic).  Pharmacy of record: Pharmacy where electronic prescriptions will be sent. If written prescriptions are taken to a different pharmacy, please inform the nursing staff. The pharmacy listed in the electronic medical record should be the one where you would like electronic prescriptions to be sent.  Prescription refills: Only during scheduled appointments. Applies to both, written and electronic prescriptions.  NOTE: The following applies primarily to controlled substances (Opioid* Pain Medications).   Patient's responsibilities: 1. Pain Pills: Bring all pain pills to every appointment (except for procedure appointments). 2. Pill Bottles: Bring pills in original pharmacy bottle. Always bring newest bottle. Bring bottle, even if empty. 3. Medication refills: You are responsible for knowing and keeping track of what medications you need refilled. The day before your appointment, write a list of all prescriptions that need to be refilled. Bring that list to your appointment and give it to the admitting nurse. Prescriptions will be written only during appointments. If you forget a medication, it will not be "Called in", "Faxed", or "electronically sent". You will need to get another appointment to get these prescribed. 4. Prescription Accuracy: You are responsible for carefully inspecting your prescriptions before leaving our office. Have the discharge nurse carefully go over each prescription with you, before taking them home. Make sure that your name is accurately spelled, that your address is correct. Check the name and dose of your medication to make sure it is accurate. Check the number of pills, and the written instructions to make sure they are clear and accurate. Make sure that you are given enough medication to last  until your next medication refill appointment. 5. Taking Medication: Take medication as prescribed. Never take more pills than instructed. Never take medication more frequently than prescribed. Taking less pills or less frequently is permitted and encouraged, when it comes to controlled substances (written prescriptions).  6. Inform other Doctors: Always inform, all of your healthcare providers, of all the medications you take. 7. Pain Medication from other Providers: You are not allowed to accept any additional pain medication from any other Doctor or Healthcare provider. There are two exceptions to this rule. (see below) In the event that you require additional pain medication, you are responsible for notifying us, as stated below. 8. Medication Agreement: You are responsible for carefully reading and following our Medication Agreement. This must be signed before receiving any prescriptions from our practice. Safely store a copy of your signed Agreement. Violations to the Agreement will result in no further prescriptions. (Additional copies of our Medication Agreement are available upon request.) 9. Laws, Rules, & Regulations: All patients are expected to follow all Federal and State Laws, Statutes, Rules, & Regulations. Ignorance of the Laws does not constitute a valid excuse. The use of any illegal substances is prohibited. 10. Adopted CDC guidelines & recommendations: Target dosing levels will be at or below 60 MME/day. Use of benzodiazepines** is not recommended.  Exceptions: There are only two exceptions to the rule of not receiving pain medications from other Healthcare Providers. 1. Exception #1 (Emergencies): In the event of an emergency (i.e.: accident requiring emergency care), you are allowed to receive additional pain medication. However, you are responsible for: As soon as you are able, call our office [phone], at any time of the day or night, and leave a message stating your name, the  date and nature of the emergency, and the name and dose of the medication   prescribed. In the event that your call is answered by a member of our staff, make sure to document and save the date, time, and the name of the person that took your information.  2. Exception #2 (Planned Surgery): In the event that you are scheduled by another doctor or dentist to have any type of surgery or procedure, you are allowed (for a period no longer than 30 days), to receive additional pain medication, for the acute post-op pain. However, in this case, you are responsible for picking up a copy of our "Post-op Pain Management for Surgeons" handout, and giving it to your surgeon or dentist. This document is available at our office, and does not require an appointment to obtain it. Simply go to our office during business hours (Monday-Thursday from 8:00 AM to 4:00 PM) (Friday 8:00 AM to 12:00 Noon) or if you have a scheduled appointment with us, prior to your surgery, and ask for it by name. In addition, you will need to provide us with your name, name of your surgeon, type of surgery, and date of procedure or surgery.  *Opioid medications include: morphine, codeine, oxycodone, oxymorphone, hydrocodone, hydromorphone, meperidine, tramadol, tapentadol, buprenorphine, fentanyl, methadone. **Benzodiazepine medications include: diazepam (Valium), alprazolam (Xanax), clonazepam (Klonopine), lorazepam (Ativan), clorazepate (Tranxene), chlordiazepoxide (Librium), estazolam (Prosom), oxazepam (Serax), temazepam (Restoril), triazolam (Halcion) (Last updated: 06/08/2017) ____________________________________________________________________________________________    

## 2017-09-14 NOTE — Telephone Encounter (Signed)
Pharmacy called to verify Rx that was written to fill today.  Confirmation given to pharmacy.

## 2017-09-15 LAB — TOXASSURE SELECT 13 (MW), URINE

## 2017-09-20 ENCOUNTER — Ambulatory Visit
Admission: RE | Admit: 2017-09-20 | Discharge: 2017-09-20 | Disposition: A | Discharge status: Home / Self Care | Payer: Medicaid Other | Source: Ambulatory Visit | Attending: Student in an Organized Health Care Education/Training Program | Admitting: Student in an Organized Health Care Education/Training Program

## 2017-09-20 ENCOUNTER — Ambulatory Visit: Payer: Medicaid Other | Admitting: Student in an Organized Health Care Education/Training Program

## 2017-09-20 ENCOUNTER — Encounter: Payer: Self-pay | Admitting: Student in an Organized Health Care Education/Training Program

## 2017-09-20 ENCOUNTER — Other Ambulatory Visit: Payer: Self-pay

## 2017-09-20 VITALS — BP 124/88 | HR 90 | Temp 97.9°F | Resp 14 | Ht 72.0 in | Wt 325.0 lb

## 2017-09-20 DIAGNOSIS — Z79899 Other long term (current) drug therapy: Secondary | ICD-10-CM | POA: Diagnosis not present

## 2017-09-20 DIAGNOSIS — M545 Low back pain: Secondary | ICD-10-CM | POA: Diagnosis present

## 2017-09-20 DIAGNOSIS — G8929 Other chronic pain: Secondary | ICD-10-CM

## 2017-09-20 DIAGNOSIS — M461 Sacroiliitis, not elsewhere classified: Secondary | ICD-10-CM | POA: Insufficient documentation

## 2017-09-20 DIAGNOSIS — M533 Sacrococcygeal disorders, not elsewhere classified: Secondary | ICD-10-CM | POA: Insufficient documentation

## 2017-09-20 DIAGNOSIS — M47818 Spondylosis without myelopathy or radiculopathy, sacral and sacrococcygeal region: Secondary | ICD-10-CM

## 2017-09-20 DIAGNOSIS — Z888 Allergy status to other drugs, medicaments and biological substances status: Secondary | ICD-10-CM | POA: Insufficient documentation

## 2017-09-20 MED ORDER — LIDOCAINE HCL 1 % IJ SOLN
10.0000 mL | Freq: Once | INTRAMUSCULAR | Status: AC
  Administered 2017-09-20: 10 mL

## 2017-09-20 MED ORDER — DEXAMETHASONE SODIUM PHOSPHATE 10 MG/ML IJ SOLN
10.0000 mg | Freq: Once | INTRAMUSCULAR | Status: AC
  Administered 2017-09-20: 10 mg

## 2017-09-20 MED ORDER — DEXAMETHASONE SODIUM PHOSPHATE 10 MG/ML IJ SOLN
INTRAMUSCULAR | Status: AC
  Filled 2017-09-20: qty 1

## 2017-09-20 MED ORDER — LIDOCAINE HCL (PF) 1 % IJ SOLN
INTRAMUSCULAR | Status: AC
  Filled 2017-09-20: qty 10

## 2017-09-20 MED ORDER — ROPIVACAINE HCL 2 MG/ML IJ SOLN
10.0000 mL | Freq: Once | INTRAMUSCULAR | Status: AC
  Administered 2017-09-20: 10 mL

## 2017-09-20 NOTE — Patient Instructions (Signed)

## 2017-09-20 NOTE — Progress Notes (Signed)
Safety precautions to be maintained throughout the outpatient stay will include: orient to surroundings, keep bed in low position, maintain call bell within reach at all times, provide assistance with transfer out of bed and ambulation.  

## 2017-09-20 NOTE — Progress Notes (Signed)
Patient's Name: [name] Day.  MRN: [id number]  Referring Provider: Volney American,*  DOB: [birth year]  PCP: Volney American, PA-C  DOS: 09/20/2017  Note by: Gillis Santa, MD  Service setting: Ambulatory outpatient  Specialty: Interventional Pain Management  Patient type: Established  Location: [facility] (AMB) Pain Management Facility  Visit type: Interventional Procedure   Primary Reason for Visit: Interventional Pain Management Treatment. CC: Back Pain (lower)  Procedure:  Anesthesia, Analgesia, Anxiolysis:  Type: Diagnostic Sacroiliac Joint Steroid Injection #2  Region: Inferior Lumbosacral Region Level: PIIS (Posterior Inferior Iliac Spine) Laterality: Bilateral  Type: Local Anesthesia Indication(s): Analgesia         Route: Infiltration (Glendo/IM) IV Access: Declined Sedation: Declined  Local Anesthetic: Lidocaine 1-2%   Indications: 1. Chronic sacroiliac joint pain   2. SI joint arthritis    Pain Score: Pre-procedure: 6 /10 Post-procedure: 0-No pain/10  Pre-op Assessment:  [name] Day is a 57 y.o. (year old), male patient, seen today for interventional treatment. He  has a past surgical history that includes Leg amputation; Hand surgery; and Leg Surgery. [name] Day has a current medication list which includes the following prescription(s): acetaminophen, albuterol, gabapentin, naproxen sodium, oxycodone hcl, oxycodone hcl, oxycodone hcl, tizanidine, and famotidine. His primarily concern today is the Back Pain (lower)  Initial Vital Signs:  Pulse/HCG Rate: 97  Temp: 97.9 F (36.6 C) Resp: 20 BP: 131/81 SpO2: 95 %  BMI: Estimated body mass index is 44.08 kg/m as calculated from the following:   Height as of this encounter: 6' (1.829 m).   Weight as of this encounter: 325 lb (147.4 kg).  Risk Assessment: Allergies: Reviewed. He is allergic to tramadol.  Allergy Precautions: None required Coagulopathies: Reviewed. None identified.  Blood-thinner  therapy: None at this time Active Infection(s): Reviewed. None identified. [name] Day is afebrile  Site Confirmation: [name] Day was asked to confirm the procedure and laterality before marking the site Procedure checklist: Completed Consent: Before the procedure and under the influence of no sedative(s), amnesic(s), or anxiolytics, the patient was informed of the treatment options, risks and possible complications. To fulfill our ethical and legal obligations, as recommended by the American Medical Association's Code of Ethics, I have informed the patient of my clinical impression; the nature and purpose of the treatment or procedure; the risks, benefits, and possible complications of the intervention; the alternatives, including doing nothing; the risk(s) and benefit(s) of the alternative treatment(s) or procedure(s); and the risk(s) and benefit(s) of doing nothing. The patient was provided information about the general risks and possible complications associated with the procedure. These may include, but are not limited to: failure to achieve desired goals, infection, bleeding, organ or nerve damage, allergic reactions, paralysis, and death. In addition, the patient was informed of those risks and complications associated to the procedure, such as failure to decrease pain; infection; bleeding; organ or nerve damage with subsequent damage to sensory, motor, and/or autonomic systems, resulting in permanent pain, numbness, and/or weakness of one or several areas of the body; allergic reactions; (i.e.: anaphylactic reaction); and/or death. Furthermore, the patient was informed of those risks and complications associated with the medications. These include, but are not limited to: allergic reactions (i.e.: anaphylactic or anaphylactoid reaction(s)); adrenal axis suppression; blood sugar elevation that in diabetics may result in ketoacidosis or comma; water retention that in patients with history of  congestive heart failure may result in shortness of breath, pulmonary edema, and decompensation with resultant heart failure; weight gain; swelling or edema; medication-induced  neural toxicity; particulate matter embolism and blood vessel occlusion with resultant organ, and/or nervous system infarction; and/or aseptic necrosis of one or more joints. Finally, the patient was informed that Medicine is not an exact science; therefore, there is also the possibility of unforeseen or unpredictable risks and/or possible complications that may result in a catastrophic outcome. The patient indicated having understood very clearly. We have given the patient no guarantees and we have made no promises. Enough time was given to the patient to ask questions, all of which were answered to the patient's satisfaction. [name] Day has indicated that he wanted to continue with the procedure. Attestation: I, the ordering provider, attest that I have discussed with the patient the benefits, risks, side-effects, alternatives, likelihood of achieving goals, and potential problems during recovery for the procedure that I have provided informed consent. Date  Time: 09/20/2017 10:16 AM  Pre-Procedure Preparation:  Monitoring: As per clinic protocol. Respiration, ETCO2, SpO2, BP, heart rate and rhythm monitor placed and checked for adequate function Safety Precautions: Patient was assessed for positional comfort and pressure points before starting the procedure. Time-out: I initiated and conducted the "Time-out" before starting the procedure, as per protocol. The patient was asked to participate by confirming the accuracy of the "Time Out" information. Verification of the correct person, site, and procedure were performed and confirmed by me, the nursing staff, and the patient. "Time-out" conducted as per Joint Commission's Universal Protocol (UP.01.01.01). Time: 1143  Description of Procedure:       Position: Prone Target  Area: Inferior, posterior, aspect of the sacroiliac fissure Approach: Posterior, paraspinal, ipsilateral approach. Area Prepped: Entire Lower Lumbosacral Region Prepping solution: ChloraPrep (2% chlorhexidine gluconate and 70% isopropyl alcohol) Safety Precautions: Aspiration looking for blood return was conducted prior to all injections. At no point did we inject any substances, as a needle was being advanced. No attempts were made at seeking any paresthesias. Safe injection practices and needle disposal techniques used. Medications properly checked for expiration dates. SDV (single dose vial) medications used. Description of the Procedure: Protocol guidelines were followed. The patient was placed in position over the procedure table. The target area was identified and the area prepped in the usual manner. Skin & deeper tissues infiltrated with local anesthetic. Appropriate amount of time allowed to pass for local anesthetics to take effect. The procedure needle was advanced under fluoroscopic guidance into the sacroiliac joint until a firm endpoint was obtained. Proper needle placement secured. Negative aspiration confirmed. Solution injected in intermittent fashion, asking for systemic symptoms every 0.5cc of injectate. The needles were then removed and the area cleansed, making sure to leave some of the prepping solution back to take advantage of its long term bactericidal properties. Vitals:   09/20/17 1102 09/20/17 1143 09/20/17 1148 09/20/17 1157  BP: 131/81 (!) 146/86 122/87 124/88  Pulse: 97 91 91 90  Resp: 20 12 13 14   Temp: 97.9 F (36.6 C)     TempSrc: Oral     SpO2: 95% 95% 96% 97%  Weight: (!) 325 lb (147.4 kg)     Height: 6' (1.829 m)       Start Time: 1143 hrs. End Time: 1150 hrs. Materials:  Needle(s) Type: Regular needle Gauge: 22G Length: 3.5-in Medication(s): Please see orders for medications and dosing details. 10 cc solution made of 9 cc of 0.2% ropivacaine, 1 cc of  Decadron 10 mg/cc.  2.5 cc injected intra-articular, 2.5 cc injected periarticular on each side. Imaging Guidance (Non-Spinal):  Type of Imaging  Technique: Fluoroscopy Guidance (Non-Spinal) Indication(s): Assistance in needle guidance and placement for procedures requiring needle placement in or near specific anatomical locations not easily accessible without such assistance. Exposure Time: Please see nurses notes. Contrast: None used. Fluoroscopic Guidance: I was personally present during the use of fluoroscopy. "Tunnel Vision Technique" used to obtain the best possible view of the target area. Parallax error corrected before commencing the procedure. "Direction-depth-direction" technique used to introduce the needle under continuous pulsed fluoroscopy. Once target was reached, antero-posterior, oblique, and lateral fluoroscopic projection used confirm needle placement in all planes. Images permanently stored in EMR. Interpretation: No contrast injected.  Antibiotic Prophylaxis:   Anti-infectives (From admission, onward)   None     Indication(s): None identified  Post-operative Assessment:  Post-procedure Vital Signs:  Pulse/HCG Rate: 90  Temp: 97.9 F (36.6 C) Resp: 14 BP: 124/88 SpO2: 97 %  EBL: None  Complications: No immediate post-treatment complications observed by team, or reported by patient.  Note: The patient tolerated the entire procedure well. A repeat set of vitals were taken after the procedure and the patient was kept under observation following institutional policy, for this type of procedure. Post-procedural neurological assessment was performed, showing return to baseline, prior to discharge. The patient was provided with post-procedure discharge instructions, including a section on how to identify potential problems. Should any problems arise concerning this procedure, the patient was given instructions to immediately contact us, at any time, without hesitation. In  any case, we plan to contact the patient by telephone for a follow-up status report regarding this interventional procedure.  Comments:  No additional relevant information.  Plan of Care   Imaging Orders     DG C-Arm 1-60 Min-No Report Procedure Orders    No procedure(s) ordered today    Medications ordered for procedure: Meds ordered this encounter  Medications  . lidocaine (XYLOCAINE) 1 % (with pres) injection 10 mL  . ropivacaine (PF) 2 mg/mL (0.2%) (NAROPIN) injection 10 mL  . dexamethasone (DECADRON) injection 10 mg   Medications administered: We administered lidocaine, ropivacaine (PF) 2 mg/mL (0.2%), and dexamethasone.  See the medical record for exact dosing, route, and time of administration.  New Prescriptions   No medications on file   Disposition: Discharge home  Discharge Date & Time: 09/20/2017; 1156 hrs.    Future Appointments  Date Time Provider [place]  12/05/2017  8:30 AM Volney American, [place] [facility]  12/12/2017  8:45 AM Vevelyn Francois, NP [facility] None   Primary Care Physician: Nyra Capes Location: [facility] Note by: Gillis Santa, MD Date: 09/20/2017; Time: 3:08 PM  Disclaimer:  Medicine is not an exact science. The only guarantee in medicine is that nothing is guaranteed. It is important to note that the decision to proceed with this intervention was based on the information collected from the patient. The Data and conclusions were drawn from the patient's questionnaire, the interview, and the physical examination. Because the information was provided in large part by the patient, it cannot be guaranteed that it has not been purposely or unconsciously manipulated. Every effort has been made to obtain as much relevant data as possible for this evaluation. It is important to note that the conclusions that lead to this procedure are derived in large part from the available data. Always take  into account that the treatment will also be dependent on availability of resources and existing treatment guidelines, considered by other Pain Management Practitioners as being common  knowledge and practice, at the time of the intervention. For Medico-Legal purposes, it is also important to point out that variation in procedural techniques and pharmacological choices are the acceptable norm. The indications, contraindications, technique, and results of the above procedure should only be interpreted and judged by a Board-Certified Interventional Pain Specialist with extensive familiarity and expertise in the same exact procedure and technique.

## 2017-09-21 ENCOUNTER — Telehealth: Payer: Self-pay | Admitting: *Deleted

## 2017-09-21 NOTE — Telephone Encounter (Signed)
Attempted to call for post procedure follow-up. Message left. 

## 2017-10-14 ENCOUNTER — Other Ambulatory Visit: Payer: Self-pay

## 2017-10-14 ENCOUNTER — Emergency Department: Payer: Medicaid Other

## 2017-10-14 ENCOUNTER — Emergency Department
Admission: EM | Admit: 2017-10-14 | Discharge: 2017-10-14 | Disposition: A | Discharge status: Home / Self Care | Payer: Medicaid Other | Attending: Emergency Medicine | Admitting: Emergency Medicine

## 2017-10-14 ENCOUNTER — Encounter: Payer: Self-pay | Admitting: Emergency Medicine

## 2017-10-14 DIAGNOSIS — F17210 Nicotine dependence, cigarettes, uncomplicated: Secondary | ICD-10-CM | POA: Insufficient documentation

## 2017-10-14 DIAGNOSIS — Y939 Activity, unspecified: Secondary | ICD-10-CM | POA: Insufficient documentation

## 2017-10-14 DIAGNOSIS — Y929 Unspecified place or not applicable: Secondary | ICD-10-CM | POA: Diagnosis not present

## 2017-10-14 DIAGNOSIS — X58XXXA Exposure to other specified factors, initial encounter: Secondary | ICD-10-CM | POA: Diagnosis not present

## 2017-10-14 DIAGNOSIS — Z79899 Other long term (current) drug therapy: Secondary | ICD-10-CM | POA: Insufficient documentation

## 2017-10-14 DIAGNOSIS — Y999 Unspecified external cause status: Secondary | ICD-10-CM | POA: Diagnosis not present

## 2017-10-14 DIAGNOSIS — S80822A Blister (nonthermal), left lower leg, initial encounter: Secondary | ICD-10-CM | POA: Insufficient documentation

## 2017-10-14 DIAGNOSIS — Z89512 Acquired absence of left leg below knee: Secondary | ICD-10-CM | POA: Insufficient documentation

## 2017-10-14 DIAGNOSIS — L02416 Cutaneous abscess of left lower limb: Secondary | ICD-10-CM | POA: Diagnosis not present

## 2017-10-14 DIAGNOSIS — L03116 Cellulitis of left lower limb: Secondary | ICD-10-CM | POA: Diagnosis not present

## 2017-10-14 DIAGNOSIS — M79605 Pain in left leg: Secondary | ICD-10-CM | POA: Diagnosis not present

## 2017-10-14 DIAGNOSIS — T148XXA Other injury of unspecified body region, initial encounter: Secondary | ICD-10-CM

## 2017-10-14 MED ORDER — OXYCODONE-ACETAMINOPHEN 5-325 MG PO TABS
1.0000 | ORAL_TABLET | Freq: Once | ORAL | Status: AC
  Administered 2017-10-14: 1 via ORAL
  Filled 2017-10-14: qty 1

## 2017-10-14 MED ORDER — CLINDAMYCIN HCL 300 MG PO CAPS: 300.0000 mg | ORAL_CAPSULE | Freq: Three times a day (TID) | ORAL | 0 refills | Status: AC

## 2017-10-14 NOTE — ED Triage Notes (Signed)
C/O pain to base of stump x 1 day.  Left BKA stump with silver dollar size raised sore, draining serosanguinous drainage.  Patient also c/o stump being swollen and difficult to put sleeve on and off and stump being tight in prosthetic.

## 2017-10-14 NOTE — ED Notes (Signed)
Pt states his stump has been sweating in the artificial leg and caused swelling and a blister which popped. Wound was dressed in triage. vss and nonfebrile.

## 2017-10-14 NOTE — ED Provider Notes (Signed)
[facility] Emergency Department Provider Note  ____________________________________________  Time seen: Approximately 1:24 PM  I have reviewed the triage vital signs and the nursing notes.   HISTORY  Chief Complaint Leg Pain    HPI [name] Day. is a 57 y.o. male that presents to the emergency department for evaluation of stump pain and blister for 3 days.  Patient states that he has been walking and sweating a lot in his artificial leg.  He noticed a small blister to the bottom of his stump 3 days ago.  He is having pain around the blister.  Patient had a BKA in 2013.  No fever or chills.  Past Medical History:  Diagnosis Date  . Depression   . Hx of BKA ([place])    due to complicated fracture   . Panic attack     Patient Active Problem List   Diagnosis Date Noted  . Obesity 09/11/2017  . Tobacco abuse 09/11/2017  . Lumbar spondylosis 09/11/2017  . Chronic sacroiliac joint pain 09/11/2017  . Chronic bilateral low back pain with left-sided sciatica 09/11/2017  . S/P BKA (below knee amputation) unilateral, left ([place]) 03/29/2017  . Chronic pain syndrome 03/29/2017  . 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[place]) 03/29/2017  . Chronic left hip pain 03/04/2017  . 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 Phantom limb pain ([place]) 01/03/2014  . Depression 07/19/2013  . Anxiety 07/19/2013  . Elevated blood-pressure reading without diagnosis of hypertension 01/18/2013  . 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 Pain in joint involving ankle and foot 11/09/2011  . 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 Primary localized osteoarthrosis of ankle and foot 10/03/2011    Past Surgical History:  Procedure Laterality Date  . HAND SURGERY    . LEG AMPUTATION    . LEG SURGERY      Prior to Admission medications   Medication Sig Start Date End Date Taking? Authorizing Provider  acetaminophen (TYLENOL) 500 MG tablet  Take 500 mg by mouth every 6 (six) hours as needed.    [provider]  albuterol (PROVENTIL HFA;VENTOLIN HFA) 108 (90 Base) MCG/ACT inhaler Inhale 2 puffs into the lungs every 6 (six) hours as needed for wheezing or shortness of breath. 01/01/17   Merlyn Lot, MD  clindamycin (CLEOCIN) 300 MG capsule Take 1 capsule (300 mg total) by mouth 3 (three) times daily for 10 days. 10/14/17 10/24/17  Laban Emperor, PA-C  famotidine (PEPCID) 40 MG tablet Take 1 tablet (40 mg total) by mouth every evening. Patient not taking: Reported on 09/20/2017 08/07/17 08/07/18  Nance Pear, MD  gabapentin (NEURONTIN) 600 MG tablet 1200 mg  BID 09/14/17   Vevelyn Francois, NP  naproxen sodium (ALEVE) 220 MG tablet Take 220 mg by mouth.    [provider]  Oxycodone HCl 10 MG TABS Take 1 tablet (10 mg total) by mouth every 6 (six) hours as needed. For chronic pain, to last for 30 days from fill date  To fill on or after: 09/14/2017, 09/14/17 10/14/17  Vevelyn Francois, NP  Oxycodone HCl 10 MG TABS Take 1 tablet (10 mg total) by mouth every 6 (six) hours as needed. 11/13/17 12/13/17  Vevelyn Francois, NP  Oxycodone HCl 10 MG TABS Take 1 tablet (10 mg total) by mouth every 6 (six) hours as needed. 10/14/17 11/13/17  Vevelyn Francois, NP  tiZANidine (ZANAFLEX) 4 MG tablet Take 1 tablet (4 mg total) by  mouth every 8 (eight) hours as needed for muscle spasms. 09/11/17 12/10/17  Vevelyn Francois, NP    Allergies Tramadol  Family History  Problem Relation Age of Onset  . Cancer Mother        Mastatic  . Diabetes Father   . Hypertension Father   . Heart disease Father   . Alzheimer's disease Maternal Grandmother   . Cancer Maternal Grandfather   . Cancer Paternal Grandmother     Social History Social History   Tobacco Use  . Smoking status: Current Every Day Smoker    Packs/day: 0.50    Types: Cigarettes  . Smokeless tobacco: Former Network engineer Use Topics  . Alcohol use: No  . Drug use: No     Review  of Systems  Constitutional: No fever/chills Cardiovascular: No chest pain. Respiratory: No SOB. Gastrointestinal: No abdominal pain.  No nausea, no vomiting.  Musculoskeletal: Positive for stump pain.  Skin: Negative for abrasions, lacerations, ecchymosis.   ____________________________________________   PHYSICAL EXAM:  VITAL SIGNS: ED Triage Vitals  Enc Vitals Group     BP 10/14/17 1245 134/79     Pulse Rate 10/14/17 1245 99     Resp 10/14/17 1245 16     Temp 10/14/17 1245 97.8 F (36.6 C)     Temp Source 10/14/17 1245 Oral     SpO2 10/14/17 1245 96 %     Weight 10/14/17 1244 (!) 320 lb (145.2 kg)     Height 10/14/17 1244 6' (1.829 m)     Head Circumference --      Peak Flow --      Pain Score 10/14/17 1244 6     Pain Loc --      Pain Edu? --      Excl. in [place]? --      Constitutional: Alert and oriented. Well appearing and in no acute distress. Eyes: Conjunctivae are normal. PERRL. EOMI. Head: Atraumatic. ENT:      Ears:      Nose: No congestion/rhinnorhea.      Mouth/Throat: Mucous membranes are moist.  Neck: No stridor.  Cardiovascular: Normal rate, regular rhythm.  Good peripheral circulation. Respiratory: Normal respiratory effort without tachypnea or retractions. Lungs CTAB. Good air entry to the bases with no decreased or absent breath sounds. Musculoskeletal: Full range of motion to all extremities.  Neurologic:  Normal speech and language. No gross focal neurologic deficits are appreciated.  Skin:  Skin is warm, dry. 1/2cm by 1/2cm dark grey blister to stump with 1cm surrounding tenderness to palpation and erythema.  Psychiatric: Mood and affect are normal. Speech and behavior are normal. Patient exhibits appropriate insight and judgement.   ____________________________________________   LABS (all labs ordered are listed, but only abnormal results are displayed)  Labs Reviewed - No data to  display ____________________________________________  EKG   ____________________________________________  RADIOLOGY  Dg Tibia/fibula Left  Result Date: 10/14/2017 CLINICAL DATA:  Stump pain EXAM: LEFT TIBIA AND FIBULA - 2 VIEW COMPARISON:  None. FINDINGS: Two views study shows sequelae of BKA. No gas within the soft tissues. No worrisome lytic or sclerotic bony abnormality. IMPRESSION: Unremarkable postoperative appearance in this patient status post BKA. Electronically Signed   By: Misty Stanley M.D.   On: 10/14/2017 14:17    ____________________________________________    PROCEDURES  Procedure(s) performed:    Procedures  INCISION AND DRAINAGE Performed by: Laban Emperor Consent: Verbal consent obtained. Risks and benefits: risks, benefits and alternatives were discussed Type: abscess  Body area: left stump  Incision was made with a scalpel.  Local anesthetic: none  Complexity: complex  Drainage: serosanguinous and a minimal amount of purulent drainage.  Drainage amount: 1cc  Packing material: none  Patient tolerance: Patient tolerated the procedure well with no immediate complications.    Medications  oxyCODONE-acetaminophen (PERCOCET/ROXICET) 5-325 MG per tablet 1 tablet (1 tablet Oral Given 10/14/17 1500)     ____________________________________________   INITIAL IMPRESSION / ASSESSMENT AND PLAN / ED COURSE  Pertinent labs & imaging results that were available during my care of the patient were reviewed by me and considered in my medical decision making (see chart for details).  Review of the  CSRS was performed in accordance of the [place] prior to dispensing any controlled drugs.   Patient's diagnosis is consistent with blister and cellulitis.  Exam are reassuring.  No acute abnormalities on x-ray.  Blister was drained in ED with concerns of possible infection leading to cellulitis. Area drained serosanguineous and purulent drainage.  Patient will  be discharged home with prescriptions for clindamycin.  He is agreeable to follow-up to the emergency department if pain and erythema worsens.  Patient is to follow up with wound care as directed. Patient is given ED precautions to return to the ED for any worsening or new symptoms.     ____________________________________________  FINAL CLINICAL IMPRESSION(S) / ED DIAGNOSES  Final diagnoses:  Blister  Cellulitis of left lower extremity      NEW MEDICATIONS STARTED DURING THIS VISIT:  ED Discharge Orders        Ordered    clindamycin (CLEOCIN) 300 MG capsule  3 times daily     10/14/17 1501          This chart was dictated using voice recognition software/Dragon. Despite best efforts to proofread, errors can occur which can change the meaning. Any change was purely unintentional.    Laban Emperor, PA-C 10/14/17 1533    Schaevitz, Randall An, MD 10/14/17 [phone]

## 2017-10-14 NOTE — ED Notes (Signed)
Pt ambulatory to wheel chair upon discharge. Verbalized understanding of discharge instructions, follow-up care and prescription. VSS. Wound dressed by Manuela Schwartz, RN prior to discharge. Crutch instruction by this RN with teachback and demonstration by pt. A&O x4.

## 2017-12-05 ENCOUNTER — Other Ambulatory Visit: Payer: Self-pay

## 2017-12-05 ENCOUNTER — Encounter: Payer: Self-pay | Admitting: Family Medicine

## 2017-12-05 ENCOUNTER — Ambulatory Visit (INDEPENDENT_AMBULATORY_CARE_PROVIDER_SITE_OTHER): Payer: Medicaid Other | Admitting: Family Medicine

## 2017-12-05 VITALS — BP 138/88 | HR 96 | Temp 97.3°F | Ht 71.0 in | Wt 337.1 lb

## 2017-12-05 DIAGNOSIS — Z1159 Encounter for screening for other viral diseases: Secondary | ICD-10-CM | POA: Diagnosis not present

## 2017-12-05 DIAGNOSIS — Z1211 Encounter for screening for malignant neoplasm of colon: Secondary | ICD-10-CM

## 2017-12-05 DIAGNOSIS — Z23 Encounter for immunization: Secondary | ICD-10-CM

## 2017-12-05 DIAGNOSIS — Z0000 Encounter for general adult medical examination without abnormal findings: Secondary | ICD-10-CM | POA: Diagnosis not present

## 2017-12-05 DIAGNOSIS — Z114 Encounter for screening for human immunodeficiency virus [HIV]: Secondary | ICD-10-CM

## 2017-12-05 LAB — UA/M W/RFLX CULTURE, ROUTINE
Bilirubin, UA: NEGATIVE
KETONES UA: NEGATIVE
LEUKOCYTES UA: NEGATIVE
NITRITE UA: NEGATIVE
PH UA: 7.5 (ref 5.0–7.5)
RBC, UA: NEGATIVE
Specific Gravity, UA: 1.015 (ref 1.005–1.030)
Urobilinogen, Ur: 0.2 mg/dL (ref 0.2–1.0)

## 2017-12-05 LAB — MICROSCOPIC EXAMINATION
Bacteria, UA: NONE SEEN
RBC MICROSCOPIC, UA: NONE SEEN /HPF (ref 0–2)

## 2017-12-05 NOTE — Patient Instructions (Signed)
Tdap Vaccine (Tetanus, Diphtheria and Pertussis): What You Need to Know 1. Why get vaccinated? Tetanus, diphtheria and pertussis are very serious diseases. Tdap vaccine can protect us from these diseases. And, Tdap vaccine given to pregnant women can protect newborn babies against pertussis. TETANUS (Lockjaw) is rare in the [place] today. It causes painful muscle tightening and stiffness, usually all over the body.  It can lead to tightening of muscles in the head and neck so you can't open your mouth, swallow, or sometimes even breathe. Tetanus kills about 1 out of 10 people who are infected even after receiving the best medical care.  DIPHTHERIA is also rare in the [place] today. It can cause a thick coating to form in the back of the throat.  It can lead to breathing problems, heart failure, paralysis, and death.  PERTUSSIS (Whooping Cough) causes severe coughing spells, which can cause difficulty breathing, vomiting and disturbed sleep.  It can also lead to weight loss, incontinence, and rib fractures. Up to 2 in 100 adolescents and 5 in 100 adults with pertussis are hospitalized or have complications, which could include pneumonia or death.  These diseases are caused by bacteria. Diphtheria and pertussis are spread from person to person through secretions from coughing or sneezing. Tetanus enters the body through cuts, scratches, or wounds. Before vaccines, as many as 200,000 cases of diphtheria, 200,000 cases of pertussis, and hundreds of cases of tetanus, were reported in the [place] each year. Since vaccination began, reports of cases for tetanus and diphtheria have dropped by about 99% and for pertussis by about 80%. 2. Tdap vaccine Tdap vaccine can protect adolescents and adults from tetanus, diphtheria, and pertussis. One dose of Tdap is routinely given at age 11 or 12. People who did not get Tdap at that age should get it as soon as possible. Tdap is especially  important for healthcare professionals and anyone having close contact with a baby younger than 12 months. Pregnant women should get a dose of Tdap during every pregnancy, to protect the newborn from pertussis. Infants are most at risk for severe, life-threatening complications from pertussis. Another vaccine, called Td, protects against tetanus and diphtheria, but not pertussis. A Td booster should be given every 10 years. Tdap may be given as one of these boosters if you have never gotten Tdap before. Tdap may also be given after a severe cut or burn to prevent tetanus infection. Your doctor or the person giving you the vaccine can give you more information. Tdap may safely be given at the same time as other vaccines. 3. Some people should not get this vaccine  A person who has ever had a life-threatening allergic reaction after a previous dose of any diphtheria, tetanus or pertussis containing vaccine, OR has a severe allergy to any part of this vaccine, should not get Tdap vaccine. Tell the person giving the vaccine about any severe allergies.  Anyone who had coma or long repeated seizures within 7 days after a childhood dose of DTP or DTaP, or a previous dose of Tdap, should not get Tdap, unless a cause other than the vaccine was found. They can still get Td.  Talk to your doctor if you: ? have seizures or another nervous system problem, ? had severe pain or swelling after any vaccine containing diphtheria, tetanus or pertussis, ? ever had a condition called Guillain-Barr Syndrome (GBS), ? aren't feeling well on the day the shot is scheduled. 4. Risks With any medicine, including   vaccines, there is a chance of side effects. These are usually mild and go away on their own. Serious reactions are also possible but are rare. Most people who get Tdap vaccine do not have any problems with it. Mild problems following Tdap: (Did not interfere with activities)  Pain where the shot was given (about  3 in 4 adolescents or 2 in 3 adults)  Redness or swelling where the shot was given (about 1 person in 5)  Mild fever of at least 100.4F (up to about 1 in 25 adolescents or 1 in 100 adults)  Headache (about 3 or 4 people in 10)  Tiredness (about 1 person in 3 or 4)  Nausea, vomiting, diarrhea, stomach ache (up to 1 in 4 adolescents or 1 in 10 adults)  Chills, sore joints (about 1 person in 10)  Body aches (about 1 person in 3 or 4)  Rash, swollen glands (uncommon)  Moderate problems following Tdap: (Interfered with activities, but did not require medical attention)  Pain where the shot was given (up to 1 in 5 or 6)  Redness or swelling where the shot was given (up to about 1 in 16 adolescents or 1 in 12 adults)  Fever over 102F (about 1 in 100 adolescents or 1 in 250 adults)  Headache (about 1 in 7 adolescents or 1 in 10 adults)  Nausea, vomiting, diarrhea, stomach ache (up to 1 or 3 people in 100)  Swelling of the entire arm where the shot was given (up to about 1 in 500).  Severe problems following Tdap: (Unable to perform usual activities; required medical attention)  Swelling, severe pain, bleeding and redness in the arm where the shot was given (rare).  Problems that could happen after any vaccine:  People sometimes faint after a medical procedure, including vaccination. Sitting or lying down for about 15 minutes can help prevent fainting, and injuries caused by a fall. Tell your doctor if you feel dizzy, or have vision changes or ringing in the ears.  Some people get severe pain in the shoulder and have difficulty moving the arm where a shot was given. This happens very rarely.  Any medication can cause a severe allergic reaction. Such reactions from a vaccine are very rare, estimated at fewer than 1 in a million doses, and would happen within a few minutes to a few hours after the vaccination. As with any medicine, there is a very remote chance of a vaccine  causing a serious injury or death. The safety of vaccines is always being monitored. For more information, visit: www.cdc.gov/vaccinesafety/ 5. What if there is a serious problem? What should I look for? Look for anything that concerns you, such as signs of a severe allergic reaction, very high fever, or unusual behavior. Signs of a severe allergic reaction can include hives, swelling of the face and throat, difficulty breathing, a fast heartbeat, dizziness, and weakness. These would usually start a few minutes to a few hours after the vaccination. What should I do?  If you think it is a severe allergic reaction or other emergency that can't wait, call 9-1-1 or get the person to the nearest hospital. Otherwise, call your doctor.  Afterward, the reaction should be reported to the Vaccine Adverse Event Reporting System (VAERS). Your doctor might file this report, or you can do it yourself through the VAERS web site at www.vaers.hhs.gov, or by calling 1-800-822-7967. ? VAERS does not give medical advice. 6. The National Vaccine Injury Compensation Program The National   Vaccine Injury Compensation Program (VICP) is a federal program that was created to compensate people who may have been injured by certain vaccines. Persons who believe they may have been injured by a vaccine can learn about the program and about filing a claim by calling 1-800-338-2382 or visiting the VICP website at www.hrsa.gov/vaccinecompensation. There is a time limit to file a claim for compensation. 7. How can I learn more?  Ask your doctor. He or she can give you the vaccine package insert or suggest other sources of information.  Call your local or state health department.  Contact the Centers for Disease Control and Prevention (CDC): ? Call 1-800-232-4636 (1-800-CDC-INFO) or ? Visit CDC's website at www.cdc.gov/vaccines CDC Tdap Vaccine VIS (06/04/13) This information is not intended to replace advice given to you by your  health care provider. Make sure you discuss any questions you have with your health care provider. Document Released: 09/27/2011 Document Revised: 12/17/2015 Document Reviewed: 12/17/2015 Elsevier Interactive Patient Education  2017 [employer] Inc[employer]  

## 2017-12-05 NOTE — Progress Notes (Signed)
BP 138/88   Pulse 96   Temp (!) 97.3 F (36.3 C) (Oral)   Ht 5\' 11"  (1.803 m)   Wt (!) 337 lb 1.6 oz (152.9 kg)   SpO2 95%   BMI 47.02 kg/m    Subjective:    Patient ID: [name] Day., male    DOB: 06/19/1960, 57 y.o.   MRN: [id number]  HPI: [name] Day. is a 57 y.o. male presenting on 12/05/2017 for comprehensive medical examination. Current medical complaints include:none  Depression Screen done today and results listed below:  Depression screen [facility] 2/9 12/05/2017 09/11/2017 06/21/2017 06/08/2017 04/18/2017  Decreased Interest 0 0 0 0 0  Down, Depressed, Hopeless 0 0 0 0 0  PHQ - 2 Score 0 0 0 0 0  Altered sleeping 2 - - - -  Tired, decreased energy 2 - - - -  Change in appetite 2 - - - -  Feeling bad or failure about yourself  0 - - - -  Trouble concentrating 0 - - - -  Moving slowly or fidgety/restless 1 - - - -  Suicidal thoughts 0 - - - -  PHQ-9 Score 7 - - - -    The patient does not have a history of falls. I did not complete a risk assessment for falls. A plan of care for falls was not documented.   Past Medical History:  Past Medical History:  Diagnosis Date  . Depression   . 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 Panic attack     Surgical History:  Past Surgical History:  Procedure Laterality Date  . HAND SURGERY    . LEG AMPUTATION    . LEG SURGERY      Medications:  Current Outpatient Medications on File Prior to Visit  Medication Sig  . acetaminophen (TYLENOL) 500 MG tablet Take 500 mg by mouth every 6 (six) hours as needed.  . gabapentin (NEURONTIN) 600 MG tablet 1200 mg  BID  . naproxen sodium (ALEVE) 220 MG tablet Take 220 mg by mouth.  . Oxycodone HCl 10 MG TABS Take 1 tablet (10 mg total) by mouth every 6 (six) hours as needed.  Marland Kitchen tiZANidine (ZANAFLEX) 4 MG tablet Take 1 tablet (4 mg total) by mouth every 8 (eight) hours as needed for muscle spasms.   No current facility-administered medications on file prior  to visit.     Allergies:  Allergies  Allergen Reactions  . Tramadol Diarrhea    Severe Headaches    Social History:  Social History   Socioeconomic History  . Marital status: Married    Spouse name: Not on file  . Number of children: Not on file  . Years of education: Not on file  . Highest education level: Not on file  Occupational History  . Not on file  Social Needs  . Financial resource strain: Not on file  . Food insecurity:    Worry: Not on file    Inability: Not on file  . Transportation needs:    Medical: Not on file    Non-medical: Not on file  Tobacco Use  . Smoking status: Current Every Day Smoker    Packs/day: 0.50    Types: Cigarettes  . Smokeless tobacco: Former Network engineer and Sexual Activity  . Alcohol use: No  . Drug use: No  . Sexual activity: Not on file  Lifestyle  . Physical activity:  Days per week: Not on file    Minutes per session: Not on file  . Stress: Not on file  Relationships  . Social connections:    Talks on phone: Not on file    Gets together: Not on file    Attends religious service: Not on file    Active member of club or organization: Not on file    Attends meetings of clubs or organizations: Not on file    Relationship status: Not on file  . Intimate partner violence:    Fear of current or ex partner: Not on file    Emotionally abused: Not on file    Physically abused: Not on file    Forced sexual activity: Not on file  Other Topics Concern  . Not on file  Social History Narrative  . Not on file   Social History   Tobacco Use  Smoking Status Current Every Day Smoker  . Packs/day: 0.50  . Types: Cigarettes  Smokeless Tobacco Former Systems developer   Social History   Substance and Sexual Activity  Alcohol Use No    Family History:  Family History  Problem Relation Age of Onset  . Cancer Mother        Mastatic  . Diabetes Father   . Hypertension Father   . Heart disease Father   . Alzheimer's disease  Maternal Grandmother   . Cancer Maternal Grandfather   . Cancer Paternal Grandmother     Past medical history, surgical history, medications, allergies, family history and social history reviewed with patient today and changes made to appropriate areas of the chart.   Review of Systems - General ROS: negative Psychological ROS: positive for - anxiety, depression and irritability Ophthalmic ROS: negative ENT ROS: negative Allergy and Immunology ROS: negative Hematological and Lymphatic ROS: negative Endocrine ROS: negative Respiratory ROS: no cough, shortness of breath, or wheezing Cardiovascular ROS: no chest pain or dyspnea on exertion Gastrointestinal ROS: no abdominal pain, change in bowel habits, or black or bloody stools Genito-Urinary ROS: no dysuria, trouble voiding, or hematuria Musculoskeletal ROS: positive for - chronic joint pains Neurological ROS: no TIA or stroke symptoms Dermatological ROS: abrasions left side of body from recent fall All other ROS negative except what is listed above and in the HPI.      Objective:    BP 138/88   Pulse 96   Temp (!) 97.3 F (36.3 C) (Oral)   Ht 5\' 11"  (1.803 m)   Wt (!) 337 lb 1.6 oz (152.9 kg)   SpO2 95%   BMI 47.02 kg/m   Wt Readings from Last 3 Encounters:  12/05/17 (!) 337 lb 1.6 oz (152.9 kg)  10/14/17 (!) 320 lb (145.2 kg)  09/20/17 (!) 325 lb (147.4 kg)    Physical Exam  Constitutional: He is oriented to person, place, and time. He appears well-developed and well-nourished. No distress.  HENT:  Head: Atraumatic.  Right Ear: External ear normal.  Left Ear: External ear normal.  Nose: Nose normal.  Mouth/Throat: Oropharynx is clear and moist.  Eyes: Pupils are equal, round, and reactive to light. Conjunctivae are normal. No scleral icterus.  Neck: Normal range of motion. Neck supple.  Cardiovascular: Normal rate, regular rhythm, normal heart sounds and intact distal pulses.  No murmur heard. Pulmonary/Chest:  Effort normal and breath sounds normal. No respiratory distress.  Abdominal: Soft. Bowel sounds are normal. He exhibits no distension and no mass. There is no tenderness. There is no guarding.  Reproducible umbilical  hernia  Genitourinary:  Genitourinary Comments: GU exam declined  Musculoskeletal: He exhibits no edema.  ROM at baseline S/p left BKA  Neurological: He is alert and oriented to person, place, and time. He has normal reflexes.  Skin: Skin is warm and dry. No rash noted.  Healing abrasions to left forearm and elbow as well as to limb stump left leg  Psychiatric: He has a normal mood and affect. His behavior is normal.  Nursing note and vitals reviewed.  Results for orders placed or performed in visit on 09/11/17  ToxASSURE Select 13 (MW), Urine  Result Value Ref Range   Summary FINAL       Assessment & Plan:   Problem List Items Addressed This Visit    None    Visit Diagnoses    Annual physical exam    -  Primary   Relevant Orders   CBC with Differential/Platelet   Comprehensive metabolic panel   Lipid Panel w/o Chol/HDL Ratio   UA/M w/rflx Culture, Routine   Need for diphtheria-tetanus-pertussis (Tdap) vaccine       Encounter for screening colonoscopy       Relevant Orders   Ambulatory referral to Gastroenterology   Encounter for screening for HIV       Relevant Orders   HIV antibody   Need for hepatitis C screening test       Relevant Orders   Hepatitis C antibody       Discussed aspirin prophylaxis for myocardial infarction prevention and decision was it was not indicated  LABORATORY TESTING:  Health maintenance labs ordered today as discussed above.   The natural history of prostate cancer and ongoing controversy regarding screening and potential treatment outcomes of prostate cancer has been discussed with the patient. The meaning of a false positive PSA and a false negative PSA has been discussed. He indicates understanding of the limitations of  this screening test and wishes not to proceed with screening PSA testing.  IMMUNIZATIONS:   - Tdap: Tetanus vaccination status reviewed: postponed. - Influenza: Refused  SCREENING: - Colonoscopy: Ordered today  Discussed with patient purpose of the colonoscopy is to detect colon cancer at curable precancerous or early stages   PATIENT COUNSELING:    Sexuality: Discussed sexually transmitted diseases, partner selection, use of condoms, avoidance of unintended pregnancy  and contraceptive alternatives.   Advised to avoid cigarette smoking.  I discussed with the patient that most people either abstain from alcohol or drink within safe limits (<=14/week and <=4 drinks/occasion for males, <=7/weeks and <= 3 drinks/occasion for females) and that the risk for alcohol disorders and other health effects rises proportionally with the number of drinks per week and how often a drinker exceeds daily limits.  Discussed cessation/primary prevention of drug use and availability of treatment for abuse.   Diet: Encouraged to adjust caloric intake to maintain  or achieve ideal body weight, to reduce intake of dietary saturated fat and total fat, to limit sodium intake by avoiding high sodium foods and not adding table salt, and to maintain adequate dietary potassium and calcium preferably from fresh fruits, vegetables, and low-fat dairy products.    stressed the importance of regular exercise  Injury prevention: Discussed safety belts, safety helmets, smoke detector, smoking near bedding or upholstery.   Dental health: Discussed importance of regular tooth brushing, flossing, and dental visits.   Follow up plan: NEXT PREVENTATIVE PHYSICAL DUE IN 1 YEAR. Return in about 1 week (around 12/12/2017) for F/u.

## 2017-12-06 LAB — CBC WITH DIFFERENTIAL/PLATELET
BASOS ABS: 0 10*3/uL (ref 0.0–0.2)
Basos: 0 %
EOS (ABSOLUTE): 0.1 10*3/uL (ref 0.0–0.4)
Eos: 1 %
HEMOGLOBIN: 15.6 g/dL (ref 13.0–17.7)
Hematocrit: 45.2 % (ref 37.5–51.0)
IMMATURE GRANULOCYTES: 1 %
Immature Grans (Abs): 0.1 10*3/uL (ref 0.0–0.1)
LYMPHS ABS: 2.1 10*3/uL (ref 0.7–3.1)
LYMPHS: 20 %
MCH: 30.5 pg (ref 26.6–33.0)
MCHC: 34.5 g/dL (ref 31.5–35.7)
MCV: 89 fL (ref 79–97)
MONOCYTES: 4 %
Monocytes Absolute: 0.4 10*3/uL (ref 0.1–0.9)
NEUTROS PCT: 74 %
Neutrophils Absolute: 7.7 10*3/uL — ABNORMAL HIGH (ref 1.4–7.0)
Platelets: 206 10*3/uL (ref 150–450)
RBC: 5.11 x10E6/uL (ref 4.14–5.80)
RDW: 14 % (ref 12.3–15.4)
WBC: 10.4 10*3/uL (ref 3.4–10.8)

## 2017-12-06 LAB — LIPID PANEL W/O CHOL/HDL RATIO
CHOLESTEROL TOTAL: 179 mg/dL (ref 100–199)
HDL: 30 mg/dL — ABNORMAL LOW (ref >39)
LDL CALC: 97 mg/dL (ref 0–99)
TRIGLYCERIDES: 261 mg/dL — AB (ref 0–149)
VLDL CHOLESTEROL CAL: 52 mg/dL — AB (ref 5–40)

## 2017-12-06 LAB — COMPREHENSIVE METABOLIC PANEL
ALK PHOS: 71 IU/L (ref 39–117)
ALT: 26 IU/L (ref 0–44)
AST: 18 IU/L (ref 0–40)
Albumin/Globulin Ratio: 1.4 (ref 1.2–2.2)
Albumin: 4.2 g/dL (ref 3.5–5.5)
BUN / CREAT RATIO: 17 (ref 9–20)
BUN: 11 mg/dL (ref 6–24)
Bilirubin Total: 0.3 mg/dL (ref 0.0–1.2)
CALCIUM: 9.3 mg/dL (ref 8.7–10.2)
CO2: 24 mmol/L (ref 20–29)
CREATININE: 0.64 mg/dL — AB (ref 0.76–1.27)
Chloride: 97 mmol/L (ref 96–106)
GFR calc Af Amer: 127 mL/min/{1.73_m2} (ref >59)
GFR calc non Af Amer: 110 mL/min/{1.73_m2} (ref >59)
GLUCOSE: 304 mg/dL — AB (ref 65–99)
Globulin, Total: 2.9 g/dL (ref 1.5–4.5)
Potassium: 4.8 mmol/L (ref 3.5–5.2)
Sodium: 137 mmol/L (ref 134–144)
Total Protein: 7.1 g/dL (ref 6.0–8.5)

## 2017-12-06 LAB — HIV ANTIBODY (ROUTINE TESTING W REFLEX): HIV Screen 4th Generation wRfx: NONREACTIVE

## 2017-12-06 LAB — HEPATITIS C ANTIBODY: Hep C Virus Ab: <0.1 s/co ratio (ref 0.0–0.9)

## 2017-12-12 ENCOUNTER — Other Ambulatory Visit: Payer: Self-pay

## 2017-12-12 ENCOUNTER — Ambulatory Visit: Payer: Medicaid Other | Attending: Nurse Practitioner | Admitting: Nurse Practitioner

## 2017-12-12 ENCOUNTER — Encounter: Payer: Self-pay | Admitting: Nurse Practitioner

## 2017-12-12 VITALS — BP 144/69 | HR 94 | Temp 98.3°F | Resp 16 | Ht 72.0 in | Wt 326.0 lb

## 2017-12-12 DIAGNOSIS — Z6841 Body Mass Index (BMI) 40.0 and over, adult: Secondary | ICD-10-CM | POA: Diagnosis not present

## 2017-12-12 DIAGNOSIS — G894 Chronic pain syndrome: Secondary | ICD-10-CM | POA: Insufficient documentation

## 2017-12-12 DIAGNOSIS — M47896 Other spondylosis, lumbar region: Secondary | ICD-10-CM | POA: Insufficient documentation

## 2017-12-12 DIAGNOSIS — M533 Sacrococcygeal disorders, not elsewhere classified: Secondary | ICD-10-CM | POA: Insufficient documentation

## 2017-12-12 DIAGNOSIS — F17210 Nicotine dependence, cigarettes, uncomplicated: Secondary | ICD-10-CM | POA: Diagnosis not present

## 2017-12-12 DIAGNOSIS — E669 Obesity, unspecified: Secondary | ICD-10-CM | POA: Insufficient documentation

## 2017-12-12 DIAGNOSIS — Z5181 Encounter for therapeutic drug level monitoring: Secondary | ICD-10-CM | POA: Diagnosis not present

## 2017-12-12 DIAGNOSIS — Z79899 Other long term (current) drug therapy: Secondary | ICD-10-CM | POA: Diagnosis not present

## 2017-12-12 DIAGNOSIS — R030 Elevated blood-pressure reading, without diagnosis of hypertension: Secondary | ICD-10-CM | POA: Diagnosis not present

## 2017-12-12 DIAGNOSIS — M5442 Lumbago with sciatica, left side: Secondary | ICD-10-CM | POA: Diagnosis not present

## 2017-12-12 DIAGNOSIS — M47816 Spondylosis without myelopathy or radiculopathy, lumbar region: Secondary | ICD-10-CM

## 2017-12-12 DIAGNOSIS — M25552 Pain in left hip: Secondary | ICD-10-CM | POA: Diagnosis not present

## 2017-12-12 DIAGNOSIS — F410 Panic disorder [episodic paroxysmal anxiety] without agoraphobia: Secondary | ICD-10-CM | POA: Insufficient documentation

## 2017-12-12 DIAGNOSIS — G8929 Other chronic pain: Secondary | ICD-10-CM

## 2017-12-12 DIAGNOSIS — G546 Phantom limb syndrome with pain: Secondary | ICD-10-CM | POA: Diagnosis not present

## 2017-12-12 DIAGNOSIS — F329 Major depressive disorder, single episode, unspecified: Secondary | ICD-10-CM | POA: Diagnosis not present

## 2017-12-12 DIAGNOSIS — Z89512 Acquired absence of left leg below knee: Secondary | ICD-10-CM | POA: Insufficient documentation

## 2017-12-12 DIAGNOSIS — Z79891 Long term (current) use of opiate analgesic: Secondary | ICD-10-CM | POA: Diagnosis not present

## 2017-12-12 MED ORDER — GABAPENTIN 600 MG PO TABS: ORAL_TABLET | ORAL | 2 refills | Status: DC

## 2017-12-12 MED ORDER — OXYCODONE HCL 10 MG PO TABS: 10.0000 mg | ORAL_TABLET | Freq: Four times a day (QID) | ORAL | 0 refills | Status: DC | PRN

## 2017-12-12 NOTE — Progress Notes (Signed)
  Nursing Pain Medication Assessment:  Safety precautions to be maintained throughout the outpatient stay will include: orient to surroundings, keep bed in low position, maintain call bell within reach at all times, provide assistance with transfer out of bed and ambulation.  Medication Inspection Compliance: Pill count conducted under aseptic conditions, in front of the patient. Neither the pills nor the bottle was removed from the patient's sight at any time. Once count was completed pills were immediately returned to the patient in their original bottle.  Medication: Oxycodone IR Pill/Patch Count: 0 of 120 pills remain Pill/Patch Appearance: Markings consistent with prescribed medication Bottle Appearance: Standard pharmacy container. Clearly labeled. Filled Date: 8 / 5 / 2019 Last Medication intake:  TodayNursing Pain Medication Assessment:  Safety precautions to be maintained throughout the outpatient stay will include: orient to surroundings, keep bed in low position, maintain call bell within reach at all times, provide assistance with transfer out of bed and ambulation.  Medication Inspection Compliance: [name] Day did not comply with our request to bring his pills to be counted. He was reminded that bringing the medication bottles, even when empty, is a requirement.  Medication: None brought in. Pill/Patch Count: None available to be counted. Bottle Appearance: No container available. Did not bring bottle(s) to appointment. Filled Date: N/A Last Medication intake:  Today   Patient states that he did not bring medication today that he left bottle in his truck which is in [place] and will get it and bring it back. States that he did take extra when he feel.

## 2017-12-12 NOTE — Patient Instructions (Addendum)
________________________________________________________________*____________________________________________________________________________________________  Preparing for your procedure (without sedation)  Instructions: . Oral Intake: Do not eat or drink anything for at least 3 hours prior to your procedure. . Transportation: Unless otherwise stated by your physician, you may drive yourself after the procedure. . Blood Pressure Medicine: Take your blood pressure medicine with a sip of water the morning of the procedure. . Blood thinners: Notify our staff if you are taking any blood thinners. Depending on which one you take, there will be specific instructions on how and when to stop it. . Diabetics on insulin: Notify the staff so that you can be scheduled 1st case in the morning. If your diabetes requires high dose insulin, take only  of your normal insulin dose the morning of the procedure and notify the staff that you have done so. . Preventing infections: Shower with an antibacterial soap the morning of your procedure.  . Build-up your immune system: Take 1000 mg of Vitamin C with every meal (3 times a day) the day prior to your procedure. Marland Kitchen Antibiotics: Inform the staff if you have a condition or reason that requires you to take antibiotics before dental procedures. . Pregnancy: If you are pregnant, call and cancel the procedure. . Sickness: If you have a cold, fever, or any active infections, call and cancel the procedure. . Arrival: You must be in the facility at least 30 minutes prior to your scheduled procedure. . Children: Do not bring any children with you. . Dress appropriately: Bring dark clothing that you would not mind if they get stained. . Valuables: Do not bring any jewelry or valuables.  Procedure appointments are reserved for interventional treatments only. Marland Kitchen No Prescription Refills. . No medication changes will be discussed during procedure appointments. . No disability  issues will be discussed.  Reasons to call and reschedule or cancel your procedure: (Following these recommendations will minimize the risk of a serious complication.) . Surgeries: Avoid having procedures within 2 weeks of any surgery. (Avoid for 2 weeks before or after any surgery). . Flu Shots: Avoid having procedures within 2 weeks of a flu shots or . (Avoid for 2 weeks before or after immunizations). . Barium: Avoid having a procedure within 7-10 days after having had a radiological study involving the use of radiological contrast. (Myelograms, Barium swallow or enema study). . Heart attacks: Avoid any elective procedures or surgeries for the initial 6 months after a "Myocardial Infarction" (Heart Attack). . Blood thinners: It is imperative that you stop these medications before procedures. Let us know if you if you take any blood thinner.  . Infection: Avoid procedures during or within two weeks of an infection (including chest colds or gastrointestinal problems). Symptoms associated with infections include: Localized redness, fever, chills, night sweats or profuse sweating, burning sensation when voiding, cough, congestion, stuffiness, runny nose, sore throat, diarrhea, nausea, vomiting, cold or Flu symptoms, recent or current infections. It is specially important if the infection is over the area that we intend to treat. Marland Kitchen Heart and lung problems: Symptoms that may suggest an active cardiopulmonary problem include: cough, chest pain, breathing difficulties or shortness of breath, dizziness, ankle swelling, uncontrolled high or unusually low blood pressure, and/or palpitations. If you are experiencing any of these symptoms, cancel your procedure and contact your primary care physician for an evaluation.  Remember:  Regular Business hours are:  Monday to Thursday 8:00 AM to 4:00 PM  Provider's Schedule: Milinda Pointer, MD:  Procedure days: Tuesday and Thursday 7:30 AM to  4:00 PM  Gillis Santa, MD:  Procedure days: Monday and Wednesday 7:30 AM to 4:00 PM ____________________________________________________________________________________________  Margretta Sidle pain medication bottle to office and Prescriptions will be given.___________________________  Medication Rules  Applies to: All patients receiving prescriptions (written or electronic).  Pharmacy of record: Pharmacy where electronic prescriptions will be sent. If written prescriptions are taken to a different pharmacy, please inform the nursing staff. The pharmacy listed in the electronic medical record should be the one where you would like electronic prescriptions to be sent.  Prescription refills: Only during scheduled appointments. Applies to both, written and electronic prescriptions.  NOTE: The following applies primarily to controlled substances (Opioid* Pain Medications).   Patient's responsibilities: 1. Pain Pills: Bring all pain pills to every appointment (except for procedure appointments). 2. Pill Bottles: Bring pills in original pharmacy bottle. Always bring newest bottle. Bring bottle, even if empty. 3. Medication refills: You are responsible for knowing and keeping track of what medications you need refilled. The day before your appointment, write a list of all prescriptions that need to be refilled. Bring that list to your appointment and give it to the admitting nurse. Prescriptions will be written only during appointments. If you forget a medication, it will not be "Called in", "Faxed", or "electronically sent". You will need to get another appointment to get these prescribed. 4. Prescription Accuracy: You are responsible for carefully inspecting your prescriptions before leaving our office. Have the discharge nurse carefully go over each prescription with you, before taking them home. Make sure that your name is accurately spelled, that your address is correct. Check the name and dose of your medication to make  sure it is accurate. Check the number of pills, and the written instructions to make sure they are clear and accurate. Make sure that you are given enough medication to last until your next medication refill appointment. 5. Taking Medication: Take medication as prescribed. Never take more pills than instructed. Never take medication more frequently than prescribed. Taking less pills or less frequently is permitted and encouraged, when it comes to controlled substances (written prescriptions).  6. Inform other Doctors: Always inform, all of your healthcare providers, of all the medications you take. 7. Pain Medication from other Providers: You are not allowed to accept any additional pain medication from any other Doctor or Healthcare provider. There are two exceptions to this rule. (see below) In the event that you require additional pain medication, you are responsible for notifying us, as stated below. 8. Medication Agreement: You are responsible for carefully reading and following our Medication Agreement. This must be signed before receiving any prescriptions from our practice. Safely store a copy of your signed Agreement. Violations to the Agreement will result in no further prescriptions. (Additional copies of our Medication Agreement are available upon request.) 9. Laws, Rules, & Regulations: All patients are expected to follow all Federal and [employer], [employer], Rules, [employer]. Ignorance of the Laws does not constitute a valid excuse. The use of any illegal substances is prohibited. 10. Adopted CDC guidelines & recommendations: Target dosing levels will be at or below 60 MME/day. Use of benzodiazepines** is not recommended.  Exceptions: There are only two exceptions to the rule of not receiving pain medications from other Healthcare Providers. 1. Exception #1 (Emergencies): In the event of an emergency (i.e.: accident requiring emergency care), you are allowed to receive additional pain  medication. However, you are responsible for: As soon as you are able, call our office [phone],  at any time of the day or night, and leave a message stating your name, the date and nature of the emergency, and the name and dose of the medication prescribed. In the event that your call is answered by a member of our staff, make sure to document and save the date, time, and the name of the person that took your information.  2. Exception #2 (Planned Surgery): In the event that you are scheduled by another doctor or dentist to have any type of surgery or procedure, you are allowed (for a period no longer than 30 days), to receive additional pain medication, for the acute post-op pain. However, in this case, you are responsible for picking up a copy of our "Post-op Pain Management for Surgeons" handout, and giving it to your surgeon or dentist. This document is available at our office, and does not require an appointment to obtain it. Simply go to our office during business hours (Monday-Thursday from 8:00 AM to 4:00 PM) (Friday 8:00 AM to 12:00 Noon) or if you have a scheduled appointment with [place], prior to your surgery, and ask for it by name. In addition, you will need to provide [place] with your name, name of your surgeon, type of surgery, and date of procedure or surgery.  *Opioid medications include: morphine, codeine, oxycodone, oxymorphone, hydrocodone, hydromorphone, meperidine, tramadol, tapentadol, buprenorphine, fentanyl, methadone. **Benzodiazepine medications include: diazepam (Valium), alprazolam (Xanax), clonazepam (Klonopine), lorazepam (Ativan), clorazepate (Tranxene), chlordiazepoxide (Librium), estazolam (Prosom), oxazepam (Serax), temazepam (Restoril), triazolam (Halcion) (Last updated: 06/08/2017) ____________________________________________________________________________________________    BMI Assessment: Estimated body mass index is 44.21 kg/m as calculated from the following:    Height as of this encounter: 6' (1.829 m).   Weight as of this encounter: 326 lb (147.9 kg).  BMI interpretation table: BMI level Category Range association with higher incidence of chronic pain  <18 kg/m2 Underweight   18.5-24.9 kg/m2 Ideal body weight   25-29.9 kg/m2 Overweight Increased incidence by 20%  30-34.9 kg/m2 Obese (Class I) Increased incidence by 68%  35-39.9 kg/m2 Severe obesity (Class II) Increased incidence by 136%  >40 kg/m2 Extreme obesity (Class III) Increased incidence by 254%   Patient's current BMI Ideal Body weight  Body mass index is 44.21 kg/m. Ideal body weight: 77.6 kg (171 lb 1.2 oz) Adjusted ideal body weight: 105.7 kg (233 lb 0.7 oz)   BMI Readings from Last 4 Encounters:  12/12/17 44.21 kg/m  12/05/17 47.02 kg/m  10/14/17 43.40 kg/m  09/20/17 44.08 kg/m   Wt Readings from Last 4 Encounters:  12/12/17 (!) 326 lb (147.9 kg)  12/05/17 (!) 337 lb 1.6 oz (152.9 kg)  10/14/17 (!) 320 lb (145.2 kg)  09/20/17 (!) 325 lb (147.4 kg)  Sacroiliac (SI) Joint Injection Patient Information  Description: The sacroiliac joint connects the scrum (very low back and tailbone) to the ilium (a pelvic bone which also forms half of the hip joint).  Normally this joint experiences very little motion.  When this joint becomes inflamed or unstable low back and or hip and pelvis pain may result.  Injection of this joint with local anesthetics (numbing medicines) and steroids can provide diagnostic information and reduce pain.  This injection is performed with the aid of x-ray guidance into the tailbone area while you are lying on your stomach.   You may experience an electrical sensation down the leg while this is being done.  You may also experience numbness.  We also may ask if we are reproducing your normal pain during  the injection.  Conditions which may be treated SI injection:   Low back, buttock, hip or leg pain  Preparation for the Injection:  3. Do not eat any  solid food or dairy products within 8 hours of your appointment.  4. You may drink clear liquids up to 3 hours before appointment.  Clear liquids include water, black coffee, juice or soda.  No milk or cream please. 5. You may take your regular medications, including pain medications with a sip of water before your appointment.  Diabetics should hold regular insulin (if take separately) and take 1/2 normal NPH dose the morning of the procedure.  Carry some sugar containing items with you to your appointment. 6. A driver must accompany you and be prepared to drive you home after your procedure. 7. Bring all of your current medications with you. 8. An IV may be inserted and sedation may be given at the discretion of the physician. 9. A blood pressure cuff, EKG and other monitors will often be applied during the procedure.  Some patients may need to have extra oxygen administered for a short period.  10. You will be asked to provide medical information, including your allergies, prior to the procedure.  We must know immediately if you are taking blood thinners (like Coumadin/Warfarin) or if you are allergic to IV iodine contrast (dye).  We must know if you could possible be pregnant.  Possible side effects:   Bleeding from needle site  Infection (rare, may require surgery)  Nerve injury (rare)  Numbness & tingling (temporary)  A brief convulsion or seizure  Light-headedness (temporary)  Pain at injection site (several days)  Decreased blood pressure (temporary)  Weakness in the leg (temporary)   Call if you experience:   New onset weakness or numbness of an extremity below the injection site that last more than 8 hours.  Hives or difficulty breathing ( go to the emergency room)  Inflammation or drainage at the injection site  Any new symptoms which are concerning to you  Please note:  Although the local anesthetic injected can often make your back/ hip/ buttock/ leg feel good  for several hours after the injections, the pain will likely return.  It takes 3-7 days for steroids to work in the sacroiliac area.  You may not notice any pain relief for at least that one week.  If effective, we will often do a series of three injections spaced 3-6 weeks apart to maximally decrease your pain.  After the initial series, we generally will wait some months before a repeat injection of the same type.  If you have any questions, please call [phone] [facility]

## 2017-12-12 NOTE — Progress Notes (Signed)
Patient's Name: [name] Day.  MRN: [id number]  Referring Provider: Volney American,*  DOB: 12-Feb-1961  PCP: Volney American, PA-C  DOS: 12/12/2017  Note by: Vevelyn Francois NP  Service setting: Ambulatory outpatient  Specialty: Interventional Pain Management  Location: [facility] (AMB) Pain Management Facility    Patient type: Established    Primary Reason(s) for Visit: Encounter for prescription drug management. (Level of risk: moderate)  CC: Back Pain (lower)  HPI  [name] Day is a 57 y.o. year old, male patient, who comes today for a medication management evaluation. He has Depression; Chronic left hip pain; S/P BKA (below knee amputation) unilateral, left ([place]); Chronic pain syndrome; 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[place]); Anxiety; Elevated blood-pressure reading without diagnosis of hypertension; Pain in joint involving ankle and foot; Late complications of amputation stump ([place]); Nonunion of fracture; Obesity; Phantom limb pain ([place]); Primary localized osteoarthrosis of ankle and foot; Tobacco abuse; Traumatic arthropathy of ankle and foot; Lumbar spondylosis; Chronic sacroiliac joint pain; Chronic bilateral low back pain with left-sided sciatica; and Long term current use of opiate analgesic on their problem list. His primarily concern today is the Back Pain (lower)  Pain Assessment: Location: Lower, Right, Left Back Radiating: hips bilateral down left leg to knee Onset: More than a month ago Duration: Chronic pain Quality: Aching, Constant, Radiating, Discomfort, Grimacing("slammming hand in car door in your face") Severity: 6 /10 (subjective, self-reported pain score)  Note: Reported level is compatible with observation. Clinically the patient looks like a 2/10 A 2/10 is viewed as "Mild to Moderate" and described as noticeable and distracting. Impossible to hide from other people. More frequent flare-ups. Still possible to adapt and function close to  normal. It can be very annoying and may have occasional stronger flare-ups. With discipline, patients may get used to it and adapt.       When using our objective Pain Scale, levels between 6 and 10/10 are said to belong in an emergency room, as it progressively worsens from a 6/10, described as severely limiting, requiring emergency care not usually available at an outpatient pain management facility. At a 6/10 level, communication becomes difficult and requires great effort. Assistance to reach the emergency department may be required. Facial flushing and profuse sweating along with potentially dangerous increases in heart rate and blood pressure will be evident. Effect on ADL: prolonged walking, sitting, lifting arms, reaching, bending over Timing: Constant Modifying factors: medications, rest BP: (!) 144/69  HR: 94  [name] Day was last scheduled for an appointment on 09/11/2017 for medication management. During today's appointment we reviewed [name] Day chronic pain status, as well as his outpatient medication regimen.  He suffered a fall.  He admits that his leg got hung went out from under him.  The patient  reports that he does not use drugs. His body mass index is 44.21 kg/m.  Further details on both, my assessment(s), as well as the proposed treatment plan, please see below.  Controlled Substance Pharmacotherapy Assessment REMS (Risk Evaluation and Mitigation Strategy)  Analgesic:Oxycodone 10 mg QID prn  MME/day:60 MME Ignatius Specking, RN  12/12/2017  3:07 PM  Sign at close encounter  Nursing Pain Medication Assessment:  Safety precautions to be maintained throughout the outpatient stay will include: orient to surroundings, keep bed in low position, maintain call bell within reach at all times, provide assistance with transfer out of bed and ambulation.  Medication Inspection Compliance: Pill count conducted under aseptic conditions, in front  of the patient. Neither the pills  nor the bottle was removed from the patient's sight at any time. Once count was completed pills were immediately returned to the patient in their original bottle.  Medication: Oxycodone IR Pill/Patch Count: 0 of 120 pills remain Pill/Patch Appearance: Markings consistent with prescribed medication Bottle Appearance: Standard pharmacy container. Clearly labeled. Filled Date: 8 / 5 / 2019 Last Medication intake:  TodayNursing Pain Medication Assessment:  Safety precautions to be maintained throughout the outpatient stay will include: orient to surroundings, keep bed in low position, maintain call bell within reach at all times, provide assistance with transfer out of bed and ambulation.  Medication Inspection Compliance: [name] did not comply with our request to bring his pills to be counted. He was reminded that bringing the medication bottles, even when empty, is a requirement.  Medication: None brought in. Pill/Patch Count: None available to be counted. Bottle Appearance: No container available. Did not bring bottle(s) to appointment. Filled Date: N/A Last Medication intake:  Today   Patient states that he did not bring medication today that he left bottle in his truck which is in [place] and will get it and bring it back. States that he did take extra when he feel.   Pharmacokinetics: Liberation and absorption (onset of action): WNL Distribution (time to peak effect): WNL Metabolism and excretion (duration of action): WNL         Pharmacodynamics: Desired effects: Analgesia: [name] reports >50% benefit. Functional ability: Patient reports that medication allows him to accomplish basic ADLs Clinically meaningful improvement in function (CMIF): Sustained CMIF goals met Perceived effectiveness: Described as relatively effective, allowing for increase in activities of daily living (ADL) Undesirable effects: Side-effects or Adverse reactions: None reported Monitoring: [place]  PMP: Online review of the past 55-monthperiod conducted. Compliant with practice rules and regulations Last UDS on record: Summary  Date Value Ref Range Status  09/11/2017 FINAL  Final    Comment:    ==================================================================== TOXASSURE SELECT 13 (MW) ==================================================================== Test                             Result       Flag       Units Drug Present and Declared for Prescription Verification   Oxycodone                      647          EXPECTED   ng/mg creat   Oxymorphone                    1165         EXPECTED   ng/mg creat   Noroxycodone                   539          EXPECTED   ng/mg creat   Noroxymorphone                 203          EXPECTED   ng/mg creat    Sources of oxycodone are scheduled prescription medications.    Oxymorphone, noroxycodone, and noroxymorphone are expected    metabolites of oxycodone. Oxymorphone is also available as a    scheduled prescription medication. ==================================================================== Test  Result    Flag   Units      Ref Range   Creatinine              186              mg/dL      >=20 ==================================================================== Declared Medications:  The flagging and interpretation on this report are based on the  following declared medications.  Unexpected results may arise from  inaccuracies in the declared medications.  **Note: The testing scope of this panel includes these medications:  Oxycodone  **Note: The testing scope of this panel does not include following  reported medications:  Acetaminophen (Tylenol)  Albuterol  Famotidine (Pepcid)  Gabapentin  Naproxen  Tizanidine (Zanaflex) ==================================================================== For clinical consultation, please call [phone]. ====================================================================    UDS interpretation: Compliant          Medication Assessment Form: Reviewed. Patient indicates being compliant with therapy Treatment compliance: Non-compliant Risk Assessment Profile: Aberrant behavior: failure to bring medications for pill counts Comorbid factors increasing risk of overdose: caucasian and male gender Opioid risk tool (ORT) (Total Score): 0 Personal History of Substance Abuse (SUD-Substance use disorder):  Alcohol: Negative  Illegal Drugs: Negative  Rx Drugs: Negative  ORT Risk Level calculation: Low Risk Risk of substance use disorder (SUD): Low Opioid Risk Tool - 12/12/17 0954      Personal History of Substance Abuse   Alcohol  Negative    Illegal Drugs  Negative    Rx Drugs  Negative      Age   Age between 17-45 years   No      Psychological Disease   Psychological Disease  Negative    Depression  Negative      Total Score   Opioid Risk Tool Scoring  0    Opioid Risk Interpretation  Low Risk      ORT Scoring interpretation table:  Score <3 = Low Risk for SUD  Score between 4-7 = Moderate Risk for SUD  Score >8 = High Risk for Opioid Abuse   Risk Mitigation Strategies:  Patient Counseling: Covered Patient-Prescriber Agreement (PPA): Present and active  Notification to other healthcare providers: Done  Pharmacologic Plan: No change in therapy, at this time.           Will hold prescriptions until patient brings in medication and will repeat a UDS  Laboratory Chemistry  Inflammation Markers (CRP: Acute Phase) (ESR: Chronic Phase) No results found for: CRP, ESRSEDRATE, LATICACIDVEN                       Rheumatology Markers No results found for: RF, ANA, LABURIC, URICUR, LYMEIGGIGMAB, LYMEABIGMQN, HLAB27                      Renal Function Markers Lab Results  Component Value Date   BUN 11 12/05/2017   CREATININE 0.64 (L) 12/05/2017   BCR 17 12/05/2017    GFRAA 127 12/05/2017   GFRNONAA 110 12/05/2017                             Hepatic Function Markers Lab Results  Component Value Date   AST 18 12/05/2017   ALT 26 12/05/2017   ALBUMIN 4.2 12/05/2017   ALKPHOS 71 12/05/2017   LIPASE 27 08/07/2017  Electrolytes Lab Results  Component Value Date   NA 137 12/05/2017   K 4.8 12/05/2017   CL 97 12/05/2017   CALCIUM 9.3 12/05/2017                        Neuropathy Markers Lab Results  Component Value Date   HIV Non Reactive 12/05/2017                        Bone Pathology Markers No results found for: [place], [id number], YQ8250IB7, CW8889VQ9, 25OHVITD1, 25OHVITD2, 25OHVITD3, TESTOFREE, TESTOSTERONE                       Coagulation Parameters Lab Results  Component Value Date   PLT 206 12/05/2017                        Cardiovascular Markers Lab Results  Component Value Date   CKTOTAL 135 04/07/2012   CKMB 0.7 04/07/2012   TROPONINI <0.03 01/01/2017   HGB 15.6 12/05/2017   HCT 45.2 12/05/2017                         CA Markers No results found for: CEA, CA125, LABCA2                      Note: Lab results reviewed.  Recent Diagnostic Imaging Results  DG Tibia/Fibula Left CLINICAL DATA:  Stump pain  EXAM: LEFT TIBIA AND FIBULA - 2 VIEW  COMPARISON:  None.  FINDINGS: Two views study shows sequelae of BKA. No gas within the soft tissues. No worrisome lytic or sclerotic bony abnormality.  IMPRESSION: Unremarkable postoperative appearance in this patient status post BKA.  Electronically Signed   By: Misty Stanley M.D.   On: 10/14/2017 14:17  Complexity Note: Imaging results reviewed. Results shared with Mr. Spiers, using Layman's terms.                         Meds   Current Outpatient Medications:  .  acetaminophen (TYLENOL) 500 MG tablet, Take 500 mg by mouth every 6 (six) hours as needed., Disp: , Rfl:  .  [START ON 12/13/2017] gabapentin (NEURONTIN) 600 MG tablet, 1200  mg  BID, Disp: 120 tablet, Rfl: 2 .  naproxen sodium (ALEVE) 220 MG tablet, Take 220 mg by mouth., Disp: , Rfl:  .  [START ON 02/11/2018] Oxycodone HCl 10 MG TABS, Take 1 tablet (10 mg total) by mouth every 6 (six) hours as needed., Disp: 120 tablet, Rfl: 0 .  tiZANidine (ZANAFLEX) 4 MG tablet, Take 4 mg by mouth 3 (three) times daily., Disp: , Rfl:  .  [START ON 01/12/2018] Oxycodone HCl 10 MG TABS, Take 1 tablet (10 mg total) by mouth every 6 (six) hours as needed. For chronic pain, Disp: 120 tablet, Rfl: 0 .  [START ON 12/13/2017] Oxycodone HCl 10 MG TABS, Take 1 tablet (10 mg total) by mouth every 6 (six) hours as needed., Disp: 120 tablet, Rfl: 0  ROS  Constitutional: Denies any fever or chills Gastrointestinal: No reported hemesis, hematochezia, vomiting, or acute GI distress Musculoskeletal: Denies any acute onset joint swelling, redness, loss of ROM, or weakness Neurological: No reported episodes of acute onset apraxia, aphasia, dysarthria, agnosia, amnesia, paralysis, loss of coordination, or loss of consciousness  Allergies  Mr. Senn is allergic  to tramadol.  PFSH  Drug: Mr. Head  reports that he does not use drugs. Alcohol:  reports that he does not drink alcohol. Tobacco:  reports that he has been smoking cigarettes. He has been smoking about 0.50 packs per day. He has quit using smokeless tobacco. Medical:  has a past medical history of Depression, BKA ([place]), and Panic attack. Surgical: Mr. Erway  has a past surgical history that includes Leg amputation; Hand surgery; and Leg Surgery. Family: family history includes Alzheimer's disease in his maternal grandmother; Cancer in his maternal grandfather, mother, and paternal grandmother; Diabetes in his father; Heart disease in his father; Hypertension in his father.  Constitutional Exam  General appearance: alert, cooperative, in no distress and morbidly obese Vitals:   12/12/17 0859  BP: (!) 144/69  Pulse: 94  Resp:  16  Temp: 98.3 F (36.8 C)  SpO2: 97%  Weight: (!) 326 lb (147.9 kg)  Height: 6' (1.829 m)         Eyes: PERLA Respiratory: No evidence of acute respiratory distress  Lumbar Spine Area Exam  Skin & Axial Inspection: No masses, redness, or swelling Alignment: Symmetrical Functional ROM: Unrestricted ROM       Stability: No instability detected Muscle Tone/Strength: Functionally intact. No obvious neuro-muscular anomalies detected. Sensory (Neurological): Unimpaired Palpation: No palpable anomalies       Provocative Tests: Hyperextension/rotation test: deferred today       Lumbar quadrant test (Kemp's test): deferred today       Lateral bending test: deferred today       Patrick's Maneuver: deferred today                   FABER test: deferred today                   S-I anterior distraction/compression test: deferred today         S-I lateral compression test: deferred today         S-I Thigh-thrust test: deferred today         S-I Gaenslen's test: deferred today          Gait & Posture Assessment  Ambulation: Unassisted Gait: Relatively normal for age and body habitus Posture: WNL   Lower Extremity Exam    Side: Right lower extremity  Side: Left lower extremity  Stability: No instability observed          Stability: No instability observed          Skin & Extremity Inspection: Skin color, temperature, and hair growth are WNL. No peripheral edema or cyanosis. No masses, redness, swelling, asymmetry, or associated skin lesions. No contractures.  Skin & Extremity Inspection: Below knee amputation (BKA) prosthesis worn  Functional ROM: Unrestricted ROM                  Functional ROM: Unrestricted ROM                  Muscle Tone/Strength: Functionally intact. No obvious neuro-muscular anomalies detected.  Muscle Tone/Strength: Functionally intact. No obvious neuro-muscular anomalies detected.  Sensory (Neurological): Unimpaired  Sensory (Neurological): Unimpaired  Palpation:  No palpable anomalies  Palpation: No palpable anomalies   Assessment  Primary Diagnosis & Pertinent Problem List: The primary encounter diagnosis was Lumbar spondylosis. Diagnoses of Chronic sacroiliac joint pain, Chronic bilateral low back pain with left-sided sciatica, Phantom limb pain (HCC), Chronic pain syndrome, and Long term current use of opiate analgesic were also pertinent to  this visit.  Status Diagnosis  Persistent Persistent Persistent 1. Lumbar spondylosis   2. Chronic sacroiliac joint pain   3. Chronic bilateral low back pain with left-sided sciatica   4. Phantom limb pain ([place])   5. Chronic pain syndrome   6. Long term current use of opiate analgesic     Problems updated and reviewed during this visit: Problem  Long Term Current Use of Opiate Analgesic   Plan of Care  Pharmacotherapy (Medications Ordered): Meds ordered this encounter  Medications  . gabapentin (NEURONTIN) 600 MG tablet    Sig: 1200 mg  BID    Dispense:  120 tablet    Refill:  2    Do not place this medication, or any other prescription from our practice, on "Automatic Refill". Patient may have prescription filled one day early if pharmacy is closed on scheduled refill date.    Order Specific Question:   Supervising Provider    Answer:   Milinda Pointer [phone]  . Oxycodone HCl 10 MG TABS    Sig: Take 1 tablet (10 mg total) by mouth every 6 (six) hours as needed.    Dispense:  120 tablet    Refill:  0    Do not place this medication on "Automatic Refill". Patient may have prescription filled one day early if pharmacy is closed on scheduled refill date. Do not fill until: 02/11/2018 To last until:03/13/2018    Order Specific Question:   Supervising Provider    Answer:   Milinda Pointer [phone]  . Oxycodone HCl 10 MG TABS    Sig: Take 1 tablet (10 mg total) by mouth every 6 (six) hours as needed. For chronic pain    Dispense:  120 tablet    Refill:  0    Do not place this medication on  "Automatic Refill". Patient may have prescription filled one day early if pharmacy is closed on scheduled refill date. Do no fill until:01/12/2018 To last until :02/11/2018    Order Specific Question:   Supervising Provider    Answer:   Milinda Pointer [phone]  . Oxycodone HCl 10 MG TABS    Sig: Take 1 tablet (10 mg total) by mouth every 6 (six) hours as needed.    Dispense:  120 tablet    Refill:  0    Do not place this medication on "Automatic Refill". Patient may have prescription filled one day early if pharmacy is closed on scheduled refill date. Do not fill until: 12/13/2017 To last until:01/12/2018    Order Specific Question:   Supervising Provider    Answer:   Milinda Pointer [phone]   New Prescriptions   No medications on file   Medications administered today: [name] had no medications administered during this visit. Lab-work, procedure(s), and/or referral(s): Orders Placed This Encounter  Procedures  . SACROILIAC JOINT INJECTION  . ToxASSURE Select 13 (MW), Urine   Imaging and/or referral(s): None  Interventional therapies: Planned, scheduled, and/or pending:   Bilateral sacroiliac nerve block without sedation    Provider-requested follow-up: Return for Procedure(NS), w/ Dr. Holley Raring, ([facility]).  Future Appointments  Date Time Provider [place]  12/14/2017  8:00 AM Volney American, [place] [facility]  01/03/2018  9:30 AM Gillis Santa, MD [facility] None  03/12/2018  9:00 AM Vevelyn Francois, NP [facility] None   Primary Care Physician: Volney American, PA-C Location: [facility] Note by: Vevelyn Francois NP Date: 12/12/2017; Time: 4:22 PM  Pain Score  Disclaimer: We use the NRS-11 scale. This is a self-reported, subjective measurement of pain severity with only modest accuracy. It is used primarily to identify changes within a particular patient. It must be understood that outpatient pain scales are  significantly less accurate that those used for research, where they can be applied under ideal controlled circumstances with minimal exposure to variables. In reality, the score is likely to be a combination of pain intensity and pain affect, where pain affect describes the degree of emotional arousal or changes in action readiness caused by the sensory experience of pain. Factors such as social and work situation, setting, emotional state, anxiety levels, expectation, and prior pain experience may influence pain perception and show large inter-individual differences that may also be affected by time variables.  Patient instructions provided during this appointment: Patient Instructions   ________________________________________________________________*____________________________________________________________________________________________  Preparing for your procedure (without sedation)  Instructions: . Oral Intake: Do not eat or drink anything for at least 3 hours prior to your procedure. . Transportation: Unless otherwise stated by your physician, you may drive yourself after the procedure. . Blood Pressure Medicine: Take your blood pressure medicine with a sip of water the morning of the procedure. . Blood thinners: Notify our staff if you are taking any blood thinners. Depending on which one you take, there will be specific instructions on how and when to stop it. . Diabetics on insulin: Notify the staff so that you can be scheduled 1st case in the morning. If your diabetes requires high dose insulin, take only  of your normal insulin dose the morning of the procedure and notify the staff that you have done so. . Preventing infections: Shower with an antibacterial soap the morning of your procedure.  . Build-up your immune system: Take 1000 mg of Vitamin C with every meal (3 times a day) the day prior to your procedure. Marland Kitchen Antibiotics: Inform the staff if you have a condition or reason  that requires you to take antibiotics before dental procedures. . Pregnancy: If you are pregnant, call and cancel the procedure. . Sickness: If you have a cold, fever, or any active infections, call and cancel the procedure. . Arrival: You must be in the facility at least 30 minutes prior to your scheduled procedure. . Children: Do not bring any children with you. . Dress appropriately: Bring dark clothing that you would not mind if they get stained. . Valuables: Do not bring any jewelry or valuables.  Procedure appointments are reserved for interventional treatments only. Marland Kitchen No Prescription Refills. . No medication changes will be discussed during procedure appointments. . No disability issues will be discussed.  Reasons to call and reschedule or cancel your procedure: (Following these recommendations will minimize the risk of a serious complication.) . Surgeries: Avoid having procedures within 2 weeks of any surgery. (Avoid for 2 weeks before or after any surgery). . Flu Shots: Avoid having procedures within 2 weeks of a flu shots or . (Avoid for 2 weeks before or after immunizations). . Barium: Avoid having a procedure within 7-10 days after having had a radiological study involving the use of radiological contrast. (Myelograms, Barium swallow or enema study). . Heart attacks: Avoid any elective procedures or surgeries for the initial 6 months after a "Myocardial Infarction" (Heart Attack). . Blood thinners: It is imperative that you stop these medications before procedures. Let us know if you if you take any blood thinner.  . Infection: Avoid procedures during or within two weeks of an infection (  including chest colds or gastrointestinal problems). Symptoms associated with infections include: Localized redness, fever, chills, night sweats or profuse sweating, burning sensation when voiding, cough, congestion, stuffiness, runny nose, sore throat, diarrhea, nausea, vomiting, cold or Flu symptoms,  recent or current infections. It is specially important if the infection is over the area that we intend to treat. Marland Kitchen Heart and lung problems: Symptoms that may suggest an active cardiopulmonary problem include: cough, chest pain, breathing difficulties or shortness of breath, dizziness, ankle swelling, uncontrolled high or unusually low blood pressure, and/or palpitations. If you are experiencing any of these symptoms, cancel your procedure and contact your primary care physician for an evaluation.  Remember:  Regular Business hours are:  Monday to Thursday 8:00 AM to 4:00 PM  Provider's Schedule: Milinda Pointer, MD:  Procedure days: Tuesday and Thursday 7:30 AM to 4:00 PM  Gillis Santa, MD:  Procedure days: Monday and Wednesday 7:30 AM to 4:00 PM ____________________________________________________________________________________________  Margretta Sidle pain medication bottle to office and Prescriptions will be given.___________________________  Medication Rules  Applies to: All patients receiving prescriptions (written or electronic).  Pharmacy of record: Pharmacy where electronic prescriptions will be sent. If written prescriptions are taken to a different pharmacy, please inform the nursing staff. The pharmacy listed in the electronic medical record should be the one where you would like electronic prescriptions to be sent.  Prescription refills: Only during scheduled appointments. Applies to both, written and electronic prescriptions.  NOTE: The following applies primarily to controlled substances (Opioid* Pain Medications).   Patient's responsibilities: 1. Pain Pills: Bring all pain pills to every appointment (except for procedure appointments). 2. Pill Bottles: Bring pills in original pharmacy bottle. Always bring newest bottle. Bring bottle, even if empty. 3. Medication refills: You are responsible for knowing and keeping track of what medications you need refilled. The day before  your appointment, write a list of all prescriptions that need to be refilled. Bring that list to your appointment and give it to the admitting nurse. Prescriptions will be written only during appointments. If you forget a medication, it will not be "Called in", "Faxed", or "electronically sent". You will need to get another appointment to get these prescribed. 4. Prescription Accuracy: You are responsible for carefully inspecting your prescriptions before leaving our office. Have the discharge nurse carefully go over each prescription with you, before taking them home. Make sure that your name is accurately spelled, that your address is correct. Check the name and dose of your medication to make sure it is accurate. Check the number of pills, and the written instructions to make sure they are clear and accurate. Make sure that you are given enough medication to last until your next medication refill appointment. 5. Taking Medication: Take medication as prescribed. Never take more pills than instructed. Never take medication more frequently than prescribed. Taking less pills or less frequently is permitted and encouraged, when it comes to controlled substances (written prescriptions).  6. Inform other Doctors: Always inform, all of your healthcare providers, of all the medications you take. 7. Pain Medication from other Providers: You are not allowed to accept any additional pain medication from any other Doctor or Healthcare provider. There are two exceptions to this rule. (see below) In the event that you require additional pain medication, you are responsible for notifying us, as stated below. 8. Medication Agreement: You are responsible for carefully reading and following our Medication Agreement. This must be signed before receiving any prescriptions from our practice. Safely store a  copy of your signed Agreement. Violations to the Agreement will result in no further prescriptions. (Additional copies of our  Medication Agreement are available upon request.) 9. Laws, Rules, & Regulations: All patients are expected to follow all Federal and [employer], [employer], Rules, [employer]. Ignorance of the Laws does not constitute a valid excuse. The use of any illegal substances is prohibited. 10. Adopted CDC guidelines & recommendations: Target dosing levels will be at or below 60 MME/day. Use of benzodiazepines** is not recommended.  Exceptions: There are only two exceptions to the rule of not receiving pain medications from other Healthcare Providers. 1. Exception #1 (Emergencies): In the event of an emergency (i.e.: accident requiring emergency care), you are allowed to receive additional pain medication. However, you are responsible for: As soon as you are able, call our office [phone], at any time of the day or night, and leave a message stating your name, the date and nature of the emergency, and the name and dose of the medication prescribed. In the event that your call is answered by a member of our staff, make sure to document and save the date, time, and the name of the person that took your information.  2. Exception #2 (Planned Surgery): In the event that you are scheduled by another doctor or dentist to have any type of surgery or procedure, you are allowed (for a period no longer than 30 days), to receive additional pain medication, for the acute post-op pain. However, in this case, you are responsible for picking up a copy of our "Post-op Pain Management for Surgeons" handout, and giving it to your surgeon or dentist. This document is available at our office, and does not require an appointment to obtain it. Simply go to our office during business hours (Monday-Thursday from 8:00 AM to 4:00 PM) (Friday 8:00 AM to 12:00 Noon) or if you have a scheduled appointment with [place], prior to your surgery, and ask for it by name. In addition, you will need to provide [place] with your name, name of your surgeon,  type of surgery, and date of procedure or surgery.  *Opioid medications include: morphine, codeine, oxycodone, oxymorphone, hydrocodone, hydromorphone, meperidine, tramadol, tapentadol, buprenorphine, fentanyl, methadone. **Benzodiazepine medications include: diazepam (Valium), alprazolam (Xanax), clonazepam (Klonopine), lorazepam (Ativan), clorazepate (Tranxene), chlordiazepoxide (Librium), estazolam (Prosom), oxazepam (Serax), temazepam (Restoril), triazolam (Halcion) (Last updated: 06/08/2017) ____________________________________________________________________________________________    BMI Assessment: Estimated body mass index is 44.21 kg/m as calculated from the following:   Height as of this encounter: 6' (1.829 m).   Weight as of this encounter: 326 lb (147.9 kg).  BMI interpretation table: BMI level Category Range association with higher incidence of chronic pain  <18 kg/m2 Underweight   18.5-24.9 kg/m2 Ideal body weight   25-29.9 kg/m2 Overweight Increased incidence by 20%  30-34.9 kg/m2 Obese (Class I) Increased incidence by 68%  35-39.9 kg/m2 Severe obesity (Class II) Increased incidence by 136%  >40 kg/m2 Extreme obesity (Class III) Increased incidence by 254%   Patient's current BMI Ideal Body weight  Body mass index is 44.21 kg/m. Ideal body weight: 77.6 kg (171 lb 1.2 oz) Adjusted ideal body weight: 105.7 kg (233 lb 0.7 oz)   BMI Readings from Last 4 Encounters:  12/12/17 44.21 kg/m  12/05/17 47.02 kg/m  10/14/17 43.40 kg/m  09/20/17 44.08 kg/m   Wt Readings from Last 4 Encounters:  12/12/17 (!) 326 lb (147.9 kg)  12/05/17 (!) 337 lb 1.6 oz (152.9 kg)  10/14/17 (!) 320 lb (145.2  kg)  09/20/17 (!) 325 lb (147.4 kg)  Sacroiliac (SI) Joint Injection Patient Information  Description: The sacroiliac joint connects the scrum (very low back and tailbone) to the ilium (a pelvic bone which also forms half of the hip joint).  Normally this joint experiences very  little motion.  When this joint becomes inflamed or unstable low back and or hip and pelvis pain may result.  Injection of this joint with local anesthetics (numbing medicines) and steroids can provide diagnostic information and reduce pain.  This injection is performed with the aid of x-ray guidance into the tailbone area while you are lying on your stomach.   You may experience an electrical sensation down the leg while this is being done.  You may also experience numbness.  We also may ask if we are reproducing your normal pain during the injection.  Conditions which may be treated SI injection:   Low back, buttock, hip or leg pain  Preparation for the Injection:  3. Do not eat any solid food or dairy products within 8 hours of your appointment.  4. You may drink clear liquids up to 3 hours before appointment.  Clear liquids include water, black coffee, juice or soda.  No milk or cream please. 5. You may take your regular medications, including pain medications with a sip of water before your appointment.  Diabetics should hold regular insulin (if take separately) and take 1/2 normal NPH dose the morning of the procedure.  Carry some sugar containing items with you to your appointment. 6. A driver must accompany you and be prepared to drive you home after your procedure. 7. Bring all of your current medications with you. 8. An IV may be inserted and sedation may be given at the discretion of the physician. 9. A blood pressure cuff, EKG and other monitors will often be applied during the procedure.  Some patients may need to have extra oxygen administered for a short period.  10. You will be asked to provide medical information, including your allergies, prior to the procedure.  We must know immediately if you are taking blood thinners (like Coumadin/Warfarin) or if you are allergic to IV iodine contrast (dye).  We must know if you could possible be pregnant.  Possible side  effects:   Bleeding from needle site  Infection (rare, may require surgery)  Nerve injury (rare)  Numbness & tingling (temporary)  A brief convulsion or seizure  Light-headedness (temporary)  Pain at injection site (several days)  Decreased blood pressure (temporary)  Weakness in the leg (temporary)   Call if you experience:   New onset weakness or numbness of an extremity below the injection site that last more than 8 hours.  Hives or difficulty breathing ( go to the emergency room)  Inflammation or drainage at the injection site  Any new symptoms which are concerning to you  Please note:  Although the local anesthetic injected can often make your back/ hip/ buttock/ leg feel good for several hours after the injections, the pain will likely return.  It takes 3-7 days for steroids to work in the sacroiliac area.  You may not notice any pain relief for at least that one week.  If effective, we will often do a series of three injections spaced 3-6 weeks apart to maximally decrease your pain.  After the initial series, we generally will wait some months before a repeat injection of the same type.  If you have any questions, please call [phone]  [facility]

## 2017-12-13 ENCOUNTER — Telehealth: Payer: Self-pay

## 2017-12-13 NOTE — Telephone Encounter (Signed)
Copied from [place] [phone]. Topic: Quick Communication - Lab Results >> Dec 12, 2017 12:01 PM Tye [place] wrote: Contact pt to give labs; pt was notified to contact office back

## 2017-12-13 NOTE — Telephone Encounter (Signed)
Results were given when patient walked in to clinic.

## 2017-12-13 NOTE — Telephone Encounter (Signed)
Results were given via phone by Gerda Diss, [place] 12/12/2017. Routing to her to verify.

## 2017-12-14 ENCOUNTER — Ambulatory Visit: Payer: Medicaid Other | Admitting: Family Medicine

## 2017-12-16 LAB — TOXASSURE SELECT 13 (MW), URINE

## 2017-12-25 ENCOUNTER — Encounter: Payer: Self-pay | Admitting: *Deleted

## 2018-01-03 ENCOUNTER — Ambulatory Visit: Payer: Medicaid Other | Admitting: Student in an Organized Health Care Education/Training Program

## 2018-03-12 ENCOUNTER — Other Ambulatory Visit: Payer: Self-pay

## 2018-03-12 ENCOUNTER — Encounter: Payer: Self-pay | Admitting: Nurse Practitioner

## 2018-03-12 ENCOUNTER — Ambulatory Visit: Payer: Medicaid Other | Attending: Nurse Practitioner | Admitting: Nurse Practitioner

## 2018-03-12 VITALS — BP 126/113 | HR 101 | Temp 98.1°F | Ht 72.0 in | Wt 328.0 lb

## 2018-03-12 DIAGNOSIS — G8929 Other chronic pain: Secondary | ICD-10-CM

## 2018-03-12 DIAGNOSIS — F17210 Nicotine dependence, cigarettes, uncomplicated: Secondary | ICD-10-CM | POA: Insufficient documentation

## 2018-03-12 DIAGNOSIS — M79672 Pain in left foot: Secondary | ICD-10-CM | POA: Diagnosis not present

## 2018-03-12 DIAGNOSIS — M5442 Lumbago with sciatica, left side: Secondary | ICD-10-CM | POA: Diagnosis not present

## 2018-03-12 DIAGNOSIS — G894 Chronic pain syndrome: Secondary | ICD-10-CM

## 2018-03-12 DIAGNOSIS — Z888 Allergy status to other drugs, medicaments and biological substances status: Secondary | ICD-10-CM | POA: Diagnosis not present

## 2018-03-12 DIAGNOSIS — M533 Sacrococcygeal disorders, not elsewhere classified: Secondary | ICD-10-CM | POA: Diagnosis not present

## 2018-03-12 DIAGNOSIS — F329 Major depressive disorder, single episode, unspecified: Secondary | ICD-10-CM | POA: Diagnosis not present

## 2018-03-12 DIAGNOSIS — Z79899 Other long term (current) drug therapy: Secondary | ICD-10-CM | POA: Diagnosis not present

## 2018-03-12 DIAGNOSIS — Z79891 Long term (current) use of opiate analgesic: Secondary | ICD-10-CM

## 2018-03-12 DIAGNOSIS — R030 Elevated blood-pressure reading, without diagnosis of hypertension: Secondary | ICD-10-CM | POA: Insufficient documentation

## 2018-03-12 DIAGNOSIS — M47816 Spondylosis without myelopathy or radiculopathy, lumbar region: Secondary | ICD-10-CM

## 2018-03-12 DIAGNOSIS — Z9889 Other specified postprocedural states: Secondary | ICD-10-CM | POA: Insufficient documentation

## 2018-03-12 DIAGNOSIS — M25552 Pain in left hip: Secondary | ICD-10-CM | POA: Diagnosis not present

## 2018-03-12 DIAGNOSIS — F410 Panic disorder [episodic paroxysmal anxiety] without agoraphobia: Secondary | ICD-10-CM | POA: Insufficient documentation

## 2018-03-12 DIAGNOSIS — G546 Phantom limb syndrome with pain: Secondary | ICD-10-CM | POA: Diagnosis not present

## 2018-03-12 DIAGNOSIS — Z885 Allergy status to narcotic agent status: Secondary | ICD-10-CM | POA: Insufficient documentation

## 2018-03-12 DIAGNOSIS — Z8249 Family history of ischemic heart disease and other diseases of the circulatory system: Secondary | ICD-10-CM | POA: Diagnosis not present

## 2018-03-12 DIAGNOSIS — Z5181 Encounter for therapeutic drug level monitoring: Secondary | ICD-10-CM | POA: Insufficient documentation

## 2018-03-12 MED ORDER — OXYCODONE HCL 10 MG PO TABS: 10.0000 mg | ORAL_TABLET | Freq: Four times a day (QID) | ORAL | 0 refills | Status: DC | PRN

## 2018-03-12 MED ORDER — GABAPENTIN 600 MG PO TABS: ORAL_TABLET | ORAL | 2 refills | Status: DC

## 2018-03-12 MED ORDER — TIZANIDINE HCL 4 MG PO TABS: 4.0000 mg | ORAL_TABLET | Freq: Three times a day (TID) | ORAL | 2 refills | Status: DC

## 2018-03-12 NOTE — Progress Notes (Signed)
Patient's Name: [name] Day.  MRN: [id number]  Referring Provider: Volney American,*  DOB: [birth year]  PCP: Volney American, PA-C  DOS: 03/12/2018  Note by: Vevelyn Francois NP  Service setting: Ambulatory outpatient  Specialty: Interventional Pain Management  Location: [facility] (AMB) Pain Management Facility    Patient type: Established    Primary Reason(s) for Visit: Encounter for prescription drug management. (Level of risk: moderate)  CC: Back Pain  HPI  [name] Day is a 57 y.o. year old, male patient, who comes today for a medication management evaluation. He has Depression; Chronic left hip pain; S/P BKA (below knee amputation) unilateral, left ([place]); Chronic pain syndrome; 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[place]); Anxiety; Elevated blood-pressure reading without diagnosis of hypertension; Pain in joint involving ankle and foot; Late complications of amputation stump ([place]); Nonunion of fracture; Obesity; Phantom limb pain ([place]); Primary localized osteoarthrosis of ankle and foot; Tobacco abuse; Traumatic arthropathy of ankle and foot; Lumbar spondylosis; Chronic sacroiliac joint pain; Chronic bilateral low back pain with left-sided sciatica; and Long term current use of opiate analgesic on their problem list. His primarily concern today is the Back Pain  Pain Assessment: Location: Right, Left, Lower Back Radiating: pain radiaites down legs more on the left side Onset: More than a month ago Duration: Chronic pain Quality: Pounding, Aching, Throbbing, Pressure, Constant Severity: 4 /10 (subjective, self-reported pain score)  Note: Reported level is compatible with observation.                          Effect on ADL: limits my daily activities Timing: Constant Modifying factors: medications, elevates leg, ice and heating pad BP: (!) 126/113  HR: (!) 101  [name] Day was last scheduled for an appointment on 12/12/2017 for medication management. During  today's appointment we reviewed [name] Day chronic pain status, as well as his outpatient medication regimen. He suffered a fall on last Thursday. He fell coming down the steps. He was not evaluated for this but states used ice, elevation and rest. He did use a few more of his Oxycodone. He admits that he is still sore but denies any new concerns.    The patient  reports that he does not use drugs. His body mass index is 44.48 kg/m.  Further details on both, my assessment(s), as well as the proposed treatment plan, please see below.  Controlled Substance Pharmacotherapy Assessment REMS (Risk Evaluation and Mitigation Strategy)  Analgesic:Oxycodone 10 mg QID prn MME/day:60 MME [name] Fischer, RN  03/12/2018  9:11 AM  Sign at close encounter Nursing Pain Medication Assessment:  Safety precautions to be maintained throughout the outpatient stay will include: orient to surroundings, keep bed in low position, maintain call bell within reach at all times, provide assistance with transfer out of bed and ambulation.  Medication Inspection Compliance: Pill count conducted under aseptic conditions, in front of the patient. Neither the pills nor the bottle was removed from the patient's sight at any time. Once count was completed pills were immediately returned to the patient in their original bottle.  Medication: Oxycodone IR Pill/Patch Count: 3 of 120 pills remain Pill/Patch Appearance: Markings consistent with prescribed medication Bottle Appearance: Standard pharmacy container. Clearly labeled. Filled Date: 73 / 3 / 2019 Last Medication intake:  Today   Pharmacokinetics: Liberation and absorption (onset of action): WNL Distribution (time to peak effect): WNL Metabolism and excretion (duration of action): WNL  Pharmacodynamics: Desired effects: Analgesia: [name] Day reports >50% benefit. Functional ability: Patient reports that medication allows him to accomplish basic  ADLs Clinically meaningful improvement in function (CMIF): Sustained CMIF goals met Perceived effectiveness: Described as relatively effective, allowing for increase in activities of daily living (ADL) Undesirable effects: Side-effects or Adverse reactions: None reported Monitoring: [place]MP: Online review of the past 65-monthperiod conducted. Compliant with practice rules and regulations Last UDS on record: Summary  Date Value Ref Range Status  12/12/2017 FINAL  Final    Comment:    ==================================================================== TOXASSURE SELECT 13 (MW) ==================================================================== Test                             Result       Flag       Units Drug Present and Declared for Prescription Verification   Oxycodone                      2649         EXPECTED   ng/mg creat   Oxymorphone                    2342         EXPECTED   ng/mg creat   Noroxycodone                   1867         EXPECTED   ng/mg creat   Noroxymorphone                 370          EXPECTED   ng/mg creat    Sources of oxycodone are scheduled prescription medications.    Oxymorphone, noroxycodone, and noroxymorphone are expected    metabolites of oxycodone. Oxymorphone is also available as a    scheduled prescription medication. Drug Present not Declared for Prescription Verification   Alcohol, Ethyl                 0.026        UNEXPECTED g/dL    Sources of ethyl alcohol include alcoholic beverages or as a    fermentation product of glucose; glucose is present in this    specimen.  Interpret result with caution, as the presence of    ethyl alcohol is likely due, at least in part, to fermentation of    glucose. ==================================================================== Test                      Result    Flag   Units      Ref Range   Creatinine              76               mg/dL       >=20 ==================================================================== Declared Medications:  The flagging and interpretation on this report are based on the  following declared medications.  Unexpected results may arise from  inaccuracies in the declared medications.  **Note: The testing scope of this panel includes these medications:  Oxycodone  **Note: The testing scope of this panel does not include following  reported medications:  Acetaminophen  Gabapentin  Naproxen  Tizanidine ==================================================================== For clinical consultation, please call ([phone] ====================================================================    UDS interpretation: Non-Compliant         ETOH use He admits that he uses  it on occasions. He admits that he had 2 beers over Thanksgiving.  Medication Assessment Form: Reviewed. Patient indicates being compliant with therapy Treatment compliance: Non-compliant. Steps taken to remind the patient of the seriousness of adequate therapy compliance Risk Assessment Profile: Aberrant behavior: See prior evaluations. None observed or detected today Comorbid factors increasing risk of overdose: caucasian and history of alcoholism Opioid risk tool (ORT) (Total Score): 0 Personal History of Substance Abuse (SUD-Substance use disorder):  Alcohol: Negative  Illegal Drugs: Negative  Rx Drugs: Negative  ORT Risk Level calculation: Low Risk Risk of substance use disorder (SUD): Moderate Opioid Risk Tool - 03/12/18 0918      Family History of Substance Abuse   Alcohol  Negative    Illegal Drugs  Negative    Rx Drugs  Negative      Personal History of Substance Abuse   Alcohol  Negative    Illegal Drugs  Negative    Rx Drugs  Negative      Age   Age between 71-45 years   No      History of Preadolescent Sexual Abuse   History of Preadolescent Sexual Abuse  Negative or Male      Psychological Disease    Psychological Disease  Negative    Depression  Negative      Total Score   Opioid Risk Tool Scoring  0    Opioid Risk Interpretation  Low Risk      ORT Scoring interpretation table:  Score <3 = Low Risk for SUD  Score between 4-7 = Moderate Risk for SUD  Score >8 = High Risk for Opioid Abuse   Risk Mitigation Strategies:  Patient Counseling: Covered Patient-Prescriber Agreement (PPA): Present and active  Notification to other healthcare providers: Done  Pharmacologic Plan: No change in therapy, at this time.             Laboratory Chemistry  Inflammation Markers (CRP: Acute Phase) (ESR: Chronic Phase) No results found for: CRP, ESRSEDRATE, LATICACIDVEN                       Rheumatology Markers No results found for: RF, ANA, LABURIC, URICUR, LYMEIGGIGMAB, LYMEABIGMQN, HLAB27                      Renal Function Markers Lab Results  Component Value Date   BUN 11 12/05/2017   CREATININE 0.64 (L) 12/05/2017   BCR 17 12/05/2017   GFRAA 127 12/05/2017   GFRNONAA 110 12/05/2017                             Hepatic Function Markers Lab Results  Component Value Date   AST 18 12/05/2017   ALT 26 12/05/2017   ALBUMIN 4.2 12/05/2017   ALKPHOS 71 12/05/2017   LIPASE 27 08/07/2017                        Electrolytes Lab Results  Component Value Date   NA 137 12/05/2017   K 4.8 12/05/2017   CL 97 12/05/2017   CALCIUM 9.3 12/05/2017                        Neuropathy Markers Lab Results  Component Value Date   HIV Non Reactive 12/05/2017  CNS Tests No results found for: COLORCSF, APPEARCSF, RBCCOUNTCSF, WBCCSF, POLYSCSF, LYMPHSCSF, EOSCSF, PROTEINCSF, GLUCCSF, JCVIRUS, CSFOLI, IGGCSF                      Bone Pathology Markers No results found for: VD25OH, [id number], [id number], [id number], 25OHVITD1, 25OHVITD2, 25OHVITD3, TESTOFREE, TESTOSTERONE                       Coagulation Parameters Lab Results  Component Value Date   PLT 206  12/05/2017                        Cardiovascular Markers Lab Results  Component Value Date   CKTOTAL 135 04/07/2012   CKMB 0.7 04/07/2012   TROPONINI <0.03 01/01/2017   HGB 15.6 12/05/2017   HCT 45.2 12/05/2017                         CA Markers No results found for: CEA, CA125, LABCA2                      Note: Lab results reviewed.  Recent Diagnostic Imaging Results  DG Tibia/Fibula Left CLINICAL DATA:  Stump pain  EXAM: LEFT TIBIA AND FIBULA - 2 VIEW  COMPARISON:  None.  FINDINGS: Two views study shows sequelae of BKA. No gas within the soft tissues. No worrisome lytic or sclerotic bony abnormality.  IMPRESSION: Unremarkable postoperative appearance in this patient status post BKA.  Electronically Signed   By: Misty Stanley M.D.   On: 10/14/2017 14:17  Complexity Note: Imaging results reviewed. Results shared with [name] Day, using Layman's terms.                         Meds   Current Outpatient Medications:  .  acetaminophen (TYLENOL) 500 MG tablet, Take 500 mg by mouth every 6 (six) hours as needed., Disp: , Rfl:  .  gabapentin (NEURONTIN) 600 MG tablet, 1200 mg  BID, Disp: 120 tablet, Rfl: 2 .  naproxen sodium (ALEVE) 220 MG tablet, Take 220 mg by mouth., Disp: , Rfl:  .  [START ON 05/12/2018] Oxycodone HCl 10 MG TABS, Take 1 tablet (10 mg total) by mouth every 6 (six) hours as needed., Disp: 120 tablet, Rfl: 0 .  tiZANidine (ZANAFLEX) 4 MG tablet, Take 1 tablet (4 mg total) by mouth 3 (three) times daily., Disp: 90 tablet, Rfl: 2 .  [START ON 04/12/2018] Oxycodone HCl 10 MG TABS, Take 1 tablet (10 mg total) by mouth every 6 (six) hours as needed. For chronic pain, Disp: 120 tablet, Rfl: 0 .  [START ON 03/13/2018] Oxycodone HCl 10 MG TABS, Take 1 tablet (10 mg total) by mouth every 6 (six) hours as needed., Disp: 120 tablet, Rfl: 0  ROS  Constitutional: Denies any fever or chills Gastrointestinal: No reported hemesis, hematochezia, vomiting, or acute GI  distress Musculoskeletal: Denies any acute onset joint swelling, redness, loss of ROM, or weakness Neurological: No reported episodes of acute onset apraxia, aphasia, dysarthria, agnosia, amnesia, paralysis, loss of coordination, or loss of consciousness  Allergies  [name] Day is allergic to tramadol.  PFSH  Drug: [name] Day  reports that he does not use drugs. Alcohol:  reports that he does not drink alcohol. Tobacco:  reports that he has been smoking cigarettes. He has been smoking about 0.50 packs per day. He has quit  using smokeless tobacco. Medical:  has a past medical history of Depression, BKA ([place]), and Panic attack. Surgical: [name] Day  has a past surgical history that includes Leg amputation; Hand surgery; and Leg Surgery. Family: family history includes Alzheimer's disease in his maternal grandmother; Cancer in his maternal grandfather, mother, and paternal grandmother; Diabetes in his father; Heart disease in his father; Hypertension in his father.  Constitutional Exam  General appearance: Well nourished, well developed, and well hydrated. In no apparent acute distress Vitals:   03/12/18 0911  BP: (!) 126/113  Pulse: (!) 101  Temp: 98.1 F (36.7 C)  SpO2: 97%  Weight: (!) 328 lb (148.8 kg)  Height: 6' (1.829 m)   Psych/Mental status: Alert, oriented x 3 (person, place, & time)       Eyes: PERLA Respiratory: No evidence of acute respiratory distress  Lumbar Spine Area Exam  Skin & Axial Inspection: No masses, redness, or swelling Alignment: Symmetrical Functional ROM: Unrestricted ROM       Stability: No instability detected Muscle Tone/Strength: Functionally intact. No obvious neuro-muscular anomalies detected. Sensory (Neurological): Unimpaired Palpation: No palpable anomalies         Gait & Posture Assessment  Ambulation: Limited Gait: Limited. Using assistive device to ambulate Posture: WNL   Lower Extremity Exam    Side: Right lower extremity   Side: Left lower extremity  Stability: No instability observed          Stability: No instability observed          Skin & Extremity Inspection: Skin color, temperature, and hair growth are WNL. No peripheral edema or cyanosis. No masses, redness, swelling, asymmetry, or associated skin lesions. No contractures.  Skin & Extremity Inspection: Above knee amputation (AKA)  Functional ROM: Unrestricted ROM                    Muscle Tone/Strength: Functionally intact. No obvious neuro-muscular anomalies detected.    Sensory (Neurological): Unimpaired              Palpation: No palpable anomalies     Assessment  Primary Diagnosis & Pertinent Problem List: The primary encounter diagnosis was Lumbar spondylosis. Diagnoses of Chronic sacroiliac joint pain, Chronic bilateral low back pain with left-sided sciatica, Long term current use of opiate analgesic, 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[place]), and Chronic pain syndrome were also pertinent to this visit.  Status Diagnosis  Controlled Controlled Controlled 1. Lumbar spondylosis   2. Chronic sacroiliac joint pain   3. Chronic bilateral low back pain with left-sided sciatica   4. Long term current use of opiate analgesic   5. 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[place])   6. Chronic pain syndrome     Problems updated and reviewed during this visit: No problems updated. Plan of Care  Pharmacotherapy (Medications Ordered): Meds ordered this encounter  Medications  . gabapentin (NEURONTIN) 600 MG tablet    Sig: 1200 mg  BID    Dispense:  120 tablet    Refill:  2    Do not place this medication, or any other prescription from our practice, on "Automatic Refill". Patient may have prescription filled one day early if pharmacy is closed on scheduled refill date.    Order Specific Question:   Supervising Provider    Answer:   Milinda Pointer [phone]  . Oxycodone HCl 10 MG TABS    Sig: Take 1 tablet (10 mg total) by  mouth every 6 (six) hours  as needed.    Dispense:  120 tablet    Refill:  0    Do not place this medication, or any other prescription from our practice, on "Automatic Refill". Patient may have prescription filled one day early if pharmacy is closed on scheduled refill date.    Order Specific Question:   Supervising Provider    Answer:   Milinda Pointer [phone]  . tiZANidine (ZANAFLEX) 4 MG tablet    Sig: Take 1 tablet (4 mg total) by mouth 3 (three) times daily.    Dispense:  90 tablet    Refill:  2    Order Specific Question:   Supervising Provider    Answer:   Milinda Pointer [phone]  . Oxycodone HCl 10 MG TABS    Sig: Take 1 tablet (10 mg total) by mouth every 6 (six) hours as needed. For chronic pain    Dispense:  120 tablet    Refill:  0    Do not place this medication, or any other prescription from our practice, on "Automatic Refill". Patient may have prescription filled one day early if pharmacy is closed on scheduled refill date.    Order Specific Question:   Supervising Provider    Answer:   Gillis Santa [[id number]]  . Oxycodone HCl 10 MG TABS    Sig: Take 1 tablet (10 mg total) by mouth every 6 (six) hours as needed.    Dispense:  120 tablet    Refill:  0    Do not place this medication, or any other prescription from our practice, on "Automatic Refill". Patient may have prescription filled one day early if pharmacy is closed on scheduled refill date.    Order Specific Question:   Supervising Provider    Answer:   Milinda Pointer [[id number]]   New Prescriptions   No medications on file   Medications administered today: [name] had no medications administered during this visit. Lab-work, procedure(s), and/or referral(s): No orders of the defined types were placed in this encounter.  Imaging and/or referral(s): None  Interventional therapies: Planned, scheduled, and/or pending:   Not at this time. Encourage patient to follow up with PCP.  Elevated BP and HR    Provider-requested follow-up: Return in about 3 months (around 06/11/2018) for MedMgmt.  Future Appointments  Date Time Provider Anthony  06/04/2018  8:30 AM Vevelyn Francois, NP [facility] None   Primary Care Physician: Volney American, PA-C Location: [facility] Note by: Vevelyn Francois NP Date: 03/12/2018; Time: 10:04 AM  Pain Score Disclaimer: We use the NRS-11 scale. This is a self-reported, subjective measurement of pain severity with only modest accuracy. It is used primarily to identify changes within a particular patient. It must be understood that outpatient pain scales are significantly less accurate that those used for research, where they can be applied under ideal controlled circumstances with minimal exposure to variables. In reality, the score is likely to be a combination of pain intensity and pain affect, where pain affect describes the degree of emotional arousal or changes in action readiness caused by the sensory experience of pain. Factors such as social and work situation, setting, emotional state, anxiety levels, expectation, and prior pain experience may influence pain perception and show large inter-individual differences that may also be affected by time variables.  Patient instructions provided during this appointment: Patient Instructions   ____________________________________________________________________________________________  Medication Rules  Purpose: To inform patients, and their family members, of our  rules and regulations.  Applies to: All patients receiving prescriptions (written or electronic).  Pharmacy of record: Pharmacy where electronic prescriptions will be sent. If written prescriptions are taken to a different pharmacy, please inform the nursing staff. The pharmacy listed in the electronic medical record should be the one where you would like electronic prescriptions to be  sent.  Electronic prescriptions: In compliance with the [place] (STOP) Act of 2017 (Session Lanny Cramp [phone]), effective April 11, 2018, all controlled substances must be electronically prescribed. Calling prescriptions to the pharmacy will cease to exist.  Prescription refills: Only during scheduled appointments. Applies to all prescriptions.  NOTE: The following applies primarily to controlled substances (Opioid* Pain Medications).   Patient's responsibilities: 1. Pain Pills: Bring all pain pills to every appointment (except for procedure appointments). 2. Pill Bottles: Bring pills in original pharmacy bottle. Always bring the newest bottle. Bring bottle, even if empty. 3. Medication refills: You are responsible for knowing and keeping track of what medications you take and those you need refilled. The day before your appointment: write a list of all prescriptions that need to be refilled. The day of the appointment: give the list to the admitting nurse. Prescriptions will be written only during appointments. If you forget a medication: it will not be "Called in", "Faxed", or "electronically sent". You will need to get another appointment to get these prescribed. No early refills. Do not call asking to have your prescription filled early. 4. Prescription Accuracy: You are responsible for carefully inspecting your prescriptions before leaving our office. Have the discharge nurse carefully go over each prescription with you, before taking them home. Make sure that your name is accurately spelled, that your address is correct. Check the name and dose of your medication to make sure it is accurate. Check the number of pills, and the written instructions to make sure they are clear and accurate. Make sure that you are given enough medication to last until your next medication refill appointment. 5. Taking Medication: Take medication as prescribed. When it  comes to controlled substances, taking less pills or less frequently than prescribed is permitted and encouraged. Never take more pills than instructed. Never take medication more frequently than prescribed.  6. Inform other Doctors: Always inform, all of your healthcare providers, of all the medications you take. 7. Pain Medication from other Providers: You are not allowed to accept any additional pain medication from any other Doctor or Healthcare provider. There are two exceptions to this rule. (see below) In the event that you require additional pain medication, you are responsible for notifying us, as stated below. 8. Medication Agreement: You are responsible for carefully reading and following our Medication Agreement. This must be signed before receiving any prescriptions from our practice. Safely store a copy of your signed Agreement. Violations to the Agreement will result in no further prescriptions. (Additional copies of our Medication Agreement are available upon request.) 9. Laws, Rules, & Regulations: All patients are expected to follow all Federal and [employer], [employer], Rules, [employer]. Ignorance of the Laws does not constitute a valid excuse. The use of any illegal substances is prohibited. 10. Adopted CDC guidelines & recommendations: Target dosing levels will be at or below 60 MME/day. Use of benzodiazepines** is not recommended.  Exceptions: There are only two exceptions to the rule of not receiving pain medications from other Healthcare Providers. 1. Exception #1 (Emergencies): In the event of an emergency (i.e.: accident requiring emergency care), you  are allowed to receive additional pain medication. However, you are responsible for: As soon as you are able, call our office [phone], at any time of the day or night, and leave a message stating your name, the date and nature of the emergency, and the name and dose of the medication prescribed. In the event that your call  is answered by a member of our staff, make sure to document and save the date, time, and the name of the person that took your information.  2. Exception #2 (Planned Surgery): In the event that you are scheduled by another doctor or dentist to have any type of surgery or procedure, you are allowed (for a period no longer than 30 days), to receive additional pain medication, for the acute post-op pain. However, in this case, you are responsible for picking up a copy of our "Post-op Pain Management for Surgeons" handout, and giving it to your surgeon or dentist. This document is available at our office, and does not require an appointment to obtain it. Simply go to our office during business hours (Monday-Thursday from 8:00 AM to 4:00 PM) (Friday 8:00 AM to 12:00 Noon) or if you have a scheduled appointment with [place], prior to your surgery, and ask for it by name. In addition, you will need to provide [place] with your name, name of your surgeon, type of surgery, and date of procedure or surgery.  *Opioid medications include: morphine, codeine, oxycodone, oxymorphone, hydrocodone, hydromorphone, meperidine, tramadol, tapentadol, buprenorphine, fentanyl, methadone. **Benzodiazepine medications include: diazepam (Valium), alprazolam (Xanax), clonazepam (Klonopine), lorazepam (Ativan), clorazepate (Tranxene), chlordiazepoxide (Librium), estazolam (Prosom), oxazepam (Serax), temazepam (Restoril), triazolam (Halcion) (Last updated: 06/08/2017) ____________________________________________________________________________________________   BMI Assessment: Estimated body mass index is 44.21 kg/m as calculated from the following:   Height as of 12/12/17: 6' (1.829 m).   Weight as of 12/12/17: 326 lb (147.9 kg).  BMI interpretation table: BMI level Category Range association with higher incidence of chronic pain  <18 kg/m2 Underweight   18.5-24.9 kg/m2 Ideal body weight   25-29.9 kg/m2 Overweight Increased incidence by  20%  30-34.9 kg/m2 Obese (Class I) Increased incidence by 68%  35-39.9 kg/m2 Severe obesity (Class II) Increased incidence by 136%  >40 kg/m2 Extreme obesity (Class III) Increased incidence by 254%   Patient's current BMI Ideal Body weight  There is no height or weight on file to calculate BMI. Patient weight not recorded   BMI Readings from Last 4 Encounters:  12/12/17 44.21 kg/m  12/05/17 47.02 kg/m  10/14/17 43.40 kg/m  09/20/17 44.08 kg/m   Wt Readings from Last 4 Encounters:  12/12/17 (!) 326 lb (147.9 kg)  12/05/17 (!) 337 lb 1.6 oz (152.9 kg)  10/14/17 (!) 320 lb (145.2 kg)  09/20/17 (!) 325 lb (147.4 kg)

## 2018-03-12 NOTE — Patient Instructions (Signed)
____________________________________________________________________________________________  Medication Rules  Purpose: To inform patients, and their family members, of our rules and regulations.  Applies to: All patients receiving prescriptions (written or electronic).  Pharmacy of record: Pharmacy where electronic prescriptions will be sent. If written prescriptions are taken to a different pharmacy, please inform the nursing staff. The pharmacy listed in the electronic medical record should be the one where you would like electronic prescriptions to be sent.  Electronic prescriptions: In compliance with the [place] Strengthen Opioid Misuse Prevention (STOP) Act of 2017 (Session Law 2017-74/H243), effective April 11, 2018, all controlled substances must be electronically prescribed. Calling prescriptions to the pharmacy will cease to exist.  Prescription refills: Only during scheduled appointments. Applies to all prescriptions.  NOTE: The following applies primarily to controlled substances (Opioid* Pain Medications).   Patient's responsibilities: 1. Pain Pills: Bring all pain pills to every appointment (except for procedure appointments). 2. Pill Bottles: Bring pills in original pharmacy bottle. Always bring the newest bottle. Bring bottle, even if empty. 3. Medication refills: You are responsible for knowing and keeping track of what medications you take and those you need refilled. The day before your appointment: write a list of all prescriptions that need to be refilled. The day of the appointment: give the list to the admitting nurse. Prescriptions will be written only during appointments. If you forget a medication: it will not be "Called in", "Faxed", or "electronically sent". You will need to get another appointment to get these prescribed. No early refills. Do not call asking to have your prescription filled early. 4. Prescription Accuracy: You are responsible for  carefully inspecting your prescriptions before leaving our office. Have the discharge nurse carefully go over each prescription with you, before taking them home. Make sure that your name is accurately spelled, that your address is correct. Check the name and dose of your medication to make sure it is accurate. Check the number of pills, and the written instructions to make sure they are clear and accurate. Make sure that you are given enough medication to last until your next medication refill appointment. 5. Taking Medication: Take medication as prescribed. When it comes to controlled substances, taking less pills or less frequently than prescribed is permitted and encouraged. Never take more pills than instructed. Never take medication more frequently than prescribed.  6. Inform other Doctors: Always inform, all of your healthcare providers, of all the medications you take. 7. Pain Medication from other Providers: You are not allowed to accept any additional pain medication from any other Doctor or Healthcare provider. There are two exceptions to this rule. (see below) In the event that you require additional pain medication, you are responsible for notifying us, as stated below. 8. Medication Agreement: You are responsible for carefully reading and following our Medication Agreement. This must be signed before receiving any prescriptions from our practice. Safely store a copy of your signed Agreement. Violations to the Agreement will result in no further prescriptions. (Additional copies of our Medication Agreement are available upon request.) 9. Laws, Rules, & Regulations: All patients are expected to follow all Federal and State Laws, Statutes, Rules, & Regulations. Ignorance of the Laws does not constitute a valid excuse. The use of any illegal substances is prohibited. 10. Adopted CDC guidelines & recommendations: Target dosing levels will be at or below 60 MME/day. Use of benzodiazepines** is not  recommended.  Exceptions: There are only two exceptions to the rule of not receiving pain medications from other Healthcare Providers. 1.   Exception #1 (Emergencies): In the event of an emergency (i.e.: accident requiring emergency care), you are allowed to receive additional pain medication. However, you are responsible for: As soon as you are able, call our office [phone], at any time of the day or night, and leave a message stating your name, the date and nature of the emergency, and the name and dose of the medication prescribed. In the event that your call is answered by a member of our staff, make sure to document and save the date, time, and the name of the person that took your information.  2. Exception #2 (Planned Surgery): In the event that you are scheduled by another doctor or dentist to have any type of surgery or procedure, you are allowed (for a period no longer than 30 days), to receive additional pain medication, for the acute post-op pain. However, in this case, you are responsible for picking up a copy of our "Post-op Pain Management for Surgeons" handout, and giving it to your surgeon or dentist. This document is available at our office, and does not require an appointment to obtain it. Simply go to our office during business hours (Monday-Thursday from 8:00 AM to 4:00 PM) (Friday 8:00 AM to 12:00 Noon) or if you have a scheduled appointment with [place], prior to your surgery, and ask for it by name. In addition, you will need to provide [place] with your name, name of your surgeon, type of surgery, and date of procedure or surgery.  *Opioid medications include: morphine, codeine, oxycodone, oxymorphone, hydrocodone, hydromorphone, meperidine, tramadol, tapentadol, buprenorphine, fentanyl, methadone. **Benzodiazepine medications include: diazepam (Valium), alprazolam (Xanax), clonazepam (Klonopine), lorazepam (Ativan), clorazepate (Tranxene), chlordiazepoxide (Librium), estazolam (Prosom),  oxazepam (Serax), temazepam (Restoril), triazolam (Halcion) (Last updated: 06/08/2017) ____________________________________________________________________________________________   BMI Assessment: Estimated body mass index is 44.21 kg/m as calculated from the following:   Height as of 12/12/17: 6' (1.829 m).   Weight as of 12/12/17: 326 lb (147.9 kg).  BMI interpretation table: BMI level Category Range association with higher incidence of chronic pain  <18 kg/m2 Underweight   18.5-24.9 kg/m2 Ideal body weight   25-29.9 kg/m2 Overweight Increased incidence by 20%  30-34.9 kg/m2 Obese (Class I) Increased incidence by 68%  35-39.9 kg/m2 Severe obesity (Class II) Increased incidence by 136%  >40 kg/m2 Extreme obesity (Class III) Increased incidence by 254%   Patient's current BMI Ideal Body weight  There is no height or weight on file to calculate BMI. Patient weight not recorded   BMI Readings from Last 4 Encounters:  12/12/17 44.21 kg/m  12/05/17 47.02 kg/m  10/14/17 43.40 kg/m  09/20/17 44.08 kg/m   Wt Readings from Last 4 Encounters:  12/12/17 (!) 326 lb (147.9 kg)  12/05/17 (!) 337 lb 1.6 oz (152.9 kg)  10/14/17 (!) 320 lb (145.2 kg)  09/20/17 (!) 325 lb (147.4 kg)

## 2018-03-12 NOTE — Progress Notes (Signed)
Nursing Pain Medication Assessment:  Safety precautions to be maintained throughout the outpatient stay will include: orient to surroundings, keep bed in low position, maintain call bell within reach at all times, provide assistance with transfer out of bed and ambulation.  Medication Inspection Compliance: Pill count conducted under aseptic conditions, in front of the patient. Neither the pills nor the bottle was removed from the patient's sight at any time. Once count was completed pills were immediately returned to the patient in their original bottle.  Medication: Oxycodone IR Pill/Patch Count: 3 of 120 pills remain Pill/Patch Appearance: Markings consistent with prescribed medication Bottle Appearance: Standard pharmacy container. Clearly labeled. Filled Date: 66 / 3 / 2019 Last Medication intake:  Today

## 2018-06-04 ENCOUNTER — Other Ambulatory Visit: Payer: Self-pay

## 2018-06-04 ENCOUNTER — Encounter: Payer: Self-pay | Admitting: Nurse Practitioner

## 2018-06-04 ENCOUNTER — Ambulatory Visit: Payer: Medicaid Other | Attending: Nurse Practitioner | Admitting: Nurse Practitioner

## 2018-06-04 VITALS — BP 150/89 | HR 105 | Temp 98.0°F | Ht 72.0 in | Wt 328.0 lb

## 2018-06-04 DIAGNOSIS — G546 Phantom limb syndrome with pain: Secondary | ICD-10-CM | POA: Diagnosis not present

## 2018-06-04 DIAGNOSIS — M47816 Spondylosis without myelopathy or radiculopathy, lumbar region: Secondary | ICD-10-CM | POA: Insufficient documentation

## 2018-06-04 DIAGNOSIS — G894 Chronic pain syndrome: Secondary | ICD-10-CM | POA: Diagnosis not present

## 2018-06-04 DIAGNOSIS — G8929 Other chronic pain: Secondary | ICD-10-CM | POA: Insufficient documentation

## 2018-06-04 DIAGNOSIS — M5442 Lumbago with sciatica, left side: Secondary | ICD-10-CM | POA: Diagnosis not present

## 2018-06-04 DIAGNOSIS — Z79891 Long term (current) use of opiate analgesic: Secondary | ICD-10-CM | POA: Diagnosis not present

## 2018-06-04 DIAGNOSIS — M533 Sacrococcygeal disorders, not elsewhere classified: Secondary | ICD-10-CM | POA: Insufficient documentation

## 2018-06-04 MED ORDER — OXYCODONE HCL 10 MG PO TABS: 10.0000 mg | ORAL_TABLET | Freq: Four times a day (QID) | ORAL | 0 refills | Status: DC | PRN

## 2018-06-04 MED ORDER — GABAPENTIN 600 MG PO TABS: ORAL_TABLET | ORAL | 2 refills | Status: DC

## 2018-06-04 MED ORDER — TIZANIDINE HCL 4 MG PO TABS: 4.0000 mg | ORAL_TABLET | Freq: Three times a day (TID) | ORAL | 2 refills | Status: DC

## 2018-06-04 NOTE — Progress Notes (Signed)
Patient's Name: [name] Day.  MRN: [id number]  Referring Provider: Volney American,*  DOB: September 28, 1960  PCP: Volney American, PA-C  DOS: 06/04/2018  Note by: Dionisio David, NP  Service setting: Ambulatory outpatient  Specialty: Interventional Pain Management  Location: [facility] (AMB) Pain Management Facility    Patient type: Established   HPI  Reason for Visit: [name] Day. is a 58 y.o. year old, male patient, who comes today with a chief complaint of Back Pain Last Appointment: His last appointment at our practice was on 03/12/2018. I last saw him on 03/12/2018.  Pain Assessment: Today, [name] Day describes the severity of the Chronic pain as a 4 /10. He indicates the location/referral of the pain to be Back Left, Right, Lower/pain radiaties down left hip to upper leg. Onset was: More than a month ago. The quality of pain is described as Throbbing, Aching. Temporal description, or timing of pain is: Constant. Possible modifying factors: medications, rest and elevate, heating pad. [name] Day  height is 6' (1.829 m) and weight is 328 lb (148.8 kg) (abnormal). His temperature is 98 F (36.7 C). His blood pressure is 150/89 (abnormal) and his pulse is 105 (abnormal). His oxygen saturation is 93%.  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 He has more pain related to this. He is having to take one extra oxycodone at night to help with the pain. He is trying to take less on the days that he does not work. He is using the Aleve 2 tabs BID. He is aware that he needs to lose weight. He has tried to change his eating habits.   Controlled Substance Pharmacotherapy Assessment REMS (Risk Evaluation and Mitigation Strategy)  Analgesic:Oxycodone 10 mg QID prn MME/day:60 MME Chauncey Fischer, RN  06/04/2018  8:32 AM  Sign when Signing Visit Nursing Pain Medication Assessment:  Safety precautions to be maintained throughout the outpatient stay will include: orient to surroundings, keep  bed in low position, maintain call bell within reach at all times, provide assistance with transfer out of bed and ambulation.  Medication Inspection Compliance: Pill count conducted under aseptic conditions, in front of the patient. Neither the pills nor the bottle was removed from the patient's sight at any time. Once count was completed pills were immediately returned to the patient in their original bottle.  Medication: Oxycodone IR Pill/Patch Count: 26 of 120 pills remain Pill/Patch Appearance: Markings consistent with prescribed medication Bottle Appearance: Standard pharmacy container. Clearly labeled. Filled Date: 2 / 1 / 2020 Last Medication intake:  Today   Pharmacokinetics: Liberation and absorption (onset of action): WNL Distribution (time to peak effect): WNL Metabolism and excretion (duration of action): WNL         Pharmacodynamics: Desired effects: Analgesia: Mr. Comes reports >50% benefit. Functional ability: Patient reports that medication allows him to accomplish basic ADLs Clinically meaningful improvement in function (CMIF): Sustained CMIF goals met Perceived effectiveness: Described as relatively effective, allowing for increase in activities of daily living (ADL) Undesirable effects: Side-effects or Adverse reactions: None reported Monitoring: [place] PMP: Online review of the past 44-monthperiod conducted. Compliant with practice rules and regulations Last UDS on record: Summary  Date Value Ref Range Status  12/12/2017 FINAL  Final    Comment:    ==================================================================== TOXASSURE SSELECT 46(MW) ==================================================================== Test                             Result  Flag       Units Drug Present and Declared for Prescription Verification   Oxycodone                      2649         EXPECTED   ng/mg creat   Oxymorphone                    2342         EXPECTED   ng/mg  creat   Noroxycodone                   1867         EXPECTED   ng/mg creat   Noroxymorphone                 370          EXPECTED   ng/mg creat    Sources of oxycodone are scheduled prescription medications.    Oxymorphone, noroxycodone, and noroxymorphone are expected    metabolites of oxycodone. Oxymorphone is also available as a    scheduled prescription medication. Drug Present not Declared for Prescription Verification   Alcohol, Ethyl                 0.026        UNEXPECTED g/dL    Sources of ethyl alcohol include alcoholic beverages or as a    fermentation product of glucose; glucose is present in this    specimen.  Interpret result with caution, as the presence of    ethyl alcohol is likely due, at least in part, to fermentation of    glucose. ==================================================================== Test                      Result    Flag   Units      Ref Range   Creatinine              76               mg/dL      >=20 ==================================================================== Declared Medications:  The flagging and interpretation on this report are based on the  following declared medications.  Unexpected results may arise from  inaccuracies in the declared medications.  **Note: The testing scope of this panel includes these medications:  Oxycodone  **Note: The testing scope of this panel does not include following  reported medications:  Acetaminophen  Gabapentin  Naproxen  Tizanidine ==================================================================== For clinical consultation, please call [phone]. ====================================================================    UDS interpretation: Compliant          Medication Assessment Form: Reviewed. Patient indicates being compliant with therapy Treatment compliance: Compliant Risk Assessment Profile: Aberrant behavior: See initial evaluations. None observed or detected today Comorbid  factors increasing risk of overdose: See initial evaluation. No additional risks detected today Opioid risk tool (ORT):  Opioid Risk  06/04/2018  Alcohol 0  Illegal Drugs 0  Rx Drugs 0  Alcohol 0  Illegal Drugs 0  Rx Drugs 0  Age between 16-45 years  0  History of Preadolescent Sexual Abuse 0  Psychological Disease 0  Depression 0  Opioid Risk Tool Scoring 0  Opioid Risk Interpretation Low Risk    ORT Scoring interpretation table:  Score <3 = Low Risk for SUD  Score between 4-7 = Moderate Risk for SUD  Score >8 = High Risk for Opioid Abuse   Risk of substance  use disorder (SUD): Low  Risk Mitigation Strategies:  Patient Counseling: Covered Patient-Prescriber Agreement (PPA): Present and active  Notification to other healthcare providers: Done  Pharmacologic Plan: No change in therapy, at this time.             ROS  Constitutional: Denies any fever or chills Gastrointestinal: No reported hemesis, hematochezia, vomiting, or acute GI distress Musculoskeletal: Denies any acute onset joint swelling, redness, loss of ROM, or weakness Neurological: No reported episodes of acute onset apraxia, aphasia, dysarthria, agnosia, amnesia, paralysis, loss of coordination, or loss of consciousness  Medication Review  Oxycodone HCl, acetaminophen, gabapentin, naproxen sodium, and tiZANidine  History Review  Allergy: Mr. Fichera is allergic to tramadol. Drug: Mr. Ned  reports no history of drug use. Alcohol:  reports no history of alcohol use. Tobacco:  reports that he has been smoking cigarettes. He has been smoking about 0.50 packs per day. He has quit using smokeless tobacco. Social: Mr. Brendlinger  reports that he has been smoking cigarettes. He has been smoking about 0.50 packs per day. He has quit using smokeless tobacco. He reports that he does not drink alcohol or use drugs. Medical:  has a past medical history of Depression, BKA ([place]), and Panic attack. Surgical: Mr.  Biehn  has a past surgical history that includes Leg amputation; Hand surgery; and Leg Surgery. Family: family history includes Alzheimer's disease in his maternal grandmother; Cancer in his maternal grandfather, mother, and paternal grandmother; Diabetes in his father; Heart disease in his father; Hypertension in his father. Problem List: Mr. Luzier has Lumbar spondylosis; Chronic sacroiliac joint pain; and Chronic bilateral low back pain with left-sided sciatica on their pertinent problem list.  Lab Review  Kidney Function Lab Results  Component Value Date   BUN 11 12/05/2017   CREATININE 0.64 (L) 12/05/2017   BCR 17 12/05/2017   GFRAA 127 12/05/2017   GFRNONAA 110 12/05/2017  Liver Function Lab Results  Component Value Date   AST 18 12/05/2017   ALT 26 12/05/2017   ALBUMIN 4.2 12/05/2017  Note: Above Lab results reviewed.  Imaging Review  DG Tibia/Fibula Left CLINICAL DATA:  Stump pain  EXAM: LEFT TIBIA AND FIBULA - 2 VIEW  COMPARISON:  None.  FINDINGS: Two views study shows sequelae of BKA. No gas within the soft tissues. No worrisome lytic or sclerotic bony abnormality.  IMPRESSION: Unremarkable postoperative appearance in this patient status post BKA.  Electronically Signed   By: Misty Stanley M.D.   On: 10/14/2017 14:17 Note: Reviewed        Physical Exam  General appearance: Well nourished, well developed, and well hydrated. In no apparent acute distress Mental status: Alert, oriented x 3 (person, place, & time)       Respiratory: No evidence of acute respiratory distress Eyes: PERLA Vitals: BP (!) 150/89   Pulse (!) 105   Temp 98 F (36.7 C)   Ht 6' (1.829 m)   Wt (!) 328 lb (148.8 kg)   SpO2 93%   BMI 44.48 kg/m  BMI: Estimated body mass index is 44.48 kg/m as calculated from the following:   Height as of this encounter: 6' (1.829 m).   Weight as of this encounter: 328 lb (148.8 kg). Ideal: Ideal body weight: 77.6 kg (171 lb 1.2  oz) Adjusted ideal body weight: 106.1 kg (233 lb 13.5 oz) Lumbar Spine Area Exam  Skin & Axial Inspection: No masses, redness, or swelling Alignment: Symmetrical Functional ROM: Unrestricted ROM  Stability: No instability detected Muscle Tone/Strength: Functionally intact. No obvious neuro-muscular anomalies detected. Sensory (Neurological): Unimpaired Palpation: Complains of area being tender to palpation       Provocative Tests: Hyperextension/rotation test: deferred today       Lumbar quadrant test (Kemp's test): deferred today       Lateral bending test: deferred today        Lower Extremity Exam    Side: Right lower extremity  Side: Left lower extremity  Stability: No instability observed          Stability: No instability observed          Skin & Extremity Inspection: Skin color, temperature, and hair growth are WNL. No peripheral edema or cyanosis. No masses, redness, swelling, asymmetry, or associated skin lesions. No contractures.  Skin & Extremity Inspection: Below knee amputation (BKA)  Functional ROM: Unrestricted ROM                  Functional ROM: Adequate ROM                  Muscle Tone/Strength: Functionally intact. No obvious neuro-muscular anomalies detected.  Muscle Tone/Strength: Functionally intact. No obvious neuro-muscular anomalies detected.  Sensory (Neurological): Unimpaired        Sensory (Neurological): Unimpaired            Palpation: No palpable anomalies  Palpation: No palpable anomalies   Assessment   Status Diagnosis  Controlled Controlled Controlled 1. Lumbar spondylosis   2. Chronic sacroiliac joint pain   3. Chronic bilateral low back pain with left-sided sciatica   4. 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 Chronic pain syndrome   6. Long term prescription opiate use      Updated Problems: No problems updated.  Plan of Care  Pharmacotherapy (Medications Ordered): Meds ordered this encounter  Medications  .  gabapentin (NEURONTIN) 600 MG tablet    Sig: 1200 mg  BID    Dispense:  120 tablet    Refill:  2    Do not place this medication, or any other prescription from our practice, on "Automatic Refill". Patient may have prescription filled one day early if pharmacy is closed on scheduled refill date.    Order Specific Question:   Supervising Provider    Answer:   Gillis Santa [[id number]]  . Oxycodone HCl 10 MG TABS    Sig: Take 1 tablet (10 mg total) by mouth every 6 (six) hours as needed for up to 30 days.    Dispense:  120 tablet    Refill:  0    Do not place this medication, or any other prescription from our practice, on "Automatic Refill". Patient may have prescription filled one day early if pharmacy is closed on scheduled refill date.    Order Specific Question:   Supervising Provider    Answer:   Gillis Santa [[id number]]  . tiZANidine (ZANAFLEX) 4 MG tablet    Sig: Take 1 tablet (4 mg total) by mouth 3 (three) times daily.    Dispense:  90 tablet    Refill:  2    Order Specific Question:   Supervising Provider    Answer:   Gillis Santa [id number]  . Oxycodone HCl 10 MG TABS    Sig: Take 1 tablet (10 mg total) by mouth every 6 (six) hours as needed for up to 30 days. For chronic pain    Dispense:  120 tablet    Refill:  0    Do not place this medication, or any other prescription from our practice, on "Automatic Refill". Patient may have prescription filled one day early if pharmacy is closed on scheduled refill date.    Order Specific Question:   Supervising Provider    Answer:   Gillis Santa [[id number]]  . Oxycodone HCl 10 MG TABS    Sig: Take 1 tablet (10 mg total) by mouth every 6 (six) hours as needed for up to 30 days.    Dispense:  120 tablet    Refill:  0    Do not place this medication, or any other prescription from our practice, on "Automatic Refill". Patient may have prescription filled one day early if pharmacy is closed on scheduled refill date.    Order Specific Question:    Supervising Provider    Answer:   Gillis Santa [[id number]]   Administered today: [name] had no medications administered during this visit.  Orders:  Orders Placed This Encounter  Procedures  . ToxASSURE Select 13 (MW), Urine    Volume: 30 ml(s). Minimum 3 ml of urine is needed. Document temperature of fresh sample. Indications: Long term (current) use of opiate analgesic (C13.643)   Interventional options: Planned follow-up:   Not at this time Plan: Return in about 3 months (around 09/02/2018) for MedMgmt.    Note by: Dionisio David, NP Date: 06/04/2018; Time: 9:13 AM

## 2018-06-04 NOTE — Patient Instructions (Signed)
____________________________________________________________________________________________  Medication Rules  Purpose: To inform patients, and their family members, of our rules and regulations.  Applies to: All patients receiving prescriptions (written or electronic).  Pharmacy of record: Pharmacy where electronic prescriptions will be sent. If written prescriptions are taken to a different pharmacy, please inform the nursing staff. The pharmacy listed in the electronic medical record should be the one where you would like electronic prescriptions to be sent.  Electronic prescriptions: In compliance with the [place] Streng[place] Opioid Misuse Prevention (STOP) Act of 2017 (Session Law 2017-74/H243), effective April 11, 2018, all controlled substances must be electronically prescribed. Calling prescriptions to the pharmacy will cease to exist.  Prescription refills: Only during scheduled appointments. Applies to all prescriptions.  NOTE: The following applies primarily to controlled substances (Opioid* Pain Medications).   Patient's responsibilities: 1. Pain Pills: Bring all pain pills to every appointment (except for procedure appointments). 2. Pill Bottles: Bring pills in original pharmacy bottle. Always bring the newest bottle. Bring bottle, even if empty. 3. Medication refills: You are responsible for knowing and keeping track of what medications you take and those you need refilled. The day before your appointment: write a list of all prescriptions that need to be refilled. The day of the appointment: give the list to the admitting nurse. Prescriptions will be written only during appointments. No prescriptions will be written on procedure days. If you forget a medication: it will not be "Called in", "Faxed", or "electronically sent". You will need to get another appointment to get these prescribed. No early refills. Do not call asking to have your prescription filled  early. 4. Prescription Accuracy: You are responsible for carefully inspecting your prescriptions before leaving our office. Have the discharge nurse carefully go over each prescription with you, before taking them home. Make sure that your name is accurately spelled, that your address is correct. Check the name and dose of your medication to make sure it is accurate. Check the number of pills, and the written instructions to make sure they are clear and accurate. Make sure that you are given enough medication to last until your next medication refill appointment. 5. Taking Medication: Take medication as prescribed. When it comes to controlled substances, taking less pills or less frequently than prescribed is permitted and encouraged. Never take more pills than instructed. Never take medication more frequently than prescribed.  6. Inform other Doctors: Always inform, all of your healthcare providers, of all the medications you take. 7. Pain Medication from other Providers: You are not allowed to accept any additional pain medication from any other Doctor or Healthcare provider. There are two exceptions to this rule. (see below) In the event that you require additional pain medication, you are responsible for notifying us, as stated below. 8. Medication Agreement: You are responsible for carefully reading and following our Medication Agreement. This must be signed before receiving any prescriptions from our practice. Safely store a copy of your signed Agreement. Violations to the Agreement will result in no further prescriptions. (Additional copies of our Medication Agreement are available upon request.) 9. Laws, Rules, & Regulations: All patients are expected to follow all Federal and State Laws, Statutes, Rules, & Regulations. Ignorance of the Laws does not constitute a valid excuse. The use of any illegal substances is prohibited. 10. Adopted CDC guidelines & recommendations: Target dosing levels will be  at or below 60 MME/day. Use of benzodiazepines** is not recommended.  Exceptions: There are only two exceptions to the rule of not   receiving pain medications from other Healthcare Providers. 1. Exception #1 (Emergencies): In the event of an emergency (i.e.: accident requiring emergency care), you are allowed to receive additional pain medication. However, you are responsible for: As soon as you are able, call our office [phone], at any time of the day or night, and leave a message stating your name, the date and nature of the emergency, and the name and dose of the medication prescribed. In the event that your call is answered by a member of our staff, make sure to document and save the date, time, and the name of the person that took your information.  2. Exception #2 (Planned Surgery): In the event that you are scheduled by another doctor or dentist to have any type of surgery or procedure, you are allowed (for a period no longer than 30 days), to receive additional pain medication, for the acute post-op pain. However, in this case, you are responsible for picking up a copy of our "Post-op Pain Management for Surgeons" handout, and giving it to your surgeon or dentist. This document is available at our office, and does not require an appointment to obtain it. Simply go to our office during business hours (Monday-Thursday from 8:00 AM to 4:00 PM) (Friday 8:00 AM to 12:00 Noon) or if you have a scheduled appointment with us, prior to your surgery, and ask for it by name. In addition, you will need to provide us with your name, name of your surgeon, type of surgery, and date of procedure or surgery.  *Opioid medications include: morphine, codeine, oxycodone, oxymorphone, hydrocodone, hydromorphone, meperidine, tramadol, tapentadol, buprenorphine, fentanyl, methadone. **Benzodiazepine medications include: diazepam (Valium), alprazolam (Xanax), clonazepam (Klonopine), lorazepam (Ativan), clorazepate  (Tranxene), chlordiazepoxide (Librium), estazolam (Prosom), oxazepam (Serax), temazepam (Restoril), triazolam (Halcion) (Last updated: 06/08/2017) ____________________________________________________________________________________________    ______________________________________________________________________________________________  Specialty Pain Scale  Introduction:  There are significant differences in how pain is reported. The word pain usually refers to physical pain, but it is also a common synonym of suffering. The medical community uses a scale from 0 (zero) to 10 (ten) to report pain level. Zero (0) is described as "no pain", while ten (10) is described as "the worse pain you can imagine". The problem with this scale is that physical pain is reported along with suffering. Suffering refers to mental pain, or more often yet it refers to any unpleasant feeling, emotion or aversion associated with the perception of harm or threat of harm. It is the psychological component of pain.  Pain Specialists prefer to separate the two components. The pain scale used by this practice is the Verbal Numerical Rating Scale (VNRS-11). This scale is for the physical pain only. DO NOT INCLUDE how your pain psychologically affects you. This scale is for adults 21 years of age and older. It has 11 (eleven) levels. The 1st level is 0/10. This means: "right now, I have no pain". In the context of pain management, it also means: "right now, my physical pain is under control with the current therapy".  General Information:  The scale should reflect your current level of pain. Unless you are specifically asked for the level of your worst pain, or your average pain. If you are asked for one of these two, then it should be understood that it is over the past 24 hours.  Levels 1 (one) through 5 (five) are described below, and can be treated as an outpatient. Ambulatory pain management facilities such as ours are more  than adequate   to treat these levels. Levels 6 (six) through 10 (ten) are also described below, however, these must be treated as a hospitalized patient. While levels 6 (six) and 7 (seven) may be evaluated at an urgent care facility, levels 8 (eight) through 10 (ten) constitute medical emergencies and as such, they belong in a hospital's emergency department. When having these levels (as described below), do not come to our office. Our facility is not equipped to manage these levels. Go directly to an urgent care facility or an emergency department to be evaluated.  Definitions:  Activities of Daily Living (ADL): Activities of daily living (ADL or ADLs) is a term used in healthcare to refer to people's daily self-care activities. Health professionals often use a person's ability or inability to perform ADLs as a measurement of their functional status, particularly in regard to people post injury, with disabilities and the elderly. There are two ADL levels: Basic and Instrumental. Basic Activities of Daily Living (BADL  or BADLs) consist of self-care tasks that include: Bathing and showering; personal hygiene and grooming (including brushing/combing/styling hair); dressing; Toilet hygiene (getting to the toilet, cleaning oneself, and getting back up); eating and self-feeding (not including cooking or chewing and swallowing); functional mobility, often referred to as "transferring", as measured by the ability to walk, get in and out of bed, and get into and out of a chair; the broader definition (moving from one place to another while performing activities) is useful for people with different physical abilities who are still able to get around independently. Basic ADLs include the things many people do when they get up in the morning and get ready to go out of the house: get out of bed, go to the toilet, bathe, dress, groom, and eat. On the average, loss of function typically follows a particular order. Hygiene  is the first to go, followed by loss of toilet use and locomotion. The last to go is the ability to eat. When there is only one remaining area in which the person is independent, there is a 62.9% chance that it is eating and only a 3.5% chance that it is hygiene. Instrumental Activities of Daily Living (IADL or IADLs) are not necessary for fundamental functioning, but they let an individual live independently in a community. IADL consist of tasks that include: cleaning and maintaining the house; home establishment and maintenance; care of others (including selecting and supervising caregivers); care of pets; child rearing; managing money; managing financials (investments, etc.); meal preparation and cleanup; shopping for groceries and necessities; moving within the community; safety procedures and emergency responses; health management and maintenance (taking prescribed medications); and using the telephone or other form of communication.  Instructions:  Most patients tend to report their pain as a combination of two factors, their physical pain and their psychosocial pain. This last one is also known as "suffering" and it is reflection of how physical pain affects you socially and psychologically. From now on, report them separately.  From this point on, when asked to report your pain level, report only your physical pain. Use the following table for reference.  Pain Clinic Pain Levels (0-5/10)  Pain Level Score  Description  No Pain 0   Mild pain 1 Nagging, annoying, but does not interfere with basic activities of daily living (ADL). Patients are able to eat, bathe, get dressed, toileting (being able to get on and off the toilet and perform personal hygiene functions), transfer (move in and out of bed or a chair without   assistance), and maintain continence (able to control bladder and bowel functions). Blood pressure and heart rate are unaffected. A normal heart rate for a healthy adult ranges from 60 to  100 bpm (beats per minute).   Mild to moderate pain 2 Noticeable and distracting. Impossible to hide from other people. More frequent flare-ups. Still possible to adapt and function close to normal. It can be very annoying and may have occasional stronger flare-ups. With discipline, patients may get used to it and adapt.   Moderate pain 3 Interferes significantly with activities of daily living (ADL). It becomes difficult to feed, bathe, get dressed, get on and off the toilet or to perform personal hygiene functions. Difficult to get in and out of bed or a chair without assistance. Very distracting. With effort, it can be ignored when deeply involved in activities.   Moderately severe pain 4 Impossible to ignore for more than a few minutes. With effort, patients may still be able to manage work or participate in some social activities. Very difficult to concentrate. Signs of autonomic nervous system discharge are evident: dilated pupils (mydriasis); mild sweating (diaphoresis); sleep interference. Heart rate becomes elevated (>115 bpm). Diastolic blood pressure (lower number) rises above 100 mmHg. Patients find relief in laying down and not moving.   Severe pain 5 Intense and extremely unpleasant. Associated with frowning face and frequent crying. Pain overwhelms the senses.  Ability to do any activity or maintain social relationships becomes significantly limited. Conversation becomes difficult. Pacing back and forth is common, as getting into a comfortable position is nearly impossible. Pain wakes you up from deep sleep. Physical signs will be obvious: pupillary dilation; increased sweating; goosebumps; brisk reflexes; cold, clammy hands and feet; nausea, vomiting or dry heaves; loss of appetite; significant sleep disturbance with inability to fall asleep or to remain asleep. When persistent, significant weight loss is observed due to the complete loss of appetite and sleep deprivation.  Blood pressure  and heart rate becomes significantly elevated. Caution: If elevated blood pressure triggers a pounding headache associated with blurred vision, then the patient should immediately seek attention at an urgent or emergency care unit, as these may be signs of an impending stroke.    Emergency Department Pain Levels (6-10/10)  Emergency Room Pain 6 Severely limiting. Requires emergency care and should not be seen or managed at an outpatient pain management facility. Communication becomes difficult and requires great effort. Assistance to reach the emergency department may be required. Facial flushing and profuse sweating along with potentially dangerous increases in heart rate and blood pressure will be evident.   Distressing pain 7 Self-care is very difficult. Assistance is required to transport, or use restroom. Assistance to reach the emergency department will be required. Tasks requiring coordination, such as bathing and getting dressed become very difficult.   Disabling pain 8 Self-care is no longer possible. At this level, pain is disabling. The individual is unable to do even the most "basic" activities such as walking, eating, bathing, dressing, transferring to a bed, or toileting. Fine motor skills are lost. It is difficult to think clearly.   Incapacitating pain 9 Pain becomes incapacitating. Thought processing is no longer possible. Difficult to remember your own name. Control of movement and coordination are lost.   The worst pain imaginable 10 At this level, most patients pass out from pain. When this level is reached, collapse of the autonomic nervous system occurs, leading to a sudden drop in blood pressure and heart rate. This in turn   results in a temporary and dramatic drop in blood flow to the brain, leading to a loss of consciousness. Fainting is one of the body's self defense mechanisms. Passing out puts the brain in a calmed state and causes it to shut down for a while, in order to  begin the healing process.    Summary: 1. Refer to this scale when providing us with your pain level. 2. Be accurate and careful when reporting your pain level. This will help with your care. 3. Over-reporting your pain level will lead to loss of credibility. 4. Even a level of 1/10 means that there is pain and will be treated at our facility. 5. High, inaccurate reporting will be documented as "Symptom Exaggeration", leading to loss of credibility and suspicions of possible secondary gains such as obtaining more narcotics, or wanting to appear disabled, for fraudulent reasons. 6. Only pain levels of 5 or below will be seen at our facility. 7. Pain levels of 6 and above will be sent to the Emergency Department and the appointment cancelled. ______________________________________________________________________________________________    

## 2018-06-04 NOTE — Progress Notes (Signed)
Nursing Pain Medication Assessment:  Safety precautions to be maintained throughout the outpatient stay will include: orient to surroundings, keep bed in low position, maintain call bell within reach at all times, provide assistance with transfer out of bed and ambulation.  Medication Inspection Compliance: Pill count conducted under aseptic conditions, in front of the patient. Neither the pills nor the bottle was removed from the patient's sight at any time. Once count was completed pills were immediately returned to the patient in their original bottle.  Medication: Oxycodone IR Pill/Patch Count: 26 of 120 pills remain Pill/Patch Appearance: Markings consistent with prescribed medication Bottle Appearance: Standard pharmacy container. Clearly labeled. Filled Date: 2 / 1 / 2020 Last Medication intake:  Today

## 2018-06-06 ENCOUNTER — Telehealth: Payer: Self-pay | Admitting: *Deleted

## 2018-06-06 NOTE — Telephone Encounter (Signed)
yes

## 2018-06-06 NOTE — Telephone Encounter (Signed)
Called to [facility] to approve early fill date of March 1 which is one day early d/t patient traveling [place] of state.    Patient was informed that we would do this but that this is not the norm and typically we don't approve early fills.  Also, that his medication will need to last until the next appropriate fill date.  Patient verbalizes u/o information.

## 2018-06-06 NOTE — Telephone Encounter (Signed)
Please make him aware this will not be a normal occurrence once per year

## 2018-06-06 NOTE — Telephone Encounter (Signed)
Spoke with [name] Day he actually is traveling March 1 and his fill date is for March 2.  He will be traveling to [facility] for 7 days and is wanting to know if he can get his medicine 1 day early before leaving town.

## 2018-06-08 LAB — TOXASSURE SELECT 13 (MW), URINE

## 2018-08-03 ENCOUNTER — Ambulatory Visit: Payer: Medicaid Other | Admitting: Family Medicine

## 2018-08-03 ENCOUNTER — Other Ambulatory Visit: Payer: Self-pay

## 2018-08-03 ENCOUNTER — Encounter: Payer: Self-pay | Admitting: Family Medicine

## 2018-08-03 VITALS — BP 160/95 | HR 106 | Temp 98.0°F

## 2018-08-03 DIAGNOSIS — I10 Essential (primary) hypertension: Secondary | ICD-10-CM | POA: Diagnosis not present

## 2018-08-03 DIAGNOSIS — E781 Pure hyperglyceridemia: Secondary | ICD-10-CM

## 2018-08-03 DIAGNOSIS — R7309 Other abnormal glucose: Secondary | ICD-10-CM | POA: Diagnosis not present

## 2018-08-03 DIAGNOSIS — Z720 Tobacco use: Secondary | ICD-10-CM | POA: Diagnosis not present

## 2018-08-03 DIAGNOSIS — Z89512 Acquired absence of left leg below knee: Secondary | ICD-10-CM | POA: Diagnosis not present

## 2018-08-03 DIAGNOSIS — I152 Hypertension secondary to endocrine disorders: Secondary | ICD-10-CM | POA: Insufficient documentation

## 2018-08-03 MED ORDER — LOSARTAN POTASSIUM 50 MG PO TABS: 50.0000 mg | ORAL_TABLET | Freq: Every day | ORAL | 0 refills | Status: DC

## 2018-08-03 MED ORDER — HYDROXYZINE HCL 25 MG PO TABS: 25.0000 mg | ORAL_TABLET | Freq: Three times a day (TID) | ORAL | 0 refills | Status: DC | PRN

## 2018-08-03 NOTE — Progress Notes (Signed)
BP (!) 160/95 (BP Location: Right Arm, Cuff Size: Large)   Pulse (!) 106   Temp 98 F (36.7 C) (Oral)   SpO2 95%    Subjective:    Patient ID: [name] Amy., male    DOB: [birth year], 58 y.o.   MRN: [id number]  HPI: [name] Monteverde. is a 58 y.o. male  Chief Complaint  Patient presents with  . Pain    f/up  . Pruritis    pt states for about 5 years, his hands will swell and itch very bad   Presenting today with forms for a new prosthetic LLE. Current device has been causing ulcers and significant pain from ill fit.    BP elevated in office today. He notes it's been 150s-170s/80s when he's checked it at home. Has never really had a BP issue to his knowledge or been on medications for it that he can remember. Denies CP, SOB, dizziness. Does have chronic pain and does smoke and drink lots of caffeine. Does want to quit smoking eventually, but does not feel ready right now.   Hand swelling, itching, and redness at night. Sometimes takes benadryl for it which does seem to help. No new products, medications, outdoor exposures, history of skin issues.  Was found during CPE last year to have significantly elevated glucose in the 300s, was to have come back for follow up and labs to further investigate the next week but has been lost to f/u since. No known hx of DM. Does not watch what he eats and does not exercise regularly due to LLE amputation and chronic pain issues. No low blood sugar spells noted, and no recent hospitalizations for blood sugar issues. Denies notice of new sxs such as polydipsia, polyuria, visual changes, appetite changes, weight changes.   Hx of elevated triglycerides, not on therapy for that. Diet controlled.   Past Medical History:  Diagnosis Date  . Depression   . Hx of BKA ([place])    due to complicated fracture   . Panic attack    Social History   Socioeconomic History  . Marital status: Married    Spouse name: Not on file  . Number of  children: Not on file  . Years of education: Not on file  . Highest education level: Not on file  Occupational History  . Not on file  Social Needs  . Financial resource strain: Not on file  . Food insecurity:    Worry: Not on file    Inability: Not on file  . Transportation needs:    Medical: Not on file    Non-medical: Not on file  Tobacco Use  . Smoking status: Current Every Day Smoker    Packs/day: 0.50    Types: Cigarettes  . Smokeless tobacco: Former Network engineer and Sexual Activity  . Alcohol use: No  . Drug use: No  . Sexual activity: Not on file  Lifestyle  . Physical activity:    Days per week: Not on file    Minutes per session: Not on file  . Stress: Not on file  Relationships  . Social connections:    Talks on phone: Not on file    Gets together: Not on file    Attends religious service: Not on file    Active member of club or organization: Not on file    Attends meetings of clubs or organizations: Not on file    Relationship status: Not on file  . Intimate  partner violence:    Fear of current or ex partner: Not on file    Emotionally abused: Not on file    Physically abused: Not on file    Forced sexual activity: Not on file  Other Topics Concern  . Not on file  Social History Narrative  . Not on file   Relevant past medical, surgical, family and social history reviewed and updated as indicated. Interim medical history since our last visit reviewed. Allergies and medications reviewed and updated.  Review of Systems  Per HPI unless specifically indicated above     Objective:    BP (!) 160/95 (BP Location: Right Arm, Cuff Size: Large)   Pulse (!) 106   Temp 98 F (36.7 C) (Oral)   SpO2 95%   Wt Readings from Last 3 Encounters:  06/04/18 (!) 328 lb (148.8 kg)  03/12/18 (!) 328 lb (148.8 kg)  12/12/17 (!) 326 lb (147.9 kg)    Physical Exam Vitals signs and nursing note reviewed.  Constitutional:      Appearance: Normal appearance.   HENT:     Head: Atraumatic.  Eyes:     Extraocular Movements: Extraocular movements intact.     Conjunctiva/sclera: Conjunctivae normal.  Neck:     Musculoskeletal: Normal range of motion and neck supple.  Cardiovascular:     Rate and Rhythm: Normal rate and regular rhythm.  Pulmonary:     Effort: Pulmonary effort is normal. No respiratory distress.     Breath sounds: No wheezing.  Musculoskeletal: Normal range of motion.     Comments: S/p Left lower leg amputation  Skin:    General: Skin is warm and dry.  Neurological:     General: No focal deficit present.     Mental Status: He is oriented to person, place, and time.  Psychiatric:        Mood and Affect: Mood normal.        Thought Content: Thought content normal.        Judgment: Judgment normal.     Results for orders placed or performed in visit on 08/03/18  Comprehensive metabolic panel  Result Value Ref Range   Glucose 331 (H) 65 - 99 mg/dL   BUN 9 6 - 24 mg/dL   Creatinine, Ser 0.55 (L) 0.76 - 1.27 mg/dL   GFR calc non Af Amer 116 >59 mL/min/1.73   GFR calc Af Amer 135 >59 mL/min/1.73   BUN/Creatinine Ratio 16 9 - 20   Sodium 137 134 - 144 mmol/L   Potassium 4.3 3.5 - 5.2 mmol/L   Chloride 96 96 - 106 mmol/L   CO2 23 20 - 29 mmol/L   Calcium 9.2 8.7 - 10.2 mg/dL   Total Protein 7.1 6.0 - 8.5 g/dL   Albumin 4.5 3.8 - 4.9 g/dL   Globulin, Total 2.6 1.5 - 4.5 g/dL   Albumin/Globulin Ratio 1.7 1.2 - 2.2   Bilirubin Total 0.3 0.0 - 1.2 mg/dL   Alkaline Phosphatase 82 39 - 117 IU/L   AST 27 0 - 40 IU/L   ALT 33 0 - 44 IU/L  Lipid Panel w/o Chol/HDL Ratio  Result Value Ref Range   Cholesterol, Total 171 100 - 199 mg/dL   Triglycerides 388 (H) 0 - 149 mg/dL   HDL 30 (L) >39 mg/dL   VLDL Cholesterol Cal 78 (H) 5 - 40 mg/dL   LDL Calculated 63 0 - 99 mg/dL  HgB A1c  Result Value Ref Range   Hgb A1c  MFr Bld 11.4 (H) 4.8 - 5.6 %   Est. average glucose Bld gHb Est-mCnc 280 mg/dL      Assessment & Plan:    Problem List Items Addressed This Visit      Cardiovascular and Mediastinum   Essential hypertension    BPs staying consistently elevated. Start low dose losartan, script given for home BP monitor and normal ranges reviewed. F/u in 1 month, call with persistent abnormal readings in meantime.       Relevant Medications   losartan (COZAAR) 50 MG tablet   Other Relevant Orders   Comprehensive metabolic panel (Completed)     Other   S/P BKA (below knee amputation) unilateral, left ([place]) - Primary    I saw patient in the clinic. His current prosthesis is ill-fitting and causing a wound on residual limb. His limb shape has changed due to significant weight loss and limb maturation and because of this, does not fit correctly at this point. He wears over 15 ply socks but the prosthesis still doesn't fit correctly. It is medically necessary in my opinion for him to be fit with a new prosthesis that fits properly and is suitable to his new weight and body structure.   Follow up with [facility] and Orthotics for new prosthesis      Tobacco abuse    Not ready to quit at this time, 3 minutes of counseling given on risks and long term effects of smoking.       Hypertriglyceridemia    Lifestyle modifications, start fish oil, recheck labs today      Relevant Medications   losartan (COZAAR) 50 MG tablet   Other Relevant Orders   Lipid Panel w/o Chol/HDL Ratio (Completed)    Other Visit Diagnoses    Abnormal blood sugar       Check A1C, add medications as needed. Extensively counseled on lifestyle changes and risks of long term abnormal blood sugars   Relevant Orders   HgB A1c (Completed)       Follow up plan: Return in about 4 weeks (around 08/31/2018) for BP f/u.

## 2018-08-04 LAB — COMPREHENSIVE METABOLIC PANEL
ALT: 33 IU/L (ref 0–44)
AST: 27 IU/L (ref 0–40)
Albumin/Globulin Ratio: 1.7 (ref 1.2–2.2)
Albumin: 4.5 g/dL (ref 3.8–4.9)
Alkaline Phosphatase: 82 IU/L (ref 39–117)
BUN/Creatinine Ratio: 16 (ref 9–20)
BUN: 9 mg/dL (ref 6–24)
Bilirubin Total: 0.3 mg/dL (ref 0.0–1.2)
CO2: 23 mmol/L (ref 20–29)
Calcium: 9.2 mg/dL (ref 8.7–10.2)
Chloride: 96 mmol/L (ref 96–106)
Creatinine, Ser: 0.55 mg/dL — ABNORMAL LOW (ref 0.76–1.27)
GFR calc Af Amer: 135 mL/min/{1.73_m2} (ref >59)
GFR calc non Af Amer: 116 mL/min/{1.73_m2} (ref >59)
Globulin, Total: 2.6 g/dL (ref 1.5–4.5)
Glucose: 331 mg/dL — ABNORMAL HIGH (ref 65–99)
Potassium: 4.3 mmol/L (ref 3.5–5.2)
Sodium: 137 mmol/L (ref 134–144)
Total Protein: 7.1 g/dL (ref 6.0–8.5)

## 2018-08-04 LAB — LIPID PANEL W/O CHOL/HDL RATIO
Cholesterol, Total: 171 mg/dL (ref 100–199)
HDL: 30 mg/dL — ABNORMAL LOW (ref >39)
LDL Calculated: 63 mg/dL (ref 0–99)
Triglycerides: 388 mg/dL — ABNORMAL HIGH (ref 0–149)
VLDL Cholesterol Cal: 78 mg/dL — ABNORMAL HIGH (ref 5–40)

## 2018-08-04 LAB — HEMOGLOBIN A1C
Est. average glucose Bld gHb Est-mCnc: 280 mg/dL
Hgb A1c MFr Bld: 11.4 % — ABNORMAL HIGH (ref 4.8–5.6)

## 2018-08-06 ENCOUNTER — Telehealth: Payer: Self-pay | Admitting: Family Medicine

## 2018-08-06 DIAGNOSIS — E1165 Type 2 diabetes mellitus with hyperglycemia: Secondary | ICD-10-CM

## 2018-08-06 DIAGNOSIS — E781 Pure hyperglyceridemia: Secondary | ICD-10-CM | POA: Insufficient documentation

## 2018-08-06 MED ORDER — SITAGLIPTIN PHOS-METFORMIN HCL 50-500 MG PO TABS: 1.0000 | ORAL_TABLET | Freq: Two times a day (BID) | ORAL | 0 refills | Status: DC

## 2018-08-06 NOTE — Assessment & Plan Note (Signed)
BPs staying consistently elevated. Start low dose losartan, script given for home BP monitor and normal ranges reviewed. F/u in 1 month, call with persistent abnormal readings in meantime.

## 2018-08-06 NOTE — Assessment & Plan Note (Signed)
Not ready to quit at this time, 3 minutes of counseling given on risks and long term effects of smoking.

## 2018-08-06 NOTE — Assessment & Plan Note (Signed)
Lifestyle modifications, start fish oil, recheck labs today

## 2018-08-06 NOTE — Assessment & Plan Note (Signed)
I saw patient in the clinic. His current prosthesis is ill-fitting and causing a wound on residual limb. His limb shape has changed due to significant weight loss and limb maturation and because of this, does not fit correctly at this point. He wears over 15 ply socks but the prosthesis still doesn't fit correctly. It is medically necessary in my opinion for him to be fit with a new prosthesis that fits properly and is suitable to his new weight and body structure.   Follow up with [facility] and Orthotics for new prosthesis

## 2018-08-06 NOTE — Telephone Encounter (Signed)
Called pt to discuss lab results and new DM dx. A1C markedly elevated, will refer to [facility] and start janumet. Pt wanting to avoid injectable medications and overhaul diet to see how far that gets him. Questions answered at length. Also discussed continued elevation in triglycerides, will work on the diet piece and add fish oil supplements and recheck in 3 months.   Pt to f/u in 1 month for BP recheck as scheduled, will see how new medicine is going at that time as well

## 2018-08-07 ENCOUNTER — Telehealth: Payer: Self-pay

## 2018-08-07 NOTE — Telephone Encounter (Signed)
PA for Janumet initiated and submitted through [employer]. Confirmation number: [id number]

## 2018-08-08 NOTE — Telephone Encounter (Signed)
PA denied on [place] [employer]. Patient must try and fail metformin before approval.

## 2018-08-09 MED ORDER — METFORMIN HCL 500 MG PO TABS: 500.0000 mg | ORAL_TABLET | Freq: Two times a day (BID) | ORAL | 2 refills | Status: DC

## 2018-08-09 MED ORDER — SITAGLIPTIN PHOSPHATE 50 MG PO TABS: 50.0000 mg | ORAL_TABLET | Freq: Every day | ORAL | 2 refills | Status: DC

## 2018-08-09 NOTE — Telephone Encounter (Signed)
Separate components sent in instead

## 2018-08-09 NOTE — Telephone Encounter (Signed)
Message relayed to patient. Verbalized understanding and denied questions.   

## 2018-08-28 ENCOUNTER — Encounter: Payer: Self-pay | Admitting: *Deleted

## 2018-08-28 ENCOUNTER — Ambulatory Visit: Payer: Medicaid Other | Attending: Nurse Practitioner | Admitting: Nurse Practitioner

## 2018-08-28 ENCOUNTER — Other Ambulatory Visit: Payer: Self-pay

## 2018-08-28 ENCOUNTER — Encounter: Payer: Medicaid Other | Attending: Family Medicine | Admitting: *Deleted

## 2018-08-28 VITALS — BP 130/80 | Ht 72.0 in | Wt 313.7 lb

## 2018-08-28 DIAGNOSIS — G894 Chronic pain syndrome: Secondary | ICD-10-CM

## 2018-08-28 DIAGNOSIS — M7918 Myalgia, other site: Secondary | ICD-10-CM

## 2018-08-28 DIAGNOSIS — M533 Sacrococcygeal disorders, not elsewhere classified: Secondary | ICD-10-CM | POA: Diagnosis not present

## 2018-08-28 DIAGNOSIS — Z89512 Acquired absence of left leg below knee: Secondary | ICD-10-CM | POA: Diagnosis not present

## 2018-08-28 DIAGNOSIS — G8929 Other chronic pain: Secondary | ICD-10-CM | POA: Diagnosis not present

## 2018-08-28 DIAGNOSIS — Z713 Dietary counseling and surveillance: Secondary | ICD-10-CM | POA: Insufficient documentation

## 2018-08-28 DIAGNOSIS — M47816 Spondylosis without myelopathy or radiculopathy, lumbar region: Secondary | ICD-10-CM | POA: Diagnosis not present

## 2018-08-28 DIAGNOSIS — E119 Type 2 diabetes mellitus without complications: Secondary | ICD-10-CM | POA: Diagnosis not present

## 2018-08-28 DIAGNOSIS — E1165 Type 2 diabetes mellitus with hyperglycemia: Secondary | ICD-10-CM

## 2018-08-28 DIAGNOSIS — G546 Phantom limb syndrome with pain: Secondary | ICD-10-CM | POA: Diagnosis not present

## 2018-08-28 DIAGNOSIS — Z6841 Body Mass Index (BMI) 40.0 and over, adult: Secondary | ICD-10-CM | POA: Diagnosis not present

## 2018-08-28 MED ORDER — TIZANIDINE HCL 4 MG PO TABS: 4.0000 mg | ORAL_TABLET | Freq: Three times a day (TID) | ORAL | 2 refills | Status: DC

## 2018-08-28 MED ORDER — GABAPENTIN 600 MG PO TABS: ORAL_TABLET | ORAL | 2 refills | Status: DC

## 2018-08-28 MED ORDER — OXYCODONE HCL 10 MG PO TABS: 10.0000 mg | ORAL_TABLET | Freq: Four times a day (QID) | ORAL | 0 refills | Status: DC | PRN

## 2018-08-28 MED ORDER — OXYCODONE HCL 10 MG PO TABS: 10.0000 mg | ORAL_TABLET | Freq: Four times a day (QID) | ORAL | 0 refills | Status: AC | PRN

## 2018-08-28 NOTE — Progress Notes (Signed)
Pain Management Encounter Note - Virtual Visit via Telephone Telehealth (real-time audio visits between healthcare provider and patient).  Patient's Phone No. & Preferred Pharmacy:  [phone] (home); [phone] (mobile); (Preferred) [phone]  [id number] [place], [facility] [address] [place] [address] Phone: [phone] Fax: [phone]   Pre-screening note:  Our staff contacted [name] Day and offered him an "in person", "face-to-face" appointment versus a telephone encounter. He indicated preferring the telephone encounter, at this time.  Reason for Virtual Visit: COVID-19*  Social distancing based on CDC and AMA recommendations.   I contacted [name] Day. on 08/28/2018 at 9:12 AM by telephone and clearly identified myself as [name] David, NP. I verified that I was speaking with the correct person using two identifiers (Name and date of birth: 1961-02-24).  Advanced Informed Consent I sought verbal advanced consent from [name] Day. for telemedicine interactions and virtual visit. I informed [name] Day of the security and privacy concerns, risks, and limitations associated with performing an evaluation and management service by telephone. I also informed [name] Day of the availability of "in person" appointments and I informed him of the possibility of a patient responsible charge related to this service. [name] Day expressed understanding and agreed to proceed.   Historic Elements   [name] Day. is a 58 y.o. year old, male patient evaluated today after his last encounter by our practice on 06/06/2018. [name] Day  has a past medical history of Depression, BKA ([place]), and Panic attack. He also  has a past surgical history that includes Leg amputation; Hand surgery; and Leg Surgery. [name] Day has a current medication list which includes the following prescription(s): acetaminophen, gabapentin,  hydroxyzine, losartan, metformin, naproxen sodium, oxycodone hcl, oxycodone hcl, oxycodone hcl, sitagliptin, sitagliptin-metformin, and tizanidine. He  reports that he has been smoking cigarettes. He has been smoking about 0.50 packs per day. He has quit using smokeless tobacco. He reports that he does not drink alcohol or use drugs. [name] Day is allergic to tramadol.   HPI  I last saw him on 06/04/2018. He is being evaluated for medication management. He rates his pain 5/10 in "nub" and hip pain. He is having numbness in his stump. He is also having some burning. He denies any recent falls or injuries. He denies any new pain concerns. He   Pharmacotherapy Assessment  Analgesic:Oxycodone 10 mg QID prn MME/day:60 MME  Monitoring: Pharmacotherapy: No side-effects or adverse reactions reported. [place] PMP: PDMP reviewed during this encounter.       Compliance: No problems identified. Plan: Refer to "POC".  Review of recent tests  DG Tibia/Fibula Left CLINICAL DATA:  Stump pain  EXAM: LEFT TIBIA AND FIBULA - 2 VIEW  COMPARISON:  None.  FINDINGS: Two views study shows sequelae of BKA. No gas within the soft tissues. No worrisome lytic or sclerotic bony abnormality.  IMPRESSION: Unremarkable postoperative appearance in this patient status post BKA.  Electronically Signed   By: Misty Stanley M.D.   On: 10/14/2017 14:17   Office Visit on 08/03/2018  Component Date Value Ref Range Status  . Glucose 08/03/2018 331* 65 - 99 mg/dL Final  . BUN 08/03/2018 9  6 - 24 mg/dL Final  . Creatinine, Ser 08/03/2018 0.55* 0.76 - 1.27 mg/dL Final  . GFR calc non Af Amer 08/03/2018 116  >59 mL/min/1.73 Final  . GFR calc Af Amer 08/03/2018 135  >59 mL/min/1.73 Final  . BUN/Creatinine Ratio 08/03/2018 16  9 - 20 Final  . Sodium 08/03/2018 137  134 - 144 mmol/L Final  . Potassium 08/03/2018 4.3  3.5 - 5.2 mmol/L Final  . Chloride 08/03/2018 96  96 - 106 mmol/L Final  . CO2 08/03/2018 23  20 - 29  mmol/L Final  . Calcium 08/03/2018 9.2  8.7 - 10.2 mg/dL Final  . Total Protein 08/03/2018 7.1  6.0 - 8.5 g/dL Final  . Albumin 08/03/2018 4.5  3.8 - 4.9 g/dL Final  . Globulin, Total 08/03/2018 2.6  1.5 - 4.5 g/dL Final  . Albumin/Globulin Ratio 08/03/2018 1.7  1.2 - 2.2 Final  . Bilirubin Total 08/03/2018 0.3  0.0 - 1.2 mg/dL Final  . Alkaline Phosphatase 08/03/2018 82  39 - 117 IU/L Final  . AST 08/03/2018 27  0 - 40 IU/L Final  . ALT 08/03/2018 33  0 - 44 IU/L Final  . Cholesterol, Total 08/03/2018 171  100 - 199 mg/dL Final  . Triglycerides 08/03/2018 388* 0 - 149 mg/dL Final  . HDL 08/03/2018 30* >39 mg/dL Final  . VLDL Cholesterol Cal 08/03/2018 78* 5 - 40 mg/dL Final  . LDL Calculated 08/03/2018 63  0 - 99 mg/dL Final  . Hgb A1c MFr Bld 08/03/2018 11.4* 4.8 - 5.6 % Final   Comment:          Prediabetes: 5.7 - 6.4          Diabetes: >6.4          Glycemic control for adults with diabetes: <7.0   . Est. average glucose Bld gHb Est-m* 08/03/2018 280  mg/dL Final   Assessment  The primary encounter diagnosis was Lumbar spondylosis. Diagnoses of Chronic sacroiliac joint pain, 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HCC), Phantom limb pain ([place]), S/P BKA (below knee amputation) unilateral, left (H[facility]), Chronic pain syndrome, and Chronic musculoskeletal pain were also pertinent to this visit.  Plan of Care  I am having [name] Day. maintain his acetaminophen, naproxen sodium, hydrOXYzine, losartan, sitaGLIPtin-metformin, metFORMIN, sitaGLIPtin, gabapentin, tiZANidine, Oxycodone HCl, Oxycodone HCl, and Oxycodone HCl.  Pharmacotherapy (Medications Ordered): Meds ordered this encounter  Medications  . gabapentin (NEURONTIN) 600 MG tablet    Sig: 1200 mg  BID    Dispense:  120 tablet    Refill:  2    Do not place this medication, or any other prescription from our practice, on "Automatic Refill". Patient may have prescription filled one day early if pharmacy is  closed on scheduled refill date.    Order Specific Question:   Supervising Provider    Answer:   [name] Day [[id number]]  . tiZANidine (ZANAFLEX) 4 MG tablet    Sig: Take 1 tablet (4 mg total) by mouth 3 (three) times daily.    Dispense:  90 tablet    Refill:  2    Order Specific Question:   Supervising Provider    Answer:   [name] Day [id number]  . Oxycodone HCl 10 MG TABS    Sig: Take 1 tablet (10 mg total) by mouth every 6 (six) hours as needed for up to 30 days.    Dispense:  120 tablet    Refill:  0    Do not place this medication, or any other prescription from our practice, on "Automatic Refill". Patient may have prescription filled one day early if pharmacy is closed on scheduled refill date.    Order Specific Question:   Supervising Provider    Answer:   [name] Day [[id number]]  .  Oxycodone HCl 10 MG TABS    Sig: Take 1 tablet (10 mg total) by mouth every 6 (six) hours as needed for up to 30 days. For chronic pain    Dispense:  120 tablet    Refill:  0    Do not place this medication, or any other prescription from our practice, on "Automatic Refill". Patient may have prescription filled one day early if pharmacy is closed on scheduled refill date.    Order Specific Question:   Supervising Provider    Answer:   [name] Day [[id number]]  . Oxycodone HCl 10 MG TABS    Sig: Take 1 tablet (10 mg total) by mouth every 6 (six) hours as needed for up to 30 days.    Dispense:  120 tablet    Refill:  0    Do not place this medication, or any other prescription from our practice, on "Automatic Refill". Patient may have prescription filled one day early if pharmacy is closed on scheduled refill date.    Order Specific Question:   Supervising Provider    Answer:   [name] Day [[id number]]   Orders:  No orders of the defined types were placed in this encounter.  Follow-up plan:   Return in about 3 months (around 11/28/2018) for MedMgmt.   I discussed the assessment and treatment plan with  the patient. The patient was provided an opportunity to ask questions and all were answered. The patient agreed with the plan and demonstrated an understanding of the instructions.  Patient advised to call back or seek an in-person evaluation if the symptoms or condition worsens.  Total duration of non-face-to-face encounter: 10 minutes.  Note by: [name] David, NP Date: 08/28/2018; Time: 9:12 AM  Disclaimer:  * Given the special circumstances of the COVID-19 pandemic, the federal government has announced that the Office for Civil Rights (OCR) will exercise its enforcement discretion and will not impose penalties on physicians using telehealth in the event of noncompliance with regulatory requirements under the [place] (HIPAA) in connection with the good faith provision of telehealth during the INOMV-67 national public health emergency. ([place])

## 2018-08-28 NOTE — Patient Instructions (Signed)
Check blood sugars 1-2 x day before breakfast and 2 hrs after supper every day Bring blood sugar records to the next class  Call your doctor for a prescription for: 1. Meter strips (type) Accu-Chek Guide checking 1-2 times per day 2. Lancets (type) Accu-Chek Fastclix checking  1-2   times per day  Exercise: Walk as tolerated   Eat 3 meals day,   1-2  snacks a day Space meals 4-6 hours apart Include 1 serving of protein with breakfast  Quit smoking  Make an eye doctor appointment when blood sugars stable  Return for classes on:

## 2018-08-28 NOTE — Patient Instructions (Signed)
____________________________________________________________________________________________  Medication Rules  Purpose: To inform patients, and their family members, of our rules and regulations.  Applies to: All patients receiving prescriptions (written or electronic).  Pharmacy of record: Pharmacy where electronic prescriptions will be sent. If written prescriptions are taken to a different pharmacy, please inform the nursing staff. The pharmacy listed in the electronic medical record should be the one where you would like electronic prescriptions to be sent.  Electronic prescriptions: In compliance with the [place] Strengthen Opioid Misuse Prevention (STOP) Act of 2017 (Session Law 2017-74/H243), effective April 11, 2018, all controlled substances must be electronically prescribed. Calling prescriptions to the pharmacy will cease to exist.  Prescription refills: Only during scheduled appointments. Applies to all prescriptions.  NOTE: The following applies primarily to controlled substances (Opioid* Pain Medications).   Patient's responsibilities: 1. Pain Pills: Bring all pain pills to every appointment (except for procedure appointments). 2. Pill Bottles: Bring pills in original pharmacy bottle. Always bring the newest bottle. Bring bottle, even if empty. 3. Medication refills: You are responsible for knowing and keeping track of what medications you take and those you need refilled. The day before your appointment: write a list of all prescriptions that need to be refilled. The day of the appointment: give the list to the admitting nurse. Prescriptions will be written only during appointments. No prescriptions will be written on procedure days. If you forget a medication: it will not be "Called in", "Faxed", or "electronically sent". You will need to get another appointment to get these prescribed. No early refills. Do not call asking to have your prescription filled  early. 4. Prescription Accuracy: You are responsible for carefully inspecting your prescriptions before leaving our office. Have the discharge nurse carefully go over each prescription with you, before taking them home. Make sure that your name is accurately spelled, that your address is correct. Check the name and dose of your medication to make sure it is accurate. Check the number of pills, and the written instructions to make sure they are clear and accurate. Make sure that you are given enough medication to last until your next medication refill appointment. 5. Taking Medication: Take medication as prescribed. When it comes to controlled substances, taking less pills or less frequently than prescribed is permitted and encouraged. Never take more pills than instructed. Never take medication more frequently than prescribed.  6. Inform other Doctors: Always inform, all of your healthcare providers, of all the medications you take. 7. Pain Medication from other Providers: You are not allowed to accept any additional pain medication from any other Doctor or Healthcare provider. There are two exceptions to this rule. (see below) In the event that you require additional pain medication, you are responsible for notifying us, as stated below. 8. Medication Agreement: You are responsible for carefully reading and following our Medication Agreement. This must be signed before receiving any prescriptions from our practice. Safely store a copy of your signed Agreement. Violations to the Agreement will result in no further prescriptions. (Additional copies of our Medication Agreement are available upon request.) 9. Laws, Rules, & Regulations: All patients are expected to follow all Federal and State Laws, Statutes, Rules, & Regulations. Ignorance of the Laws does not constitute a valid excuse. The use of any illegal substances is prohibited. 10. Adopted CDC guidelines & recommendations: Target dosing levels will be  at or below 60 MME/day. Use of benzodiazepines** is not recommended.  Exceptions: There are only two exceptions to the rule of not   receiving pain medications from other Healthcare Providers. 1. Exception #1 (Emergencies): In the event of an emergency (i.e.: accident requiring emergency care), you are allowed to receive additional pain medication. However, you are responsible for: As soon as you are able, call our office [phone], at any time of the day or night, and leave a message stating your name, the date and nature of the emergency, and the name and dose of the medication prescribed. In the event that your call is answered by a member of our staff, make sure to document and save the date, time, and the name of the person that took your information.  2. Exception #2 (Planned Surgery): In the event that you are scheduled by another doctor or dentist to have any type of surgery or procedure, you are allowed (for a period no longer than 30 days), to receive additional pain medication, for the acute post-op pain. However, in this case, you are responsible for picking up a copy of our "Post-op Pain Management for Surgeons" handout, and giving it to your surgeon or dentist. This document is available at our office, and does not require an appointment to obtain it. Simply go to our office during business hours (Monday-Thursday from 8:00 AM to 4:00 PM) (Friday 8:00 AM to 12:00 Noon) or if you have a scheduled appointment with us, prior to your surgery, and ask for it by name. In addition, you will need to provide us with your name, name of your surgeon, type of surgery, and date of procedure or surgery.  *Opioid medications include: morphine, codeine, oxycodone, oxymorphone, hydrocodone, hydromorphone, meperidine, tramadol, tapentadol, buprenorphine, fentanyl, methadone. **Benzodiazepine medications include: diazepam (Valium), alprazolam (Xanax), clonazepam (Klonopine), lorazepam (Ativan), clorazepate  (Tranxene), chlordiazepoxide (Librium), estazolam (Prosom), oxazepam (Serax), temazepam (Restoril), triazolam (Halcion) (Last updated: 06/08/2017) ____________________________________________________________________________________________    

## 2018-08-29 NOTE — Progress Notes (Signed)
Diabetes Self-Management Education  Visit Type: First/Initial  Appt. Start Time: 1545 Appt. End Time: 1700  08/28/2018  [name], identified by name and date of birth, is a 58 y.o. male with a diagnosis of Diabetes: Type 2.   ASSESSMENT  Blood pressure 130/80, height 6' (1.829 m), weight (!) 313 lb 11.2 oz (142.3 kg). Body mass index is 42.55 kg/m.  Diabetes Self-Management Education - 08/28/18 1659      Visit Information   Visit Type  First/Initial      Initial Visit   Diabetes Type  Type 2    Are you currently following a meal plan?  Yes    What type of meal plan do you follow?  "stopped sugary drinks, not eating after 6 pm, decreased portions, no bread"    Are you taking your medications as prescribed?  Yes    Date Diagnosed  3 weeks ago      Health Coping   How would you rate your overall health?  Fair      Psychosocial Assessment   Patient Belief/Attitude about Diabetes  Motivated to manage diabetes    Self-care barriers  None    Self-management support  Doctor's office;Family    Patient Concerns  Nutrition/Meal planning;Medication;Monitoring;Healthy Lifestyle;Problem Solving;Glycemic Control;Weight Control    Special Needs  None    Preferred Learning Style  Auditory;Visual;Hands on    [place] in progress    How often do you need to have someone help you when you read instructions, pamphlets, or other written materials from your doctor or pharmacy?  1 - Never    What is the last grade level you completed in school?  GED      Pre-Education Assessment   Patient understands the diabetes disease and treatment process.  Needs Instruction    Patient understands incorporating nutritional management into lifestyle.  Needs Instruction    Patient undertands incorporating physical activity into lifestyle.  Needs Instruction    Patient understands using medications safely.  Needs Instruction    Patient understands monitoring blood glucose,  interpreting and using results  Needs Instruction    Patient understands prevention, detection, and treatment of acute complications.  Needs Instruction    Patient understands prevention, detection, and treatment of chronic complications.  Needs Instruction    Patient understands how to develop strategies to address psychosocial issues.  Needs Instruction    Patient understands how to develop strategies to promote health/change behavior.  Needs Instruction      Complications   Last HgB A1C per patient/outside source  11.4 %   08/03/18   How often do you check your blood sugar?  0 times/day (not testing)   Provided Accu-Chek Guide Me meter and instructed on use. BG upon return demonstration was 210 mg/dL at 4:40 pm 2 1/2 hrs pp.    Have you had a dilated eye exam in the past 12 months?  No    Have you had a dental exam in the past 12 months?  No    Are you checking your feet?  Yes    How many days per week are you checking your feet?  7      Dietary Intake   Breakfast  cereal with no milk and banana    Snack (morning)  peanut butter crackers; lunchable    Lunch  [place] or ham wrap, banana    Dinner  baked chicken, beef, pork, fish potatoes, peas, benas, corn, rice, pasta, lettuce, tomatoes, cuccumbers,  peppers, broccoli, cauliflower, okra, green beans    Beverage(s)  water, coffee, diet green tea, sugar free drinks      Exercise   Exercise Type  ADL's      Patient Education   Previous Diabetes Education  No    Disease state   Definition of diabetes, type 1 and 2, and the diagnosis of diabetes;Factors that contribute to the development of diabetes    Nutrition management   Role of diet in the treatment of diabetes and the relationship between the three main macronutrients and blood glucose level;Reviewed blood glucose goals for pre and post meals and how to evaluate the patients' food intake on their blood glucose level.    Physical activity and exercise   Role of exercise on diabetes  management, blood pressure control and cardiac health.    Medications  Reviewed patients medication for diabetes, action, purpose, timing of dose and side effects.    Monitoring  Taught/evaluated SMBG meter.;Purpose and frequency of SMBG.;Taught/discussed recording of test results and interpretation of SMBG.;Identified appropriate SMBG and/or A1C goals.    Chronic complications  Relationship between chronic complications and blood glucose control;Retinopathy and reason for yearly dilated eye exams    Personal strategies to promote health  Review risk of smoking and offered smoking cessation      Individualized Goals (developed by patient)   Reducing Risk  Improve blood sugars Decrease medications Prevent diabetes complications Lose weight Lead a healthier lifestyle Become more fit Quit smoking     Outcomes   Expected Outcomes  Demonstrated interest in learning. Expect positive outcomes    Future DMSE  4-6 wks       Individualized Plan for Diabetes Self-Management Training:   Learning Objective:  Patient will have a greater understanding of diabetes self-management. Patient education plan is to attend individual and/or group sessions per assessed needs and concerns.   Plan:   Patient Instructions  Check blood sugars 1-2 x day before breakfast and 2 hrs after supper every day Bring blood sugar records to the next class Call your doctor for a prescription for: 1. Meter strips (type) Accu-Chek Guide checking 1-2 times per day 2. Lancets (type) Accu-Chek Fastclix checking  1-2   times per day Exercise: Walk as tolerated  Eat 3 meals day,   1-2  snacks a day Space meals 4-6 hours apart Include 1 serving of protein with breakfast Quit smoking Make an eye doctor appointment when blood sugars stable  Expected Outcomes:  Demonstrated interest in learning. Expect positive outcomes  Education material provided:  General Meal Planning Guidelines Simple Meal Plan Meter  Accu-Chek  Guide Me  If problems or questions, patient to contact team via:  [name] Day, [place], [place], CDE [phone]  Future DSME appointment: 4-6 wks  September 24, 2018 for Diabetes Class 1

## 2018-08-31 ENCOUNTER — Encounter: Payer: Self-pay | Admitting: Family Medicine

## 2018-08-31 ENCOUNTER — Other Ambulatory Visit: Payer: Self-pay

## 2018-08-31 ENCOUNTER — Ambulatory Visit: Payer: Medicaid Other | Admitting: Family Medicine

## 2018-08-31 VITALS — BP 138/87 | HR 96 | Temp 98.0°F | Ht 72.0 in | Wt 309.0 lb

## 2018-08-31 DIAGNOSIS — Z1211 Encounter for screening for malignant neoplasm of colon: Secondary | ICD-10-CM

## 2018-08-31 DIAGNOSIS — E1165 Type 2 diabetes mellitus with hyperglycemia: Secondary | ICD-10-CM | POA: Diagnosis not present

## 2018-08-31 DIAGNOSIS — I10 Essential (primary) hypertension: Secondary | ICD-10-CM

## 2018-08-31 DIAGNOSIS — Z720 Tobacco use: Secondary | ICD-10-CM | POA: Diagnosis not present

## 2018-08-31 MED ORDER — METFORMIN HCL 1000 MG PO TABS: 1000.0000 mg | ORAL_TABLET | Freq: Two times a day (BID) | ORAL | 3 refills | Status: DC

## 2018-08-31 MED ORDER — SITAGLIPTIN PHOSPHATE 50 MG PO TABS: 50.0000 mg | ORAL_TABLET | Freq: Every day | ORAL | 2 refills | Status: DC

## 2018-08-31 MED ORDER — LOSARTAN POTASSIUM 50 MG PO TABS: 50.0000 mg | ORAL_TABLET | Freq: Every day | ORAL | 0 refills | Status: DC

## 2018-08-31 MED ORDER — BUPROPION HCL ER (XL) 150 MG PO TB24: 150.0000 mg | ORAL_TABLET | Freq: Every day | ORAL | 1 refills | Status: DC

## 2018-08-31 NOTE — Progress Notes (Signed)
BP 138/87   Pulse 96   Temp 98 F (36.7 C) (Oral)   Ht 6' (1.829 m)   Wt (!) 309 lb (140.2 kg)   SpO2 94%   BMI 41.91 kg/m    Subjective:    Patient ID: [name] Amy., male    DOB: May 05, 1960, 58 y.o.   MRN: [id number]  HPI: [name] Weckwerth. is a 58 y.o. male  Chief Complaint  Patient presents with  . Hypertension    f/u  . Referral    ophtalmologist    Here today for HTN and DM f/u.   Went and saw Nutritionist recently for diabetes education. Lost 13 lb prior to this appt and 4 more lb since this appt Monday. Has cut out sugary drinks and eating more veggies, smaller portions. Was unable to get insurance to approve [place] but is on metformin 500 mg BID. Does not check home BSs. Denies low blood sugar spells.   Insurance would not cover his losartan so he was unable to start on it. Has not been checking BPs regularly at home. Denies CP, SOB, HAs, dizziness.   Has tried OTC gums and patches to quit smoking in the past which didn't seem to help much. Very ready to quit, wanting to continue making good changes to benefit his health.   Relevant past medical, surgical, family and social history reviewed and updated as indicated. Interim medical history since our last visit reviewed. Allergies and medications reviewed and updated.  Review of Systems  Per HPI unless specifically indicated above     Objective:    BP 138/87   Pulse 96   Temp 98 F (36.7 C) (Oral)   Ht 6' (1.829 m)   Wt (!) 309 lb (140.2 kg)   SpO2 94%   BMI 41.91 kg/m   Wt Readings from Last 3 Encounters:  08/31/18 (!) 309 lb (140.2 kg)  08/28/18 (!) 313 lb 11.2 oz (142.3 kg)  06/04/18 (!) 328 lb (148.8 kg)    Physical Exam Vitals signs and nursing note reviewed.  Constitutional:      Appearance: Normal appearance.  HENT:     Head: Atraumatic.  Eyes:     Extraocular Movements: Extraocular movements intact.     Conjunctiva/sclera: Conjunctivae normal.  Neck:   Musculoskeletal: Normal range of motion and neck supple.  Cardiovascular:     Rate and Rhythm: Normal rate and regular rhythm.  Pulmonary:     Effort: Pulmonary effort is normal.     Breath sounds: Normal breath sounds.  Musculoskeletal: Normal range of motion.  Skin:    General: Skin is warm and dry.  Neurological:     General: No focal deficit present.     Mental Status: He is oriented to person, place, and time.  Psychiatric:        Mood and Affect: Mood normal.        Thought Content: Thought content normal.        Judgment: Judgment normal.     Results for orders placed or performed in visit on 08/03/18  Comprehensive metabolic panel  Result Value Ref Range   Glucose 331 (H) 65 - 99 mg/dL   BUN 9 6 - 24 mg/dL   Creatinine, Ser 0.55 (L) 0.76 - 1.27 mg/dL   GFR calc non Af Amer 116 >59 mL/min/1.73   GFR calc Af Amer 135 >59 mL/min/1.73   BUN/Creatinine Ratio 16 9 - 20   Sodium 137 134 - 144  mmol/L   Potassium 4.3 3.5 - 5.2 mmol/L   Chloride 96 96 - 106 mmol/L   CO2 23 20 - 29 mmol/L   Calcium 9.2 8.7 - 10.2 mg/dL   Total Protein 7.1 6.0 - 8.5 g/dL   Albumin 4.5 3.8 - 4.9 g/dL   Globulin, Total 2.6 1.5 - 4.5 g/dL   Albumin/Globulin Ratio 1.7 1.2 - 2.2   Bilirubin Total 0.3 0.0 - 1.2 mg/dL   Alkaline Phosphatase 82 39 - 117 IU/L   AST 27 0 - 40 IU/L   ALT 33 0 - 44 IU/L  Lipid Panel w/o Chol/HDL Ratio  Result Value Ref Range   Cholesterol, Total 171 100 - 199 mg/dL   Triglycerides 388 (H) 0 - 149 mg/dL   HDL 30 (L) >39 mg/dL   VLDL Cholesterol Cal 78 (H) 5 - 40 mg/dL   LDL Calculated 63 0 - 99 mg/dL  HgB A1c  Result Value Ref Range   Hgb A1c MFr Bld 11.4 (H) 4.8 - 5.6 %   Est. average glucose Bld gHb Est-mCnc 280 mg/dL      Assessment & Plan:   Problem List Items Addressed This Visit      Cardiovascular and Mediastinum   Essential hypertension    BPs WNL today without addition of losartan yet due to coverage issues. Possible that his weight loss has  stabilized his BPs. Start home checks and logging readings. Resend losartan. Continue good lifestyle choices.       Relevant Medications   losartan (COZAAR) 50 MG tablet     Endocrine   Type 2 diabetes mellitus with hyperglycemia ([place]) - Primary    Congratulated excellent lifestyle changes and weight loss. Will increase metformin to 1000 mg BID and resend [place]. Recheck A1C in 2 months.       Relevant Medications   losartan (COZAAR) 50 MG tablet   sitaGLIPtin (JANUVIA) 50 MG tablet   metFORMIN (GLUCOPHAGE) 1000 MG tablet     Other   Tobacco abuse    Agreeable to starting wellbutrin for smoking cessation. Signed up for the free classes at [facility] for cessation as well.        Other Visit Diagnoses    Colon cancer screening       Relevant Orders   Ambulatory referral to Gastroenterology       Follow up plan: Return in about 2 months (around 10/31/2018) for BP, DM.

## 2018-09-05 ENCOUNTER — Other Ambulatory Visit: Payer: Self-pay

## 2018-09-05 ENCOUNTER — Telehealth: Payer: Self-pay

## 2018-09-05 DIAGNOSIS — Z1211 Encounter for screening for malignant neoplasm of colon: Secondary | ICD-10-CM

## 2018-09-05 NOTE — Telephone Encounter (Signed)
Gastroenterology Pre-Procedure Review  Request Date: 09/27/18 Requesting Physician: Dr.Wohl  PATIENT REVIEW QUESTIONS: The patient responded to the following health history questions as indicated:    1. Are you having any GI issues? no 2. Do you have a personal history of Polyps?no 3. Do you have a family history of Colon Cancer or Polyps? no 4. Diabetes Mellitus? yes 5. Joint replacements in the past 12 months?no 6. Major health problems in the past 3 months?no 7. Any artificial heart valves, MVP, or defibrillator?no    MEDICATIONS & ALLERGIES:    Patient reports the following regarding taking any anticoagulation/antiplatelet therapy:   Plavix, Coumadin, Eliquis, Xarelto, Lovenox, Pradaxa, Brilinta, or Effient? No Aspirin?No  Patient confirms/reports the following medications:  Current Outpatient Medications  Medication Sig Dispense Refill  . acetaminophen (TYLENOL) 500 MG tablet Take 500 mg by mouth every 6 (six) hours as needed.    Marland Kitchen buPROPion (WELLBUTRIN XL) 150 MG 24 hr tablet Take 1 tablet (150 mg total) by mouth daily. 30 tablet 1  . [START ON 09/09/2018] gabapentin (NEURONTIN) 600 MG tablet 1200 mg  BID 120 tablet 2  . hydrOXYzine (ATARAX/VISTARIL) 25 MG tablet Take 1 tablet (25 mg total) by mouth 3 (three) times daily as needed. 90 tablet 0  . losartan (COZAAR) 50 MG tablet Take 1 tablet (50 mg total) by mouth daily. 30 tablet 0  . metFORMIN (GLUCOPHAGE) 1000 MG tablet Take 1 tablet (1,000 mg total) by mouth 2 (two) times daily with a meal. 180 tablet 3  . [START ON 11/08/2018] Oxycodone HCl 10 MG TABS Take 1 tablet (10 mg total) by mouth every 6 (six) hours as needed for up to 30 days. 120 tablet 0  . [START ON 09/09/2018] Oxycodone HCl 10 MG TABS Take 1 tablet (10 mg total) by mouth every 6 (six) hours as needed for up to 30 days. 120 tablet 0  . sitaGLIPtin (JANUVIA) 50 MG tablet Take 1 tablet (50 mg total) by mouth daily. 30 tablet 2  . sitaGLIPtin-metformin (JANUMET) 50-500  MG tablet Take 1 tablet by mouth 2 (two) times daily with a meal. (Patient not taking: Reported on 08/28/2018) 60 tablet 0  . [START ON 09/09/2018] tiZANidine (ZANAFLEX) 4 MG tablet Take 1 tablet (4 mg total) by mouth 3 (three) times daily. 90 tablet 2   No current facility-administered medications for this visit.     Patient confirms/reports the following allergies:  Allergies  Allergen Reactions  . Tramadol Diarrhea    Severe Headaches    No orders of the defined types were placed in this encounter.   AUTHORIZATION INFORMATION Primary Insurance: 1D#: Group #:  Secondary Insurance: 1D#: Group #:  SCHEDULE INFORMATION: Date: 09/27/18 Time: Location:[facility]

## 2018-09-06 DIAGNOSIS — E1165 Type 2 diabetes mellitus with hyperglycemia: Secondary | ICD-10-CM | POA: Insufficient documentation

## 2018-09-06 DIAGNOSIS — R809 Proteinuria, unspecified: Secondary | ICD-10-CM | POA: Insufficient documentation

## 2018-09-06 NOTE — Assessment & Plan Note (Signed)
BPs WNL today without addition of losartan yet due to coverage issues. Possible that his weight loss has stabilized his BPs. Start home checks and logging readings. Resend losartan. Continue good lifestyle choices.

## 2018-09-06 NOTE — Assessment & Plan Note (Signed)
Congratulated excellent lifestyle changes and weight loss. Will increase metformin to 1000 mg BID and resend [place]. Recheck A1C in 2 months.

## 2018-09-06 NOTE — Assessment & Plan Note (Signed)
Agreeable to starting wellbutrin for smoking cessation. Signed up for the free classes at [facility] for cessation as well.

## 2018-09-24 ENCOUNTER — Other Ambulatory Visit: Payer: Self-pay

## 2018-09-24 ENCOUNTER — Encounter: Payer: Self-pay | Admitting: Dietician

## 2018-09-24 ENCOUNTER — Other Ambulatory Visit
Admission: RE | Admit: 2018-09-24 | Discharge: 2018-09-24 | Disposition: A | Discharge status: Home / Self Care | Payer: Medicaid Other | Source: Ambulatory Visit | Attending: Gastroenterology | Admitting: Gastroenterology

## 2018-09-24 DIAGNOSIS — Z1159 Encounter for screening for other viral diseases: Secondary | ICD-10-CM | POA: Diagnosis not present

## 2018-09-24 NOTE — Progress Notes (Signed)
Patient did not come to Diabetes Class 1 today as scheduled. Will attempt to reach patient to reschedule.

## 2018-09-25 LAB — NOVEL CORONAVIRUS, NAA (HOSP ORDER, SEND-OUT TO REF LAB; TAT 18-24 HRS): SARS-CoV-2, NAA: NOT DETECTED

## 2018-09-26 ENCOUNTER — Telehealth: Payer: Self-pay | Admitting: Gastroenterology

## 2018-09-26 ENCOUNTER — Telehealth: Payer: Self-pay | Admitting: Dietician

## 2018-09-26 NOTE — Telephone Encounter (Signed)
Pt is calling he has a procedure tomorrow and has not received his package with instructions pt states he has been on low fiber diet and has had his covid19 testing please call [phone]

## 2018-09-26 NOTE — Telephone Encounter (Signed)
Tati has provided patient with his colonoscopy instructions.  She said he plans to pick them up at the front office along with the prescription for his bowel prep.  Thanks Corlene Sabia

## 2018-09-26 NOTE — Discharge Instructions (Signed)
General Anesthesia, Adult, Care After  This sheet gives you information about how to care for yourself after your procedure. Your health care provider may also give you more specific instructions. If you have problems or questions, contact your health care provider.  What can I expect after the procedure?  After the procedure, the following side effects are common:  Pain or discomfort at the IV site.  Nausea.  Vomiting.  Sore throat.  Trouble concentrating.  Feeling cold or chills.  Weak or tired.  Sleepiness and fatigue.  Soreness and body aches. These side effects can affect parts of the body that were not involved in surgery.  Follow these instructions at home:    For at least 24 hours after the procedure:  Have a responsible adult stay with you. It is important to have someone help care for you until you are awake and alert.  Rest as needed.  Do not:  Participate in activities in which you could fall or become injured.  Drive.  Use heavy machinery.  Drink alcohol.  Take sleeping pills or medicines that cause drowsiness.  Make important decisions or sign legal documents.  Take care of children on your own.  Eating and drinking  Follow any instructions from your health care provider about eating or drinking restrictions.  When you feel hungry, start by eating small amounts of foods that are soft and easy to digest (bland), such as toast. Gradually return to your regular diet.  Drink enough fluid to keep your urine pale yellow.  If you vomit, rehydrate by drinking water, juice, or clear broth.  General instructions  If you have sleep apnea, surgery and certain medicines can increase your risk for breathing problems. Follow instructions from your health care provider about wearing your sleep device:  Anytime you are sleeping, including during daytime naps.  While taking prescription pain medicines, sleeping medicines, or medicines that make you drowsy.  Return to your normal activities as told by your health care  provider. Ask your health care provider what activities are safe for you.  Take over-the-counter and prescription medicines only as told by your health care provider.  If you smoke, do not smoke without supervision.  Keep all follow-up visits as told by your health care provider. This is important.  Contact a health care provider if:  You have nausea or vomiting that does not get better with medicine.  You cannot eat or drink without vomiting.  You have pain that does not get better with medicine.  You are unable to pass urine.  You develop a skin rash.  You have a fever.  You have redness around your IV site that gets worse.  Get help right away if:  You have difficulty breathing.  You have chest pain.  You have blood in your urine or stool, or you vomit blood.  Summary  After the procedure, it is common to have a sore throat or nausea. It is also common to feel tired.  Have a responsible adult stay with you for the first 24 hours after general anesthesia. It is important to have someone help care for you until you are awake and alert.  When you feel hungry, start by eating small amounts of foods that are soft and easy to digest (bland), such as toast. Gradually return to your regular diet.  Drink enough fluid to keep your urine pale yellow.  Return to your normal activities as told by your health care provider. Ask your health care   provider what activities are safe for you.  This information is not intended to replace advice given to you by your health care provider. Make sure you discuss any questions you have with your health care provider.  Document Released: 07/04/2000 Document Revised: 11/11/2016 Document Reviewed: 11/11/2016  Elsevier Interactive Patient Education  2019 [employer]

## 2018-09-26 NOTE — Telephone Encounter (Signed)
Called patient to reschedule diabetes class series, as he missed class 1 on 09/24/18. He will come to the next series, beginning 10/22/18.

## 2018-09-27 ENCOUNTER — Ambulatory Visit: Payer: Medicaid Other | Admitting: Anesthesiology

## 2018-09-27 ENCOUNTER — Encounter
Admission: RE | Disposition: A | Discharge status: Home / Self Care | Payer: Self-pay | Source: Home / Self Care | Attending: Gastroenterology

## 2018-09-27 ENCOUNTER — Other Ambulatory Visit: Payer: Self-pay

## 2018-09-27 ENCOUNTER — Ambulatory Visit
Admission: RE | Admit: 2018-09-27 | Discharge: 2018-09-27 | Disposition: A | Discharge status: Home / Self Care | Payer: Medicaid Other | Attending: Gastroenterology | Admitting: Gastroenterology

## 2018-09-27 DIAGNOSIS — M1990 Unspecified osteoarthritis, unspecified site: Secondary | ICD-10-CM | POA: Diagnosis not present

## 2018-09-27 DIAGNOSIS — D128 Benign neoplasm of rectum: Secondary | ICD-10-CM | POA: Diagnosis not present

## 2018-09-27 DIAGNOSIS — F329 Major depressive disorder, single episode, unspecified: Secondary | ICD-10-CM | POA: Diagnosis not present

## 2018-09-27 DIAGNOSIS — Z79899 Other long term (current) drug therapy: Secondary | ICD-10-CM | POA: Insufficient documentation

## 2018-09-27 DIAGNOSIS — Z820 Family history of epilepsy and other diseases of the nervous system: Secondary | ICD-10-CM | POA: Insufficient documentation

## 2018-09-27 DIAGNOSIS — G546 Phantom limb syndrome with pain: Secondary | ICD-10-CM | POA: Insufficient documentation

## 2018-09-27 DIAGNOSIS — Z809 Family history of malignant neoplasm, unspecified: Secondary | ICD-10-CM | POA: Insufficient documentation

## 2018-09-27 DIAGNOSIS — Z89519 Acquired absence of unspecified leg below knee: Secondary | ICD-10-CM | POA: Insufficient documentation

## 2018-09-27 DIAGNOSIS — K621 Rectal polyp: Secondary | ICD-10-CM

## 2018-09-27 DIAGNOSIS — Z888 Allergy status to other drugs, medicaments and biological substances status: Secondary | ICD-10-CM | POA: Insufficient documentation

## 2018-09-27 DIAGNOSIS — R0600 Dyspnea, unspecified: Secondary | ICD-10-CM | POA: Diagnosis not present

## 2018-09-27 DIAGNOSIS — Z833 Family history of diabetes mellitus: Secondary | ICD-10-CM | POA: Diagnosis not present

## 2018-09-27 DIAGNOSIS — D125 Benign neoplasm of sigmoid colon: Secondary | ICD-10-CM | POA: Diagnosis not present

## 2018-09-27 DIAGNOSIS — F410 Panic disorder [episodic paroxysmal anxiety] without agoraphobia: Secondary | ICD-10-CM | POA: Diagnosis not present

## 2018-09-27 DIAGNOSIS — Z8249 Family history of ischemic heart disease and other diseases of the circulatory system: Secondary | ICD-10-CM | POA: Insufficient documentation

## 2018-09-27 DIAGNOSIS — Z1211 Encounter for screening for malignant neoplasm of colon: Secondary | ICD-10-CM

## 2018-09-27 DIAGNOSIS — Z7984 Long term (current) use of oral hypoglycemic drugs: Secondary | ICD-10-CM | POA: Insufficient documentation

## 2018-09-27 DIAGNOSIS — E119 Type 2 diabetes mellitus without complications: Secondary | ICD-10-CM | POA: Diagnosis not present

## 2018-09-27 DIAGNOSIS — F17210 Nicotine dependence, cigarettes, uncomplicated: Secondary | ICD-10-CM | POA: Insufficient documentation

## 2018-09-27 DIAGNOSIS — K635 Polyp of colon: Secondary | ICD-10-CM | POA: Diagnosis not present

## 2018-09-27 HISTORY — DX: Unspecified osteoarthritis, unspecified site: M19.90

## 2018-09-27 HISTORY — PX: POLYPECTOMY: SHX5525

## 2018-09-27 HISTORY — PX: COLONOSCOPY WITH PROPOFOL: SHX5780

## 2018-09-27 HISTORY — DX: Dyspnea, unspecified: R06.00

## 2018-09-27 HISTORY — DX: Presence of dental prosthetic device (complete) (partial): Z97.2

## 2018-09-27 LAB — GLUCOSE, CAPILLARY
Glucose-Capillary: 125 mg/dL — ABNORMAL HIGH (ref 70–99)
Glucose-Capillary: 126 mg/dL — ABNORMAL HIGH (ref 70–99)

## 2018-09-27 SURGERY — COLONOSCOPY WITH PROPOFOL
Anesthesia: General | Site: Rectum

## 2018-09-27 MED ORDER — SODIUM CHLORIDE 0.9 % IV SOLN: INTRAVENOUS | Status: DC

## 2018-09-27 MED ORDER — LACTATED RINGERS IV SOLN
INTRAVENOUS | Status: DC | PRN
  Administered 2018-09-27: 11:00:00 via INTRAVENOUS

## 2018-09-27 MED ORDER — PROPOFOL 10 MG/ML IV BOLUS
INTRAVENOUS | Status: DC | PRN
  Administered 2018-09-27 (×3): 50 mg via INTRAVENOUS
  Administered 2018-09-27: 20 mg via INTRAVENOUS
  Administered 2018-09-27: 100 mg via INTRAVENOUS
  Administered 2018-09-27 (×3): 50 mg via INTRAVENOUS
  Administered 2018-09-27: 30 mg via INTRAVENOUS
  Administered 2018-09-27: 50 mg via INTRAVENOUS

## 2018-09-27 MED ORDER — LACTATED RINGERS IV SOLN: INTRAVENOUS | Status: DC

## 2018-09-27 MED ORDER — ONDANSETRON HCL 4 MG/2ML IJ SOLN: 4.0000 mg | Freq: Once | INTRAMUSCULAR | Status: DC | PRN

## 2018-09-27 MED ORDER — LIDOCAINE HCL (CARDIAC) PF 100 MG/5ML IV SOSY
PREFILLED_SYRINGE | INTRAVENOUS | Status: DC | PRN
  Administered 2018-09-27: 40 mg via INTRAVENOUS

## 2018-09-27 MED ORDER — STERILE WATER FOR IRRIGATION IR SOLN
Status: DC | PRN
  Administered 2018-09-27: 15 mL

## 2018-09-27 SURGICAL SUPPLY — 7 items
CANISTER SUCT 1200ML W/VALVE (MISCELLANEOUS) ×3 IMPLANT
GOWN CVR UNV OPN BCK APRN NK (MISCELLANEOUS) ×2 IMPLANT
GOWN ISOL THUMB LOOP REG UNIV (MISCELLANEOUS) ×4
KIT ENDO PROCEDURE OLY (KITS) ×3 IMPLANT
SNARE SHORT THROW 13M SML OVAL (MISCELLANEOUS) ×3 IMPLANT
TRAP ETRAP POLY (MISCELLANEOUS) ×3 IMPLANT
WATER STERILE IRR 250ML POUR (IV SOLUTION) ×3 IMPLANT

## 2018-09-27 NOTE — Anesthesia Procedure Notes (Signed)
Procedure Name: MAC Date/Time: 09/27/2018 11:26 AM Performed by: Janna Arch, CRNA Pre-anesthesia Checklist: Patient identified, Emergency Drugs available, Suction available, Timeout performed and Patient being monitored Patient Re-evaluated:Patient Re-evaluated prior to induction Oxygen Delivery Method: Nasal cannula Placement Confirmation: positive ETCO2

## 2018-09-27 NOTE — Anesthesia Postprocedure Evaluation (Signed)
Anesthesia Post Note  Patient: [name] Day.  Procedure(s) Performed: COLONOSCOPY WITH BIOPSY (N/A Rectum) POLYPECTOMY (N/A Rectum)  Patient location during evaluation: PACU Anesthesia Type: General Level of consciousness: awake Pain management: pain level controlled Vital Signs Assessment: post-procedure vital signs reviewed and stable Respiratory status: respiratory function stable Cardiovascular status: stable Postop Assessment: no signs of nausea or vomiting Anesthetic complications: no    Veda Canning

## 2018-09-27 NOTE — Anesthesia Preprocedure Evaluation (Addendum)
Anesthesia Evaluation  Patient identified by MRN, date of birth, ID band Patient awake    Reviewed: Allergy & Precautions, NPO status , Patient's Chart, lab work & pertinent test results  Airway Mallampati: II  TM Distance: >3 FB     Dental   Pulmonary Current Smoker,    breath sounds clear to auscultation       Cardiovascular hypertension,  Rhythm:Regular Rate:Normal     Neuro/Psych Anxiety Depression Phantom limb pain    GI/Hepatic   Endo/Other  diabetes, Type 2, Oral Hypoglycemic AgentsObesity - BMI 41  Renal/GU      Musculoskeletal  (+) Arthritis , S/p left BKA   Abdominal   Peds  Hematology   Anesthesia Other Findings   Reproductive/Obstetrics                            Anesthesia Physical Anesthesia Plan  ASA: III  Anesthesia Plan: General   Post-op Pain Management:    Induction: Intravenous  PONV Risk Score and Plan: TIVA  Airway Management Planned: Natural Airway and Nasal Cannula  Additional Equipment:   Intra-op Plan:   Post-operative Plan:   Informed Consent: I have reviewed the patients History and Physical, chart, labs and discussed the procedure including the risks, benefits and alternatives for the proposed anesthesia with the patient or authorized representative who has indicated his/her understanding and acceptance.       Plan Discussed with: CRNA  Anesthesia Plan Comments:         Anesthesia Quick Evaluation

## 2018-09-27 NOTE — Transfer of Care (Signed)
Immediate Anesthesia Transfer of Care Note  Patient: [name] Day.  Procedure(s) Performed: COLONOSCOPY WITH BIOPSY (N/A Rectum) POLYPECTOMY (N/A Rectum)  Patient Location: PACU  Anesthesia Type: General  Level of Consciousness: awake, alert  and patient cooperative  Airway and Oxygen Therapy: Patient Spontanous Breathing and Patient connected to supplemental oxygen  Post-op Assessment: Post-op Vital signs reviewed, Patient's Cardiovascular Status Stable, Respiratory Function Stable, Patent Airway and No signs of Nausea or vomiting  Post-op Vital Signs: Reviewed and stable  Complications: No apparent anesthesia complications

## 2018-09-27 NOTE — Op Note (Signed)
[facility]astro[facility] Patient Name: [name] Day Procedure Date: 09/27/2018 11:22 AM MRN: [id number] Account #: [id number] Date of Birth: Jun 18, 1960 Admit Type: Outpatient Age: 58 Room: [facility] OR ROOM 01 Gender: Male Note Status: Finalized Procedure:            Colonoscopy Indications:          Screening for colorectal malignant neoplasm Providers:            Lucilla Lame MD, MD Referring MD:         Volney American (Referring MD) Medicines:            Propofol per Anesthesia Complications:        No immediate complications. Procedure:            Pre-Anesthesia Assessment:                       - Prior to the procedure, a History and Physical was                        performed, and patient medications and allergies were                        reviewed. The patient's tolerance of previous                        anesthesia was also reviewed. The risks and benefits of                        the procedure and the sedation options and risks were                        discussed with the patient. All questions were                        answered, and informed consent was obtained. Prior                        Anticoagulants: The patient has taken no previous                        anticoagulant or antiplatelet agents. ASA Grade                        Assessment: II - A patient with mild systemic disease.                        After reviewing the risks and benefits, the patient was                        deemed in satisfactory condition to undergo the                        procedure.                       After obtaining informed consent, the colonoscope was                        passed under direct vision. Throughout the procedure,  the patient's blood pressure, pulse, and oxygen                        saturations were monitored continuously. The                        Colonoscope was introduced through the anus and                advanced to the the cecum, identified by appendiceal                        orifice and ileocecal valve. The colonoscopy was                        performed without difficulty. The patient tolerated the                        procedure well. The quality of the bowel preparation                        was excellent. Findings:      The perianal and digital rectal examinations were normal.      Two sessile polyps were found in the sigmoid colon. The polyps were 3 to       4 mm in size. These polyps were removed with a cold snare. Resection and       retrieval were complete.      Two sessile polyps were found in the rectum. The polyps were 4 to 6 mm       in size. These polyps were removed with a cold snare. Resection and       retrieval were complete. Impression:           - Two 3 to 4 mm polyps in the sigmoid colon, removed                        with a cold snare. Resected and retrieved.                       - Two 4 to 6 mm polyps in the rectum, removed with a                        cold snare. Resected and retrieved. Recommendation:       - Discharge patient to home.                       - Resume previous diet.                       - Continue present medications.                       - Await pathology results.                       - Repeat colonoscopy in 5 years if polyp adenoma and 10                        years if hyperplastic Procedure Code(s):    --- Professional ---  45385, Colonoscopy, flexible; with removal of tumor(s),                        polyp(s), or other lesion(s) by snare technique Diagnosis Code(s):    --- Professional ---                       Z12.11, Encounter for screening for malignant neoplasm                        of colon                       K63.5, Polyp of colon                       K62.1, Rectal polyp CPT copyright 2019 American Medical Association. All rights reserved. The codes documented in this report are  preliminary and upon coder review may  be revised to meet current compliance requirements. Lucilla Lame MD, MD 09/27/2018 11:46:13 AM This report has been signed electronically. Number of Addenda: 0 Note Initiated On: 09/27/2018 11:22 AM Scope Withdrawal Time: 0 hours 7 minutes 37 seconds  Total Procedure Duration: 0 hours 11 minutes 10 seconds  Estimated Blood Loss: Estimated blood loss: none.      [facility]

## 2018-09-27 NOTE — H&P (Signed)
Lucilla Lame, MD [address] [place], [place] [address] Phone: [phone] Fax : [phone]  Primary Care Physician:  Volney American, [place] Primary Gastroenterologist:  Dr. Allen Norris  Pre-Procedure History & Physical: HPI:  [name] Day. is a 58 y.o. male is here for a screening colonoscopy.   Past Medical History:  Diagnosis Date  . Arthritis    hands  . Depression   . Diabetes mellitus without complication ([place])   . Dyspnea   . Hx of BKA ([place])    due to complicated fracture wears prosthesis  . Panic attack   . Wears dentures    upper and lower full plate    Past Surgical History:  Procedure Laterality Date  . HAND SURGERY    . LEG AMPUTATION    . LEG SURGERY      Prior to Admission medications   Medication Sig Start Date End Date Taking? Authorizing Provider  acetaminophen (TYLENOL) 500 MG tablet Take 500 mg by mouth every 6 (six) hours as needed.   Yes [provider]  buPROPion (WELLBUTRIN XL) 150 MG 24 hr tablet Take 1 tablet (150 mg total) by mouth daily. 08/31/18  Yes Volney American, PA-C  gabapentin (NEURONTIN) 600 MG tablet 1200 mg  BID 09/09/18  Yes Vevelyn Francois, NP  hydrOXYzine (ATARAX/VISTARIL) 25 MG tablet Take 1 tablet (25 mg total) by mouth 3 (three) times daily as needed. 08/03/18  Yes Volney American, PA-C  metFORMIN (GLUCOPHAGE) 1000 MG tablet Take 1 tablet (1,000 mg total) by mouth 2 (two) times daily with a meal. 08/31/18  Yes Volney American, PA-C  Oxycodone HCl 10 MG TABS Take 1 tablet (10 mg total) by mouth every 6 (six) hours as needed for up to 30 days. 11/08/18 12/08/18 Yes King, Diona Foley, NP  tiZANidine (ZANAFLEX) 4 MG tablet Take 1 tablet (4 mg total) by mouth 3 (three) times daily. 09/09/18 12/08/18 Yes Vevelyn Francois, NP  losartan (COZAAR) 50 MG tablet Take 1 tablet (50 mg total) by mouth daily. Patient not taking: Reported on 09/20/2018 08/31/18   Volney American, PA-C   Oxycodone HCl 10 MG TABS Take 1 tablet (10 mg total) by mouth every 6 (six) hours as needed for up to 30 days. Patient not taking: Reported on 09/20/2018 09/09/18 10/09/18  Vevelyn Francois, NP  sitaGLIPtin (JANUVIA) 50 MG tablet Take 1 tablet (50 mg total) by mouth daily. Patient not taking: Reported on 09/20/2018 08/31/18   Volney American, PA-C    Allergies as of 09/05/2018 - Review Complete 08/31/2018  Allergen Reaction Noted  . Tramadol Diarrhea 01/01/2017    Family History  Problem Relation Age of Onset  . Cancer Mother        Mastatic  . Diabetes Father   . Hypertension Father   . Heart disease Father   . Alzheimer's disease Maternal Grandmother   . Cancer Maternal Grandfather   . Cancer Paternal Grandmother     Social History   Socioeconomic History  . Marital status: Married    Spouse name: Not on file  . Number of children: Not on file  . Years of education: Not on file  . Highest education level: Not on file  Occupational History  . Not on file  Social Needs  . Financial resource strain: Not on file  . Food insecurity    Worry: Not on file    Inability: Not on file  . Transportation needs  Medical: Not on file    Non-medical: Not on file  Tobacco Use  . Smoking status: Current Every Day Smoker    Packs/day: 1.00    Years: 45.00    Pack years: 45.00    Types: Cigarettes  . Smokeless tobacco: Never Used  Substance and Sexual Activity  . Alcohol use: No  . Drug use: No  . Sexual activity: Not on file  Lifestyle  . Physical activity    Days per week: Not on file    Minutes per session: Not on file  . Stress: Not on file  Relationships  . Social Herbalist on phone: Not on file    Gets together: Not on file    Attends religious service: Not on file    Active member of club or organization: Not on file    Attends meetings of clubs or organizations: Not on file    Relationship status: Not on file  . Intimate partner violence    Fear  of current or ex partner: Not on file    Emotionally abused: Not on file    Physically abused: Not on file    Forced sexual activity: Not on file  Other Topics Concern  . Not on file  Social History Narrative  . Not on file    Review of Systems: See HPI, otherwise negative ROS  Physical Exam: BP 124/83   Pulse 90   Temp (!) 97.5 F (36.4 C) (Temporal)   Ht 6' (1.829 m)   Wt (!) 137.4 kg   SpO2 97%   BMI 41.09 kg/m  General:   Alert,  pleasant and cooperative in NAD Head:  Normocephalic and atraumatic. Neck:  Supple; no masses or thyromegaly. Lungs:  Clear throughout to auscultation.    Heart:  Regular rate and rhythm. Abdomen:  Soft, nontender and nondistended. Normal bowel sounds, without guarding, and without rebound.   Neurologic:  Alert and  oriented x4;  grossly normal neurologically.  Impression/Plan: [name] Day. is now here to undergo a screening colonoscopy.  Risks, benefits, and alternatives regarding colonoscopy have been reviewed with the patient.  Questions have been answered.  All parties agreeable.

## 2018-09-28 ENCOUNTER — Encounter: Payer: Self-pay | Admitting: Gastroenterology

## 2018-10-01 ENCOUNTER — Ambulatory Visit: Payer: Medicaid Other

## 2018-10-02 ENCOUNTER — Encounter: Payer: Self-pay | Admitting: Gastroenterology

## 2018-10-03 ENCOUNTER — Encounter: Payer: Self-pay | Admitting: Gastroenterology

## 2018-10-04 ENCOUNTER — Telehealth: Payer: Self-pay | Admitting: Family Medicine

## 2018-10-04 NOTE — Telephone Encounter (Signed)
Please get an order going for these supplies.   Copied from [place] [phone]. Topic: Quick Communication - See Telephone Encounter >> Oct 04, 2018 10:01 AM Loma Boston wrote: CRM for notification. See Telephone encounter for: 10/04/18. [facility] ([place]) called and she had a visit with pt and he was telling her how expensive all his meds are.Marland KitchenHe needs a script for a meter, strips, needles etc. He has been paying everything up front out of his pocket. Please get all set up on RX. Send to [id number] - [place], [place] (Phone) [phone] (Fax)

## 2018-10-04 NOTE — Telephone Encounter (Signed)
Form filled out and placed in [name] Day's folder for signature. Will fax once form is signed.

## 2018-10-08 ENCOUNTER — Ambulatory Visit: Payer: Medicaid Other

## 2018-10-09 ENCOUNTER — Telehealth: Payer: Self-pay | Admitting: Family Medicine

## 2018-10-09 NOTE — Telephone Encounter (Signed)
[place] [facility] is calling medicaid is covering bp cuff due to corona virus. Pt fax [facility] [phone] and phone number [phone]. Dx code for bp machine is [phone]

## 2018-10-10 NOTE — Telephone Encounter (Signed)
Rx faxed and confirmed.

## 2018-10-10 NOTE — Telephone Encounter (Signed)
Rx signed and ready to fax

## 2018-10-22 ENCOUNTER — Encounter: Payer: Self-pay | Admitting: Dietician

## 2018-10-22 ENCOUNTER — Encounter: Payer: Medicaid Other | Attending: Family Medicine

## 2018-10-22 ENCOUNTER — Other Ambulatory Visit: Payer: Self-pay

## 2018-10-22 DIAGNOSIS — E119 Type 2 diabetes mellitus without complications: Secondary | ICD-10-CM | POA: Insufficient documentation

## 2018-10-22 DIAGNOSIS — Z713 Dietary counseling and surveillance: Secondary | ICD-10-CM | POA: Insufficient documentation

## 2018-10-22 DIAGNOSIS — Z6841 Body Mass Index (BMI) 40.0 and over, adult: Secondary | ICD-10-CM | POA: Insufficient documentation

## 2018-10-22 NOTE — Progress Notes (Signed)
Patient did not come to Diabetes class 1 as scheduled for today. This is second scheduled class series he has missed.

## 2018-10-30 ENCOUNTER — Encounter: Payer: Self-pay | Admitting: *Deleted

## 2018-10-31 ENCOUNTER — Ambulatory Visit: Payer: Medicaid Other | Admitting: Family Medicine

## 2018-11-03 ENCOUNTER — Other Ambulatory Visit: Payer: Self-pay | Admitting: Family Medicine

## 2018-11-03 NOTE — Telephone Encounter (Signed)
Requested Prescriptions  Pending Prescriptions Disp Refills  . losartan (COZAAR) 25 MG tablet [Pharmacy Med Name: LOSARTAN POTASSIUM 25 MG TAB] 60 tablet 0    Sig: TAKE 2 TABLETS BY MOUTH EVERY DAY     Cardiovascular:  Angiotensin Receptor Blockers Failed - 11/03/2018  5:00 PM      Failed - Cr in normal range and within 180 days    Creatinine  Date Value Ref Range Status  04/07/2012 0.74 0.60 - 1.30 mg/dL Final   Creatinine, Ser  Date Value Ref Range Status  08/03/2018 0.55 (L) 0.76 - 1.27 mg/dL Final         Passed - K in normal range and within 180 days    Potassium  Date Value Ref Range Status  08/03/2018 4.3 3.5 - 5.2 mmol/L Final  04/07/2012 4.2 3.5 - 5.1 mmol/L Final         Passed - Patient is not pregnant      Passed - Last BP in normal range    BP Readings from Last 1 Encounters:  09/27/18 108/79         Passed - Valid encounter within last 6 months    Recent Outpatient Visits          2 months ago Type 2 diabetes mellitus with hyperglycemia, without long-term current use of insulin [facility])   [place], [place], PA-C   3 months ago S/P BKA (below knee amputation) unilateral, left [facility])   [facility] Volney American, [place]   11 months ago Annual physical exam   [facility] Merrie Roof [place], [place]   1 year ago Chronic left hip pain   [facility] Merrie Roof [place], [place]

## 2018-11-05 ENCOUNTER — Telehealth: Payer: Self-pay | Admitting: Student in an Organized Health Care Education/Training Program

## 2018-11-05 NOTE — Telephone Encounter (Signed)
Pt called stating he is going out of town tomorrow and wants to know if he can fill his rx early. He states it is due to be filled on 730

## 2018-11-05 NOTE — Telephone Encounter (Signed)
Patient notified that authorization cannot be authorized for 3 day early refill.

## 2018-11-06 ENCOUNTER — Telehealth: Payer: Self-pay | Admitting: *Deleted

## 2018-11-27 ENCOUNTER — Encounter: Payer: Medicaid Other | Admitting: Student in an Organized Health Care Education/Training Program

## 2018-12-03 ENCOUNTER — Encounter: Payer: Self-pay | Admitting: Student in an Organized Health Care Education/Training Program

## 2018-12-04 ENCOUNTER — Other Ambulatory Visit: Payer: Self-pay

## 2018-12-04 ENCOUNTER — Ambulatory Visit
Payer: Medicaid Other | Attending: Student in an Organized Health Care Education/Training Program | Admitting: Student in an Organized Health Care Education/Training Program

## 2018-12-04 ENCOUNTER — Encounter: Payer: Self-pay | Admitting: Student in an Organized Health Care Education/Training Program

## 2018-12-04 DIAGNOSIS — M533 Sacrococcygeal disorders, not elsewhere classified: Secondary | ICD-10-CM | POA: Diagnosis not present

## 2018-12-04 DIAGNOSIS — Z89512 Acquired absence of left leg below knee: Secondary | ICD-10-CM

## 2018-12-04 DIAGNOSIS — G8929 Other chronic pain: Secondary | ICD-10-CM

## 2018-12-04 DIAGNOSIS — M47816 Spondylosis without myelopathy or radiculopathy, lumbar region: Secondary | ICD-10-CM

## 2018-12-04 DIAGNOSIS — G546 Phantom limb syndrome with pain: Secondary | ICD-10-CM

## 2018-12-04 DIAGNOSIS — G894 Chronic pain syndrome: Secondary | ICD-10-CM | POA: Diagnosis not present

## 2018-12-04 DIAGNOSIS — M7918 Myalgia, other site: Secondary | ICD-10-CM | POA: Diagnosis not present

## 2018-12-04 MED ORDER — OXYCODONE HCL 10 MG PO TABS: 10.0000 mg | ORAL_TABLET | Freq: Four times a day (QID) | ORAL | 0 refills | Status: DC | PRN

## 2018-12-04 MED ORDER — OXYCODONE HCL 10 MG PO TABS: 10.0000 mg | ORAL_TABLET | Freq: Four times a day (QID) | ORAL | 0 refills | Status: AC | PRN

## 2018-12-04 MED ORDER — TIZANIDINE HCL 4 MG PO TABS: 4.0000 mg | ORAL_TABLET | Freq: Three times a day (TID) | ORAL | 2 refills | Status: AC

## 2018-12-04 MED ORDER — GABAPENTIN 600 MG PO TABS: ORAL_TABLET | ORAL | 2 refills | Status: DC

## 2018-12-04 NOTE — Progress Notes (Signed)
Pain Management Virtual Encounter Note - Virtual Visit via [place] (real-time audio visits between healthcare provider and patient).   Patient's Phone No. & Preferred Pharmacy:  [phone] (home); [phone] (mobile); (Preferred) [phone] No e-mail address on record  [id number] [name] Day, [place] [address] [place] [address] Phone: [phone] Fax: [phone]    Pre-screening note:  Our staff contacted [name] Day and offered him an "in person", "face-to-face" appointment versus a telephone encounter. He indicated preferring the telephone encounter, at this time.   Reason for Virtual Visit: COVID-19*  Social distancing based on CDC and AMA recommendations.   I contacted [name] Day. on 12/04/2018 via video conference.      I clearly identified myself as [name] Santa, MD. I verified that I was speaking with the correct person using two identifiers (Name: [name] Day., and date of birth: 05/31/1960).  Advanced Informed Consent I sought verbal advanced consent from [name] Day. for virtual visit interactions. I informed [name] Day of possible security and privacy concerns, risks, and limitations associated with providing "not-in-person" medical evaluation and management services. I also informed [name] Day of the availability of "in-person" appointments. Finally, I informed him that there would be a charge for the virtual visit and that he could be  personally, fully or partially, financially responsible for it. [name] Day expressed understanding and agreed to proceed.   Historic Elements   [name] is a 58 y.o. year old, male patient evaluated today after his last encounter by our practice on 11/06/2018. [name] Day  has a past medical history of Arthritis, Depression, Diabetes mellitus without complication ([place]), Dyspnea, BKA ([place]), Panic attack, and Wears  dentures. He also  has a past surgical history that includes Leg amputation; Hand surgery; Leg Surgery; Colonoscopy with propofol (N/A, 09/27/2018); and polypectomy (N/A, 09/27/2018). [name] Day has a current medication list which includes the following prescription(s): acetaminophen, bupropion, gabapentin, hydroxyzine, metformin, oxycodone hcl, oxycodone hcl, oxycodone hcl, and tizanidine. He  reports that he has been smoking cigarettes. He has a 45.00 pack-year smoking history. He has never used smokeless tobacco. He reports that he does not drink alcohol or use drugs. [name] Day is allergic to tramadol.   HPI  Today, he is being contacted for medication management.   No change in medical history since last visit.  Patient's pain is at baseline.  Patient continues multimodal pain regimen as prescribed.  States that it provides pain relief and improvement in functional status.   Pharmacotherapy Assessment  Analgesic: Oxycodone 10 mg q6 hrs prn, #120/month. MME= 60  Monitoring: Pharmacotherapy: No side-effects or adverse reactions reported. [place] PMP: PDMP reviewed during this encounter.       Compliance: No problems identified. Effectiveness: Clinically acceptable. Plan: Refer to "POC".  UDS:  Summary  Date Value Ref Range Status  06/04/2018 FINAL  Final    Comment:    ==================================================================== TOXASSURE SELECT 13 (MW) ==================================================================== Test                             Result       Flag       Units Drug Present and Declared for Prescription Verification   Oxycodone                      2489         EXPECTED   ng/mg  creat   Oxymorphone                    4404         EXPECTED   ng/mg creat   Noroxycodone                   2340         EXPECTED   ng/mg creat   Noroxymorphone                 551          EXPECTED   ng/mg creat    Sources of oxycodone are scheduled prescription medications.     Oxymorphone, noroxycodone, and noroxymorphone are expected    metabolites of oxycodone. Oxymorphone is also available as a    scheduled prescription medication. ==================================================================== Test                      Result    Flag   Units      Ref Range   Creatinine              82               mg/dL      >=20 ==================================================================== Declared Medications:  The flagging and interpretation on this report are based on the  following declared medications.  Unexpected results may arise from  inaccuracies in the declared medications.  **Note: The testing scope of this panel includes these medications:  Oxycodone  **Note: The testing scope of this panel does not include following  reported medications:  Acetaminophen  Gabapentin  Naproxen (Aleve)  Tizanidine ==================================================================== For clinical consultation, please call [phone]. ====================================================================    Laboratory Chemistry Profile (12 mo)  Renal: 08/03/2018: BUN 9; BUN/Creatinine Ratio 16; Creatinine, Ser 0.55  Lab Results  Component Value Date   GFRAA 135 08/03/2018   GFRNONAA 116 08/03/2018   Hepatic: 08/03/2018: Albumin 4.5 Lab Results  Component Value Date   AST 27 08/03/2018   ALT 33 08/03/2018   Other: No results found for requested labs within last 8760 hours. Note: Above Lab results reviewed.  Imaging  Last 90 days:  No results found.  Assessment  The primary encounter diagnosis was Lumbar spondylosis. Diagnoses of Chronic pain syndrome, Chronic sacroiliac joint pain, 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HCC), Phantom limb pain (HCC), S/P BKA (below knee amputation) unilateral, left ([place]), and Chronic musculoskeletal pain were also pertinent to this visit.  Plan of Care  I have discontinued [name] Savers Jr.'s  sitaGLIPtin and losartan. I have also changed his Oxycodone HCl. Additionally, I am having him start on Oxycodone HCl and Oxycodone HCl. Lastly, I am having him maintain his acetaminophen, hydrOXYzine, metFORMIN, buPROPion, gabapentin, and tiZANidine.  Pharmacotherapy (Medications Ordered): Meds ordered this encounter  Medications  . Oxycodone HCl 10 MG TABS    Sig: Take 1 tablet (10 mg total) by mouth every 6 (six) hours as needed.    Dispense:  120 tablet    Refill:  0    Do not place this medication, or any other prescription from our practice, on "Automatic Refill". Patient may have prescription filled one day early if pharmacy is closed on scheduled refill date.  . Oxycodone HCl 10 MG TABS    Sig: Take 1 tablet (10 mg total) by mouth every 6 (six) hours as needed.    Dispense:  120 tablet    Refill:  0    Do not place this medication, or any other prescription from our practice, on "Automatic Refill". Patient may have prescription filled one day early if pharmacy is closed on scheduled refill date.  . Oxycodone HCl 10 MG TABS    Sig: Take 1 tablet (10 mg total) by mouth every 6 (six) hours as needed.    Dispense:  120 tablet    Refill:  0    Do not place this medication, or any other prescription from our practice, on "Automatic Refill". Patient may have prescription filled one day early if pharmacy is closed on scheduled refill date.  . gabapentin (NEURONTIN) 600 MG tablet    Sig: 1200 mg  BID    Dispense:  120 tablet    Refill:  2    Do not place this medication, or any other prescription from our practice, on "Automatic Refill". Patient may have prescription filled one day early if pharmacy is closed on scheduled refill date.  Marland Kitchen tiZANidine (ZANAFLEX) 4 MG tablet    Sig: Take 1 tablet (4 mg total) by mouth 3 (three) times daily.    Dispense:  90 tablet    Refill:  2   Orders:  No orders of the defined types were placed in this encounter.  Follow-up plan:   Return in about 3  months (around 03/06/2019) for Medication Management.        Recent Visits No visits were found meeting these conditions.  Showing recent visits within past 90 days and meeting all other requirements   Today's Visits Date Type Provider Dept  12/04/18 Office Visit [name] Santa, MD [facility]  Showing today's visits and meeting all other requirements   Future Appointments No visits were found meeting these conditions.  Showing future appointments within next 90 days and meeting all other requirements   I discussed the assessment and treatment plan with the patient. The patient was provided an opportunity to ask questions and all were answered. The patient agreed with the plan and demonstrated an understanding of the instructions.  Patient advised to call back or seek an in-person evaluation if the symptoms or condition worsens.  Total duration of non-face-to-face encounter: 42minutes.  Note by: [name] Santa, MD Date: 12/04/2018; Time: 1:06 PM  Note: This dictation was prepared with Dragon dictation. Any transcriptional errors that may result from this process are unintentional.  Disclaimer:  * Given the special circumstances of the COVID-19 pandemic, the federal government has announced that the Office for Civil Rights (OCR) will exercise its enforcement discretion and will not impose penalties on physicians using telehealth in the event of noncompliance with regulatory requirements under the [place] (HIPAA) in connection with the good faith provision of telehealth during the XX123456 national public health emergency. ([place])

## 2018-12-30 ENCOUNTER — Other Ambulatory Visit: Payer: Self-pay | Admitting: Family Medicine

## 2018-12-31 NOTE — Telephone Encounter (Signed)
Requested medication (s) are due for refill today: no  Requested medication (s) are on the active medication list: no  Last refill: 11/03/2018  Future visit scheduled: yes  Notes to clinic:  Medication was discontinue    Requested Prescriptions  Pending Prescriptions Disp Refills   losartan (COZAAR) 25 MG tablet [Pharmacy Med Name: LOSARTAN POTASSIUM 25 MG TAB] 60 tablet 0    Sig: TAKE 2 TABLETS BY MOUTH EVERY DAY     Cardiovascular:  Angiotensin Receptor Blockers Failed - 12/30/2018  5:51 PM      Failed - Cr in normal range and within 180 days    Creatinine  Date Value Ref Range Status  04/07/2012 0.74 0.60 - 1.30 mg/dL Final   Creatinine, Ser  Date Value Ref Range Status  08/03/2018 0.55 (L) 0.76 - 1.27 mg/dL Final         Passed - K in normal range and within 180 days    Potassium  Date Value Ref Range Status  08/03/2018 4.3 3.5 - 5.2 mmol/L Final  04/07/2012 4.2 3.5 - 5.1 mmol/L Final         Passed - Patient is not pregnant      Passed - Last BP in normal range    BP Readings from Last 1 Encounters:  09/27/18 108/79         Passed - Valid encounter within last 6 months    Recent Outpatient Visits          4 months ago Type 2 diabetes mellitus with hyperglycemia, without long-term current use of insulin [facility])   [place], [place], PA-C   5 months ago S/P BKA (below knee amputation) unilateral, left [facility])   [facility] [place], [place]   1 year ago Annual physical exam   [facility] [place], [place]   1 year ago Chronic left hip pain   [facility] [place] Roof [place], [place]

## 2018-12-31 NOTE — Telephone Encounter (Signed)
Routing to provider  

## 2019-02-25 ENCOUNTER — Encounter: Payer: Self-pay | Admitting: Family Medicine

## 2019-02-25 ENCOUNTER — Ambulatory Visit (INDEPENDENT_AMBULATORY_CARE_PROVIDER_SITE_OTHER): Payer: Medicaid Other | Admitting: Family Medicine

## 2019-02-25 ENCOUNTER — Other Ambulatory Visit: Payer: Self-pay

## 2019-02-25 VITALS — BP 156/100 | HR 105 | Temp 97.9°F

## 2019-02-25 DIAGNOSIS — Z89512 Acquired absence of left leg below knee: Secondary | ICD-10-CM

## 2019-02-25 DIAGNOSIS — M79671 Pain in right foot: Secondary | ICD-10-CM

## 2019-02-25 DIAGNOSIS — M79604 Pain in right leg: Secondary | ICD-10-CM

## 2019-02-25 DIAGNOSIS — Z720 Tobacco use: Secondary | ICD-10-CM | POA: Diagnosis not present

## 2019-02-25 DIAGNOSIS — I10 Essential (primary) hypertension: Secondary | ICD-10-CM

## 2019-02-25 DIAGNOSIS — E1165 Type 2 diabetes mellitus with hyperglycemia: Secondary | ICD-10-CM | POA: Diagnosis not present

## 2019-02-25 MED ORDER — TRULICITY 0.75 MG/0.5ML ~~LOC~~ SOAJ: 0.7500 mg | SUBCUTANEOUS | 2 refills | Status: DC
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
## 2019-02-26 LAB — BASIC METABOLIC PANEL
BUN/Creatinine Ratio: 14 (ref 9–20)
BUN: 10 mg/dL (ref 6–24)
CO2: 24 mmol/L (ref 20–29)
Calcium: 9.2 mg/dL (ref 8.7–10.2)
Chloride: 101 mmol/L (ref 96–106)
Creatinine, Ser: 0.74 mg/dL — ABNORMAL LOW (ref 0.76–1.27)
GFR calc Af Amer: 119 mL/min/{1.73_m2} (ref >59)
GFR calc non Af Amer: 103 mL/min/{1.73_m2} (ref >59)
Glucose: 175 mg/dL — ABNORMAL HIGH (ref 65–99)
Potassium: 4.2 mmol/L (ref 3.5–5.2)
Sodium: 140 mmol/L (ref 134–144)

## 2019-02-26 LAB — HEMOGLOBIN A1C
Est. average glucose Bld gHb Est-mCnc: 157 mg/dL
Hgb A1c MFr Bld: 7.1 % — ABNORMAL HIGH (ref 4.8–5.6)

## 2019-02-28 ENCOUNTER — Telehealth: Payer: Self-pay

## 2019-02-28 NOTE — Assessment & Plan Note (Signed)
Pt interested in chantix, will hold off while his pain is under such poor control. Revisit at 3 month f/u

## 2019-02-28 NOTE — Assessment & Plan Note (Signed)
Significantly elevated, suspect due to pain being under poor control currently. Will continue to monitor closely

## 2019-02-28 NOTE — Assessment & Plan Note (Signed)
Recheck A1C, continue working on lifestyle modifications, adjust medications as needed based on A1C.

## 2019-02-28 NOTE — Assessment & Plan Note (Signed)
With persistent pain at amputation site where his leg meets the prosthetic. No current abrasions or infection at this site. Suspect due to being out of his medications

## 2019-02-28 NOTE — Telephone Encounter (Signed)
PA for Trulicity submitted via [place] [employer].  Confirmation number: [id number]

## 2019-03-04 ENCOUNTER — Encounter: Payer: Self-pay | Admitting: Student in an Organized Health Care Education/Training Program

## 2019-03-04 NOTE — Telephone Encounter (Signed)
PA denied.

## 2019-03-05 ENCOUNTER — Other Ambulatory Visit: Payer: Self-pay

## 2019-03-05 ENCOUNTER — Encounter: Payer: Self-pay | Admitting: Student in an Organized Health Care Education/Training Program

## 2019-03-05 ENCOUNTER — Ambulatory Visit
Payer: Medicaid Other | Attending: Student in an Organized Health Care Education/Training Program | Admitting: Student in an Organized Health Care Education/Training Program

## 2019-03-05 DIAGNOSIS — G546 Phantom limb syndrome with pain: Secondary | ICD-10-CM

## 2019-03-05 DIAGNOSIS — G894 Chronic pain syndrome: Secondary | ICD-10-CM | POA: Diagnosis not present

## 2019-03-05 DIAGNOSIS — Z89512 Acquired absence of left leg below knee: Secondary | ICD-10-CM | POA: Diagnosis not present

## 2019-03-05 DIAGNOSIS — M533 Sacrococcygeal disorders, not elsewhere classified: Secondary | ICD-10-CM

## 2019-03-05 DIAGNOSIS — M47816 Spondylosis without myelopathy or radiculopathy, lumbar region: Secondary | ICD-10-CM

## 2019-03-05 DIAGNOSIS — G8929 Other chronic pain: Secondary | ICD-10-CM | POA: Diagnosis not present

## 2019-03-05 DIAGNOSIS — M47818 Spondylosis without myelopathy or radiculopathy, sacral and sacrococcygeal region: Secondary | ICD-10-CM | POA: Diagnosis not present

## 2019-03-05 MED ORDER — OXYCODONE HCL 10 MG PO TABS: 10.0000 mg | ORAL_TABLET | Freq: Four times a day (QID) | ORAL | 0 refills | Status: AC | PRN

## 2019-03-05 MED ORDER — OXYCODONE HCL 10 MG PO TABS: 10.0000 mg | ORAL_TABLET | Freq: Four times a day (QID) | ORAL | 0 refills | Status: DC | PRN
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
## 2019-03-12 ENCOUNTER — Other Ambulatory Visit: Payer: Self-pay

## 2019-03-12 ENCOUNTER — Ambulatory Visit (INDEPENDENT_AMBULATORY_CARE_PROVIDER_SITE_OTHER): Payer: Medicaid Other

## 2019-03-12 ENCOUNTER — Ambulatory Visit (INDEPENDENT_AMBULATORY_CARE_PROVIDER_SITE_OTHER): Payer: Medicaid Other | Admitting: Podiatry

## 2019-03-12 ENCOUNTER — Other Ambulatory Visit: Payer: Self-pay | Admitting: Podiatry

## 2019-03-12 DIAGNOSIS — S92424B Nondisplaced fracture of distal phalanx of right great toe, initial encounter for open fracture: Secondary | ICD-10-CM

## 2019-03-12 DIAGNOSIS — L600 Ingrowing nail: Secondary | ICD-10-CM

## 2019-03-12 DIAGNOSIS — M778 Other enthesopathies, not elsewhere classified: Secondary | ICD-10-CM

## 2019-03-12 DIAGNOSIS — S92911A Unspecified fracture of right toe(s), initial encounter for closed fracture: Secondary | ICD-10-CM

## 2019-03-12 MED ORDER — GENTAMICIN SULFATE 0.1 % EX CREA: 1.0000 "application " | TOPICAL_CREAM | Freq: Three times a day (TID) | CUTANEOUS | 1 refills | Status: DC

## 2019-03-12 NOTE — Patient Instructions (Signed)

## 2019-03-14 MED ORDER — JARDIANCE 10 MG PO TABS: 10.0000 mg | ORAL_TABLET | Freq: Every day | ORAL | 2 refills | Status: DC

## 2019-03-14 NOTE — Addendum Note (Signed)
Addended by: Merrie Roof E on: 03/14/2019 04:29 PM   Modules accepted: Orders

## 2019-03-14 NOTE — Telephone Encounter (Signed)
Patient notified

## 2019-03-14 NOTE — Telephone Encounter (Signed)
I just sent over jardiance, let's see if that will be covered for him instead

## 2019-03-15 NOTE — Progress Notes (Signed)
   Subjective: Patient presents today for evaluation of pain to the medial and lateral borders of the right great toe that began a few weeks ago. Patient is concerned for possible ingrown nail. Walking in shoes increases the pain.  He also reports constant dull aching pain of the right great toe that began about two months ago after dropping a hammer or a drill on it. He has been soaking the foot in Epsom salt for treatment. Walking increases the pain. Patient presents today for further treatment and evaluation.  Past Medical History:  Diagnosis Date  . Arthritis    hands  . Depression   . Diabetes mellitus without complication ([place])   . Dyspnea   . Hx of BKA ([place])    due to complicated fracture wears prosthesis  . Panic attack   . Wears dentures    upper and lower full plate    Objective:  General: Well developed, nourished, in no acute distress, alert and oriented x3   Dermatology: Skin is warm, dry and supple bilateral. Medial and lateral borders of the right great toe appears to be erythematous with evidence of an ingrowing nail. Pain on palpation noted to the border of the nail fold. The remaining nails appear unremarkable at this time. There are no open sores, lesions.  Vascular: Dorsalis Pedis artery and Posterior Tibial artery pedal pulses palpable. No lower extremity edema noted.   Neruologic: Grossly intact via light touch RLE.  Musculoskeletal: Pain with palpation noted to the distal right great toe. Muscular strength within normal limits in all groups RLE. Normal range of motion noted to all pedal and ankle joints.   Radiographic Exam: Closed, nondisplaced hairline fracture noted to the distal phalanx of the right great toe.   Assesement: #1 Paronychia with ingrowing nail medial and lateral borders right hallux #2 Pain in toe #3 Incurvated nail #4 h/o BKA LLE #5 Right great toe fracture   Plan of Care:  1. Patient evaluated.  2. Discussed treatment alternatives  and plan of care. Explained nail avulsion procedure and post procedure course to patient. 3. Patient opted for permanent partial nail avulsion of the medial and lateral borders of the right great toe.  4. Prior to procedure, local anesthesia infiltration utilized using 3 ml of a 50:50 mixture of 2% plain lidocaine and 0.5% plain marcaine in a normal hallux block fashion and a betadine prep performed.  5. Partial permanent nail avulsion with chemical matrixectomy performed using [id number] applications of phenol followed by alcohol flush.  6. Light dressing applied. 7. Recommended good shoe gear.  8. Prescription for Gentamicin cream provided to patient to use daily with a bandage.  9. Return to clinic in 3 weeks.  Edrick Kins, DPM [facility]  Dr. Edrick Kins, [place]                                        [place], [place] [address]                Office [phone]  Fax [phone]

## 2019-04-02 ENCOUNTER — Ambulatory Visit: Payer: Medicaid Other | Admitting: Podiatry

## 2019-04-24 ENCOUNTER — Ambulatory Visit: Payer: Medicaid Other | Admitting: Student in an Organized Health Care Education/Training Program

## 2019-05-07 ENCOUNTER — Other Ambulatory Visit: Payer: Self-pay | Admitting: Student in an Organized Health Care Education/Training Program

## 2019-05-07 DIAGNOSIS — G546 Phantom limb syndrome with pain: Secondary | ICD-10-CM

## 2019-05-30 ENCOUNTER — Ambulatory Visit: Payer: Medicaid Other | Admitting: Family Medicine

## 2019-05-30 ENCOUNTER — Telehealth: Payer: Medicaid Other | Admitting: Family Medicine

## 2019-06-03 ENCOUNTER — Telehealth: Payer: Self-pay | Admitting: *Deleted

## 2019-06-03 NOTE — Telephone Encounter (Signed)
Attempted to call for pre appointment review of allergies/meds. No voicemail set up.

## 2019-06-03 NOTE — Telephone Encounter (Signed)
Attempted to call for pre appointment review of allergies/meds. Voicemail not set up.

## 2019-06-04 ENCOUNTER — Telehealth: Payer: Self-pay

## 2019-06-04 ENCOUNTER — Other Ambulatory Visit: Payer: Self-pay

## 2019-06-04 ENCOUNTER — Ambulatory Visit
Payer: Medicaid Other | Attending: Student in an Organized Health Care Education/Training Program | Admitting: Student in an Organized Health Care Education/Training Program

## 2019-06-04 ENCOUNTER — Encounter: Payer: Self-pay | Admitting: Student in an Organized Health Care Education/Training Program

## 2019-06-04 DIAGNOSIS — M47818 Spondylosis without myelopathy or radiculopathy, sacral and sacrococcygeal region: Secondary | ICD-10-CM

## 2019-06-04 DIAGNOSIS — G546 Phantom limb syndrome with pain: Secondary | ICD-10-CM

## 2019-06-04 DIAGNOSIS — M7918 Myalgia, other site: Secondary | ICD-10-CM | POA: Diagnosis not present

## 2019-06-04 DIAGNOSIS — M47816 Spondylosis without myelopathy or radiculopathy, lumbar region: Secondary | ICD-10-CM | POA: Diagnosis not present

## 2019-06-04 DIAGNOSIS — Z89512 Acquired absence of left leg below knee: Secondary | ICD-10-CM

## 2019-06-04 DIAGNOSIS — G894 Chronic pain syndrome: Secondary | ICD-10-CM | POA: Diagnosis not present

## 2019-06-04 DIAGNOSIS — M533 Sacrococcygeal disorders, not elsewhere classified: Secondary | ICD-10-CM | POA: Diagnosis not present

## 2019-06-04 DIAGNOSIS — G8929 Other chronic pain: Secondary | ICD-10-CM

## 2019-06-04 MED ORDER — TIZANIDINE HCL 4 MG PO TABS: 4.0000 mg | ORAL_TABLET | Freq: Three times a day (TID) | ORAL | 2 refills | Status: AC | PRN

## 2019-06-04 MED ORDER — OXYCODONE HCL 10 MG PO TABS: 10.0000 mg | ORAL_TABLET | Freq: Four times a day (QID) | ORAL | 0 refills | Status: AC | PRN

## 2019-06-04 MED ORDER — GABAPENTIN 600 MG PO TABS: ORAL_TABLET | ORAL | 2 refills | Status: DC

## 2019-06-04 NOTE — Telephone Encounter (Signed)
Pt was called and no answering service was set up.

## 2019-06-04 NOTE — Progress Notes (Signed)
Patient: [name] Day.  Service Category: E/M  Provider: Gillis Santa, MD  DOB: 1961-01-03  DOS: 06/04/2019  Location: Office  MRN: [id number]  Setting: Ambulatory outpatient  Referring Provider: Volney American,*  Type: Established Patient  Specialty: Interventional Pain Management  PCP: Volney American, PA-C  Location: Home  Delivery: TeleHealth     Virtual Encounter - Pain Management PROVIDER NOTE: Information contained herein reflects review and annotations entered in association with encounter. Interpretation of such information and data should be left to medically-trained personnel. Information provided to patient can be located elsewhere in the medical record under "Patient Instructions". Document created using STT-dictation technology, any transcriptional errors that may result from process are unintentional.    Contact & Pharmacy Preferred: [place]: [phone] (home) Mobile: [phone] (mobile) E-mail: No e-mail address on record  [id number] [id number]Phone: [phone]: [phone]  Pre-screening  Mr. SBerkeloffered "in-person" vs "virtual" encounter. He indicated preferring virtual for this encounter.   Reason COVID-19*  Social distancing based on CDC and AMA recommendations.   I contacted [name] Day on 06/04/2019 via telephone.      I clearly identified myself as BGillis Santa MD. I verified that I was speaking with the correct person using two identifiers (Name: [name] Day, and date of birth: 123-Aug-1962.  This visit was completed via telephone due to the restrictions of the COVID-19 pandemic. All issues as above were discussed and addressed but no physical exam was performed. If it was felt that the patient should be evaluated in the office, they were directed there. The patient verbally consented to this visit. Patient was unable  to complete an audio/visual visit due to Technical difficulties and/or Lack of internet. Due to the catastrophic nature of the COVID-19 pandemic, this visit was done through audio contact only.  Location of the patient: home address (see Epic for details)  Location of the provider: office  Consent I sought verbal advanced consent from [name] Day for virtual visit interactions. I informed [name] Day possible security and privacy concerns, risks, and limitations associated with providing "not-in-person" medical evaluation and management services. I also informed [name] the availability of "in-person" appointments. Finally, I informed him that there would be a charge for the virtual visit and that he could be  personally, fully or partially, financially responsible for it. Mr. SAlcocerexpressed understanding and agreed to proceed.   Historic Elements   [name] is a 59y.o. year old, male patient evaluated today after his last contact with our practice on 06/04/2019. [name] Day has a past medical history of Arthritis, Depression, Diabetes mellitus without complication ([place], Dyspnea, [place] ([place], Panic attack, and Wears dentures. He also  has a past surgical history that includes Leg amputation; Hand surgery; Leg Surgery; Colonoscopy with propofol (N/A, 09/27/2018); and polypectomy (N/A, 09/27/2018). Mr. SEllershas a current medication list which includes the following prescription(s): acetaminophen, trulicity, jardiance, gabapentin, gentamicin cream, metformin, [START ON 06/06/2019] oxycodone hcl, [START ON 07/06/2019] oxycodone hcl, [START ON 08/05/2019] oxycodone hcl, and tizanidine. He  reports that he has been smoking cigarettes. He has a 45.00 pack-year smoking history. He has never used smokeless tobacco. He reports that he does not drink alcohol or use drugs. Mr. STrupianois allergic to tramadol.   HPI  Today, he is being contacted for medication  management.   No  change in medical history since last visit.  Patient's pain is at baseline.  Patient continues multimodal pain regimen as prescribed.  States that it provides pain relief and improvement in functional status.  Pharmacotherapy Assessment  Analgesic: 05/07/2019  1   03/05/2019  Oxycodone Hcl 10 MG Tablet  120.00  30 Bi Lat   [id number]   Nor (2541)   0  60.00 MME  Private Pay   [place]   Monitoring: [place] PMP: PDMP reviewed during this encounter.       Pharmacotherapy: No side-effects or adverse reactions reported. Compliance: No problems identified. Effectiveness: Clinically acceptable. Plan: Refer to "POC".  UDS:  Summary  Date Value Ref Range Status  06/04/2018 FINAL  Final    Comment:    ==================================================================== TOXASSURE SELECT 13 (MW) ==================================================================== Test                             Result       Flag       Units Drug Present and Declared for Prescription Verification   Oxycodone                      2489         EXPECTED   ng/mg creat   Oxymorphone                    4404         EXPECTED   ng/mg creat   Noroxycodone                   2340         EXPECTED   ng/mg creat   Noroxymorphone                 551          EXPECTED   ng/mg creat    Sources of oxycodone are scheduled prescription medications.    Oxymorphone, noroxycodone, and noroxymorphone are expected    metabolites of oxycodone. Oxymorphone is also available as a    scheduled prescription medication. ==================================================================== Test                      Result    Flag   Units      Ref Range   Creatinine              82               mg/dL      >=20 ==================================================================== Declared Medications:  The flagging and interpretation on this report are based on the  following declared medications.  Unexpected results may arise from   inaccuracies in the declared medications.  **Note: The testing scope of this panel includes these medications:  Oxycodone  **Note: The testing scope of this panel does not include following  reported medications:  Acetaminophen  Gabapentin  Naproxen (Aleve)  Tizanidine ==================================================================== For clinical consultation, please call [phone]. ====================================================================    Laboratory Chemistry Profile   Renal Lab Results  Component Value Date   BUN 10 02/25/2019   CREATININE 0.74 (L) 02/25/2019   BCR 14 02/25/2019   GFRAA 119 02/25/2019   GFRNONAA 103 02/25/2019    Hepatic Lab Results  Component Value Date   AST 27 08/03/2018   ALT 33 08/03/2018   ALBUMIN 4.5 08/03/2018   ALKPHOS 82 08/03/2018   LIPASE 27 08/07/2017  Electrolytes Lab Results  Component Value Date   NA 140 02/25/2019   K 4.2 02/25/2019   CL 101 02/25/2019   CALCIUM 9.2 02/25/2019    Bone No results found for: VD25OH, VD125OH2TOT, UP1031RX4, VO5929WK4, 25OHVITD1, 25OHVITD2, 25OHVITD3, TESTOFREE, TESTOSTERONE  Inflammation (CRP: Acute Phase) (ESR: Chronic Phase) No results found for: CRP, ESRSEDRATE, LATICACIDVEN    Note: Above Lab results reviewed.  Assessment  The primary encounter diagnosis was Chronic pain syndrome. Diagnoses of Lumbar spondylosis, Chronic sacroiliac joint pain, 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HCC), Phantom limb pain (HCC), S/P BKA (below knee amputation) unilateral, left (HCC), SI joint arthritis, and Chronic musculoskeletal pain were also pertinent to this visit.  Plan of Care   Mr. Kaydin Karbowski. has a current medication list which includes the following long-term medication(s): gabapentin and metformin.  Pharmacotherapy (Medications Ordered): Meds ordered this encounter  Medications  . gabapentin (NEURONTIN) 600 MG tablet    Sig: 1200 mg  BID     Dispense:  120 tablet    Refill:  2    Do not place this medication, or any other prescription from our practice, on "Automatic Refill". Patient may have prescription filled one day early if pharmacy is closed on scheduled refill date.  . Oxycodone HCl 10 MG TABS    Sig: Take 1 tablet (10 mg total) by mouth every 6 (six) hours as needed.    Dispense:  120 tablet    Refill:  0    Do not place this medication, or any other prescription from our practice, on "Automatic Refill". Patient may have prescription filled one day early if pharmacy is closed on scheduled refill date.  . Oxycodone HCl 10 MG TABS    Sig: Take 1 tablet (10 mg total) by mouth every 6 (six) hours as needed.    Dispense:  120 tablet    Refill:  0    Do not place this medication, or any other prescription from our practice, on "Automatic Refill". Patient may have prescription filled one day early if pharmacy is closed on scheduled refill date.  . Oxycodone HCl 10 MG TABS    Sig: Take 1 tablet (10 mg total) by mouth every 6 (six) hours as needed.    Dispense:  120 tablet    Refill:  0    Do not place this medication, or any other prescription from our practice, on "Automatic Refill". Patient may have prescription filled one day early if pharmacy is closed on scheduled refill date.  Marland Kitchen tiZANidine (ZANAFLEX) 4 MG tablet    Sig: Take 1 tablet (4 mg total) by mouth every 8 (eight) hours as needed for muscle spasms.    Dispense:  90 tablet    Refill:  2    Do not place this medication, or any other prescription from our practice, on "Automatic Refill". Patient may have prescription filled one day early if pharmacy is closed on scheduled refill date.   Orders:  Orders Placed This Encounter  Procedures  . ToxASSURE Select 13 (MW), Urine    Volume: 30 ml(s). Minimum 3 ml of urine is needed. Document temperature of fresh sample. Indications: Long term (current) use of opiate analgesic (Q28.638)   Follow-up plan:   Return in about  3 months (around 09/01/2019) for Medication Management, in person.    Recent Visits No visits were found meeting these conditions.  Showing recent visits within past 90 days and meeting all other requirements   Today's Visits  Date Type Provider Dept  06/04/19 Office Visit Gillis Santa, MD [facility]  Showing today's visits and meeting all other requirements   Future Appointments No visits were found meeting these conditions.  Showing future appointments within next 90 days and meeting all other requirements   I discussed the assessment and treatment plan with the patient. The patient was provided an opportunity to ask questions and all were answered. The patient agreed with the plan and demonstrated an understanding of the instructions.  Patient advised to call back or seek an in-person evaluation if the symptoms or condition worsens.  Duration of encounter: 25 minutes.  Note by: Gillis Santa, MD Date: 06/04/2019; Time: 12:06 PM

## 2019-06-11 ENCOUNTER — Telehealth: Payer: Self-pay | Admitting: Family Medicine

## 2019-06-11 NOTE — Telephone Encounter (Signed)
Pt called and stated that he needs an RX for outer liner and inner for his prosthetic. Pt states this needs to be sent to [facility] orthopedic and prosthetic.   Fax#  [phone]

## 2019-06-12 NOTE — Telephone Encounter (Signed)
Order written and placed in provider folder for signature.

## 2019-06-20 NOTE — Telephone Encounter (Signed)
Pt called and is requesting a call back with an update on this Rx, he cant wear his leg without this. Please advise  Best contact [phone]

## 2019-06-20 NOTE — Telephone Encounter (Signed)
Called patient to let him know that the prescription has been faxed, no answer, unable to leave a message, will try again.

## 2019-06-20 NOTE — Telephone Encounter (Signed)
Patient called back and was informed.

## 2019-07-04 DIAGNOSIS — Z89512 Acquired absence of left leg below knee: Secondary | ICD-10-CM | POA: Diagnosis not present

## 2019-08-04 ENCOUNTER — Other Ambulatory Visit: Payer: Self-pay | Admitting: Student in an Organized Health Care Education/Training Program

## 2019-08-04 DIAGNOSIS — G8929 Other chronic pain: Secondary | ICD-10-CM

## 2019-08-04 DIAGNOSIS — M7918 Myalgia, other site: Secondary | ICD-10-CM

## 2019-08-20 ENCOUNTER — Ambulatory Visit: Payer: Self-pay | Admitting: Family Medicine

## 2019-08-20 NOTE — Telephone Encounter (Signed)
Pt reports wound to right forearm. 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 States he thought it was a splinter "And began cutting on it but no splinter." States "Blood and pus came out when I cut into it." No bleeding or drainage presently, "But I have a lot of antibiotic cream on it."  States area he "Cut out" is now size of a dime, redness and warmth "Hot" 4 inch circumference around wound. Also reports 1 in hardened area at center. Reports headache "Which I never get" and "Feel warm" unsure if febrile. Practice about to close, directed to UC. Pt states will follow disposition.   Reason for Disposition . [1] Looks infected AND [2] large red area or streak (>2 inches or 5 cm)  Answer Assessment - Initial Assessment Questions 1. APPEARANCE of INJURY: "What does the injury look like?"      Red, warm 2. SIZE: "How large is the cut?"      "Hole size of dime" 3. BLEEDING: "Is it bleeding now?" If so, ask: "Is it difficult to stop?"      no 4. LOCATION: "Where is the injury located?"      Right forearm 5. ONSET: "How long ago did the injury occur?"      Unsure, at work 6. 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 TETANUS: "When was the last tetanus booster?"     3-4 years ago  Protocols used: SKIN INJURY-A-AH

## 2019-08-28 ENCOUNTER — Telehealth: Payer: Self-pay

## 2019-08-28 ENCOUNTER — Encounter: Payer: Self-pay | Admitting: Emergency Medicine

## 2019-08-28 ENCOUNTER — Emergency Department
Admission: EM | Admit: 2019-08-28 | Discharge: 2019-08-29 | Disposition: A | Discharge status: Home / Self Care | Payer: Medicaid Other | Attending: Emergency Medicine | Admitting: Emergency Medicine

## 2019-08-28 ENCOUNTER — Encounter: Payer: Self-pay | Admitting: Student in an Organized Health Care Education/Training Program

## 2019-08-28 ENCOUNTER — Other Ambulatory Visit: Payer: Self-pay

## 2019-08-28 DIAGNOSIS — Z5321 Procedure and treatment not carried out due to patient leaving prior to being seen by health care provider: Secondary | ICD-10-CM | POA: Insufficient documentation

## 2019-08-28 DIAGNOSIS — I959 Hypotension, unspecified: Secondary | ICD-10-CM | POA: Diagnosis not present

## 2019-08-28 DIAGNOSIS — R112 Nausea with vomiting, unspecified: Secondary | ICD-10-CM | POA: Diagnosis not present

## 2019-08-28 DIAGNOSIS — I4510 Unspecified right bundle-branch block: Secondary | ICD-10-CM | POA: Diagnosis not present

## 2019-08-28 DIAGNOSIS — R1030 Lower abdominal pain, unspecified: Secondary | ICD-10-CM | POA: Diagnosis not present

## 2019-08-28 DIAGNOSIS — Z209 Contact with and (suspected) exposure to unspecified communicable disease: Secondary | ICD-10-CM | POA: Diagnosis not present

## 2019-08-28 DIAGNOSIS — R000 Tachycardia, unspecified: Secondary | ICD-10-CM | POA: Diagnosis not present

## 2019-08-28 LAB — COMPREHENSIVE METABOLIC PANEL
ALT: 27 U/L (ref 0–44)
AST: 19 U/L (ref 15–41)
Albumin: 4 g/dL (ref 3.5–5.0)
Alkaline Phosphatase: 72 U/L (ref 38–126)
Anion gap: 12 (ref 5–15)
BUN: 15 mg/dL (ref 6–20)
CO2: 21 mmol/L — ABNORMAL LOW (ref 22–32)
Calcium: 8.7 mg/dL — ABNORMAL LOW (ref 8.9–10.3)
Chloride: 101 mmol/L (ref 98–111)
Creatinine, Ser: 0.57 mg/dL — ABNORMAL LOW (ref 0.61–1.24)
GFR calc Af Amer: >60 mL/min (ref >60)
GFR calc non Af Amer: >60 mL/min (ref >60)
Glucose, Bld: 200 mg/dL — ABNORMAL HIGH (ref 70–99)
Potassium: 4.3 mmol/L (ref 3.5–5.1)
Sodium: 134 mmol/L — ABNORMAL LOW (ref 135–145)
Total Bilirubin: 0.6 mg/dL (ref 0.3–1.2)
Total Protein: 8.1 g/dL (ref 6.5–8.1)

## 2019-08-28 LAB — CBC WITH DIFFERENTIAL/PLATELET
Abs Immature Granulocytes: 0.19 10*3/uL — ABNORMAL HIGH (ref 0.00–0.07)
Basophils Absolute: 0.1 10*3/uL (ref 0.0–0.1)
Basophils Relative: 1 %
Eosinophils Absolute: 0.1 10*3/uL (ref 0.0–0.5)
Eosinophils Relative: 1 %
HCT: 47.6 % (ref 39.0–52.0)
Hemoglobin: 16.5 g/dL (ref 13.0–17.0)
Immature Granulocytes: 1 %
Lymphocytes Relative: 8 %
Lymphs Abs: 1.3 10*3/uL (ref 0.7–4.0)
MCH: 29.8 pg (ref 26.0–34.0)
MCHC: 34.7 g/dL (ref 30.0–36.0)
MCV: 85.9 fL (ref 80.0–100.0)
Monocytes Absolute: 0.5 10*3/uL (ref 0.1–1.0)
Monocytes Relative: 3 %
Neutro Abs: 14.5 10*3/uL — ABNORMAL HIGH (ref 1.7–7.7)
Neutrophils Relative %: 86 %
Platelets: 236 10*3/uL (ref 150–400)
RBC: 5.54 MIL/uL (ref 4.22–5.81)
RDW: 13.1 % (ref 11.5–15.5)
WBC: 16.6 10*3/uL — ABNORMAL HIGH (ref 4.0–10.5)
nRBC: 0 % (ref 0.0–0.2)

## 2019-08-28 LAB — LIPASE, BLOOD: Lipase: 26 U/L (ref 11–51)

## 2019-08-28 MED ORDER — ONDANSETRON 4 MG PO TBDP
4.0000 mg | ORAL_TABLET | Freq: Once | ORAL | Status: AC
  Administered 2019-08-28: 4 mg via ORAL
  Filled 2019-08-28: qty 1

## 2019-08-28 NOTE — ED Triage Notes (Signed)
Pt to triage via w/c with no distress noted, mask in place; Pt st since last Thursday having HA accomp by N/V/D and lower abd & back pain

## 2019-08-28 NOTE — Telephone Encounter (Signed)
Attempted to call for pre virtual appointment questions.  Call could not be completed at this time.

## 2019-08-28 NOTE — ED Notes (Signed)
Pt given ice chips to tolerate.

## 2019-08-28 NOTE — ED Notes (Signed)
Pt to the er via ems for nausea vomiting, body aches since Thursday with a positive covid exposure. Pt was tested a month ago and was negative. Pt is febrile at 102, tahcy at 140, RBBB on the monitor. BG 190. Unable to hold down his regular meds

## 2019-08-29 ENCOUNTER — Telehealth: Payer: Self-pay

## 2019-08-29 ENCOUNTER — Ambulatory Visit (HOSPITAL_BASED_OUTPATIENT_CLINIC_OR_DEPARTMENT_OTHER): Payer: Medicaid Other | Admitting: Student in an Organized Health Care Education/Training Program

## 2019-08-29 DIAGNOSIS — G894 Chronic pain syndrome: Secondary | ICD-10-CM

## 2019-08-29 NOTE — Telephone Encounter (Signed)
Pt was called and unable to leave message on answering service.

## 2019-08-29 NOTE — ED Notes (Signed)
Pt to the desk stating he wants to go home. Advised pt to follow up with his doctor in the morning.

## 2019-08-29 NOTE — Progress Notes (Signed)
I attempted to call the patient however no response. No VM option. Pt needs to complete urine screen prior to next refill. -Dr Holley Raring

## 2020-03-07 DIAGNOSIS — E1165 Type 2 diabetes mellitus with hyperglycemia: Secondary | ICD-10-CM | POA: Diagnosis not present

## 2020-03-07 DIAGNOSIS — M542 Cervicalgia: Secondary | ICD-10-CM | POA: Diagnosis not present

## 2020-03-07 DIAGNOSIS — R52 Pain, unspecified: Secondary | ICD-10-CM | POA: Diagnosis not present

## 2020-03-11 ENCOUNTER — Ambulatory Visit: Payer: Medicaid Other | Admitting: Nurse Practitioner

## 2020-03-11 ENCOUNTER — Encounter: Payer: Self-pay | Admitting: Nurse Practitioner

## 2020-03-11 ENCOUNTER — Other Ambulatory Visit: Payer: Self-pay

## 2020-03-11 VITALS — BP 138/84 | HR 92 | Temp 97.5°F | Wt 297.2 lb

## 2020-03-11 DIAGNOSIS — E1129 Type 2 diabetes mellitus with other diabetic kidney complication: Secondary | ICD-10-CM

## 2020-03-11 DIAGNOSIS — G546 Phantom limb syndrome with pain: Secondary | ICD-10-CM

## 2020-03-11 DIAGNOSIS — Z89512 Acquired absence of left leg below knee: Secondary | ICD-10-CM | POA: Diagnosis not present

## 2020-03-11 DIAGNOSIS — R809 Proteinuria, unspecified: Secondary | ICD-10-CM

## 2020-03-11 DIAGNOSIS — E1159 Type 2 diabetes mellitus with other circulatory complications: Secondary | ICD-10-CM

## 2020-03-11 DIAGNOSIS — F17219 Nicotine dependence, cigarettes, with unspecified nicotine-induced disorders: Secondary | ICD-10-CM

## 2020-03-11 DIAGNOSIS — G894 Chronic pain syndrome: Secondary | ICD-10-CM

## 2020-03-11 DIAGNOSIS — I152 Hypertension secondary to endocrine disorders: Secondary | ICD-10-CM

## 2020-03-11 DIAGNOSIS — E1165 Type 2 diabetes mellitus with hyperglycemia: Secondary | ICD-10-CM | POA: Diagnosis not present

## 2020-03-11 LAB — MICROALBUMIN, URINE WAIVED
Creatinine, Urine Waived: 100 mg/dL (ref 10–300)
Microalb, Ur Waived: 150 mg/L — ABNORMAL HIGH (ref 0–19)
Microalb/Creat Ratio: >300 mg/g — ABNORMAL HIGH (ref <30)

## 2020-03-11 MED ORDER — GABAPENTIN 600 MG PO TABS: ORAL_TABLET | ORAL | 2 refills | Status: DC

## 2020-03-11 NOTE — Assessment & Plan Note (Signed)
Chronic, ongoing to multiple locations, including phantom pain syndrome.  He wishes new referral to pain management, but would like to see alternate provider.  Will place new referral at this time.  Refills on Gabapentin sent, renal dose as needed.  Recommend avoiding Ibuprofen products and use Tylenol as needed.

## 2020-03-11 NOTE — Assessment & Plan Note (Addendum)
I saw patient in the clinic. His current prosthesis is ill-fitting and causing a wound on residual limb. His limb shape has changed due to significant weight loss and limb maturation and because of this, does not fit correctly at this point. He wears over 15 ply socks but the prosthesis still doesn't fit correctly. It is medically necessary in my opinion for him to be fit with a new prosthesis that fits properly and is suitable to his new weight and body structure.

## 2020-03-11 NOTE — Assessment & Plan Note (Signed)
With diabetes and suspect some COPD.  I have recommended complete cessation of tobacco use. I have discussed various options available for assistance with tobacco cessation including over the counter methods (Nicotine gum, patch and lozenges). We also discussed prescription options (Chantix, Nicotine Inhaler / Nasal Spray). The patient is not interested in pursuing any prescription tobacco cessation options at this time.  Discussed lung screening and highly recommended this, he wishes to think about it.

## 2020-03-11 NOTE — Assessment & Plan Note (Signed)
Refer to chronic pain syndrome plan of care.

## 2020-03-11 NOTE — Progress Notes (Addendum)
BP 138/84 (BP Location: Left Arm) Comment: just had cigarette prior to coming in office  Pulse 92 Comment: apical  Temp (!) 97.5 F (36.4 C)   Wt 297 lb 3.2 oz (134.8 kg)   SpO2 98%   BMI 40.31 kg/m    Subjective:    Patient ID: [name] Amy., male    DOB: Nov 19, 1960, 59 y.o.   MRN: [id number]  HPI: [name] Snodgrass. is a 59 y.o. male  Chief Complaint  Patient presents with  . Pain    pt states for about 4 months he has constant pain in his shoulders and neck, feels some relief when he is laying down. Patient states he feels numbness and tingling in both his hands  . leg prosthetic    pt requesting new rx for leg prosthetic and sleeves, through [facility] in [place] Was followed by Dr. Holley Raring for chronic pain syndrome and last seen 06/04/19, had visit in May 2021 virtual, but appears to have missed this.  Was taking Oxycodone and Gabapentin at time.  States he has not taken pain medications since June -- he reports poor relationship with previous pain provider and would like to see new provider.  Reports Gabapentin worked well and would like refills.  Takes two to three Aleeve a day at this time.  Most of his pain is in prosthetic area, but having pain to shoulders and neck -- constant pain reported.  Reports he had pictures of hands long time ago and "bones are almost like they are shattered", gets severe arthritis pain to hands bilaterally.  Has no previous imaging cervical spine on Epic. Duration: chronic Severity: moderate  Quality:  dull and aching Frequency: constant Radiation: no Aggravating factors: movement  Alleviating factors: APAP and NSAIDs  Status: stable Treatments attempted: APAP, ibuprofen and aleve  Relief with NSAIDs?:  moderate Weakness: yes Numbness: yes Decreased grip strength: no Redness: no Swelling: no Bruising: no Fevers: no   DIABETES Previously taking Trulicity, Metformin, Jardiance and  Gabapentin -- not currently taking and last A1C November 2020 was 7.1%.  Has not been seen in office since 02/25/2019 by his previous PCP.  Reports he has not taken medication since the first of June, says he is not sure why he stopped taking.    He reports need for new leg prosthetic and sleeves through [facility] in [facility] -- history of above knee amputation to left leg in 2013.  Has been using prosthetics since 2017, which offer benefit to mobility.  Reports this "changed his whole world".    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  Smokes 3/4 PPD at this time -- prior to this smoked 1 1/2 PPD.  Does not drink anymore.   Hypoglycemic episodes:no Polydipsia/polyuria: no Visual disturbance: no Chest pain: no Paresthesias: no Glucose Monitoring: no  Accucheck frequency: Not Checking  Fasting glucose:  Post prandial:  Evening:  Before meals: Taking Insulin?: no  Long acting insulin:  Short acting insulin: Blood Pressure Monitoring: not checking Retinal Examination: Not up to Date Foot Exam: Not up to Date Pneumovax: Not up to Date Influenza: Not up to Date Aspirin: no  Relevant past medical, surgical, family and social history reviewed and updated as indicated. Interim medical history since our last visit reviewed. Allergies and medications reviewed and updated.  Review of Systems  Constitutional: Negative for activity change, diaphoresis, fatigue and fever.  Respiratory:  Negative for cough, chest tightness, shortness of breath and wheezing.   Cardiovascular: Negative for chest pain, palpitations and leg swelling.  Gastrointestinal: Negative.   Endocrine: Negative for cold intolerance, heat intolerance, polydipsia, polyphagia and polyuria.  Neurological: Negative.   Psychiatric/Behavioral: Negative.     Per HPI unless specifically indicated above     Objective:    BP 138/84 (BP Location: Left Arm) Comment: just had cigarette prior to  coming in office  Pulse 92 Comment: apical  Temp (!) 97.5 F (36.4 C)   Wt 297 lb 3.2 oz (134.8 kg)   SpO2 98%   BMI 40.31 kg/m   Wt Readings from Last 3 Encounters:  03/11/20 297 lb 3.2 oz (134.8 kg)  09/27/18 (!) 303 lb (137.4 kg)  08/31/18 (!) 309 lb (140.2 kg)    Physical Exam Vitals and nursing note reviewed.  Constitutional:      General: He is awake. He is not in acute distress.    Appearance: He is well-developed. He is morbidly obese. He is not ill-appearing or toxic-appearing.     Comments: Slightly disheveled appearance.  HENT:     Head: Normocephalic and atraumatic.     Right Ear: Hearing normal. No drainage.     Left Ear: Hearing normal. No drainage.  Eyes:     General: Lids are normal.        Right eye: No discharge.        Left eye: No discharge.     Conjunctiva/sclera: Conjunctivae normal.     Pupils: Pupils are equal, round, and reactive to light.  Neck:     Thyroid: No thyromegaly.     Vascular: No carotid bruit.     Trachea: Trachea normal.  Cardiovascular:     Rate and Rhythm: Normal rate and regular rhythm.     Heart sounds: Normal heart sounds, S1 normal and S2 normal. No murmur heard.  No gallop.   Pulmonary:     Effort: Pulmonary effort is normal. No accessory muscle usage or respiratory distress.     Breath sounds: Normal breath sounds.  Abdominal:     General: Bowel sounds are normal.     Palpations: Abdomen is soft. There is no hepatomegaly or splenomegaly.  Musculoskeletal:        General: Normal range of motion.     Cervical back: Normal range of motion and neck supple.     Right lower leg: No edema.     Left lower leg: No edema.     Comments: Left above knee amputation with stump intact, prosthetic intact, sleeve with multiple holes.  Skin:    General: Skin is warm and dry.     Capillary Refill: Capillary refill takes less than 2 seconds.     Findings: No rash.  Neurological:     Mental Status: He is alert and oriented to person,  place, and time.     Deep Tendon Reflexes: Reflexes are normal and symmetric.  Psychiatric:        Attention and Perception: Attention normal.        Mood and Affect: Mood normal.        Speech: Speech normal.        Behavior: Behavior normal. Behavior is cooperative.        Thought Content: Thought content normal.     Diabetic Foot Exam - Simple   Simple Foot Form Visual Inspection See comments: Yes Sensation Testing See comments: Yes Pulse Check See comments: Yes Comments Left  above knee amputation, stump intact.  Right foot 1+ PT and DP pulses. Sensation 7/10.  Thick toenails and dry skin.    Results for orders placed or performed in visit on 44/03/47  Basic metabolic panel  Result Value Ref Range   Glucose 298 (H) 65 - 99 mg/dL   BUN 8 6 - 24 mg/dL   Creatinine, Ser 0.74 (L) 0.76 - 1.27 mg/dL   GFR calc non Af Amer 102 >59 mL/min/1.73   GFR calc Af Amer 118 >59 mL/min/1.73   BUN/Creatinine Ratio 11 9 - 20   Sodium 131 (L) 134 - 144 mmol/L   Potassium 4.6 3.5 - 5.2 mmol/L   Chloride 91 (L) 96 - 106 mmol/L   CO2 24 20 - 29 mmol/L   Calcium 9.9 8.7 - 10.2 mg/dL  Lipid Panel w/o Chol/HDL Ratio  Result Value Ref Range   Cholesterol, Total 192 100 - 199 mg/dL   Triglycerides 142 0 - 149 mg/dL   HDL 41 >39 mg/dL   VLDL Cholesterol Cal 25 5 - 40 mg/dL   LDL Chol Calc (NIH) 126 (H) 0 - 99 mg/dL  TSH  Result Value Ref Range   TSH 2.480 0.450 - 4.500 uIU/mL  HgB A1c  Result Value Ref Range   Hgb A1c MFr Bld 8.5 (H) 4.8 - 5.6 %   Est. average glucose Bld gHb Est-mCnc 197 mg/dL  Microalbumin, Urine Waived  Result Value Ref Range   Microalb, Ur Waived 150 (H) 0 - 19 mg/L   Creatinine, Urine Waived 100 10 - 300 mg/dL   Microalb/Creat Ratio >300 (H) <30 mg/g      Assessment & Plan:   Problem List Items Addressed This Visit      Cardiovascular and Mediastinum   Hypertension associated with diabetes ([place])    Chronic, initial BPs elevated, but recheck closer to goal.   Would benefit from addition of ARB, such as Losartan, for kidney protection in diabetes, but patient wishes to hold off on this. Would avoid ACE as suspect some underlying COPD present.  Urine ALB today 150.  Will continue to discuss addition of ARB at next visit and start if patient agrees.  Recommend he monitor BP at home a few days a week and document for provider + focus on DASH diet.  BMP and TSH today.  Recommend complete cessation of smoking.  Return in 3 months to meet new PCP.        Endocrine   Type 2 diabetes mellitus with proteinuria ([facility]) - Primary    Chronic, ongoing.  Will obtain A1C today, suspect this will be elevated -- have discussed with patient will contact him with results and most likely recommend he restart medications.  He is agreeable to this plan.  Urine ALB today 150, would benefit from addition of ARB for kidney protection, but refuses to start at this time.  Would avoid ACE as highly suspect some underlying COPD present.  Recommend complete cessation of smoking.  Check BS at home at least once a day fasting and document for provider.  Lipid panel today, suspect elevations and would benefit from statin.  Return in 3 months to meet new PCP.  Would benefit from CCM referral at future visit.      Relevant Orders   Basic metabolic panel (Completed)   Lipid Panel w/o Chol/HDL Ratio (Completed)   TSH (Completed)   HgB A1c (Completed)   Microalbumin, Urine Waived (Completed)     Nervous and Auditory  Nicotine dependence, cigarettes, w unsp disorders    With diabetes and suspect some COPD.  I have recommended complete cessation of tobacco use. I have discussed various options available for assistance with tobacco cessation including over the counter methods (Nicotine gum, patch and lozenges). We also discussed prescription options (Chantix, Nicotine Inhaler / Nasal Spray). The patient is not interested in pursuing any prescription tobacco cessation options at this time.   Discussed lung screening and highly recommended this, he wishes to think about it.        Other   S/P BKA (below knee amputation) unilateral, left ([place])    I saw patient in the clinic. His current prosthesis is ill-fitting and causing a wound on residual limb. His limb shape has changed due to significant weight loss and limb maturation and because of this, does not fit correctly at this point. He wears over 15 ply socks but the prosthesis still doesn't fit correctly. It is medically necessary in my opinion for him to be fit with a new prosthesis that fits properly and is suitable to his new weight and body structure.       Chronic pain syndrome    Chronic, ongoing to multiple locations, including phantom pain syndrome.  He wishes new referral to pain management, but would like to see alternate provider.  Will place new referral at this time.  Refills on Gabapentin sent, renal dose as needed.  Recommend avoiding Ibuprofen products and use Tylenol as needed.      Relevant Medications   gabapentin (NEURONTIN) 600 MG tablet   Other Relevant Orders   DG Cervical Spine Complete   Ambulatory referral to Pain Clinic   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[place])    Refer to chronic pain syndrome plan of care.      Relevant Orders   Ambulatory referral to Pain Clinic   Morbid obesity ([place])    BMI 40.31 with T2DM.  Recommended eating smaller high protein, low fat meals more frequently and exercising 30 mins a day 5 times a week with a goal of 10-15lb weight loss in the next 3 months. Patient voiced their understanding and motivation to adhere to these recommendations.           Follow up plan: Return in about 3 months (around 06/09/2020) for T2DM, HTN/HLD, SMOKER.

## 2020-03-11 NOTE — Assessment & Plan Note (Addendum)
Chronic, ongoing.  Will obtain A1C today, suspect this will be elevated -- have discussed with patient will contact him with results and most likely recommend he restart medications.  He is agreeable to this plan.  Urine ALB today 150, would benefit from addition of ARB for kidney protection, but refuses to start at this time.  Would avoid ACE as highly suspect some underlying COPD present.  Recommend complete cessation of smoking.  Check BS at home at least once a day fasting and document for provider.  Lipid panel today, suspect elevations and would benefit from statin.  Return in 3 months to meet new PCP.  Would benefit from CCM referral at future visit.

## 2020-03-11 NOTE — Assessment & Plan Note (Signed)
Chronic, initial BPs elevated, but recheck closer to goal.  Would benefit from addition of ARB, such as Losartan, for kidney protection in diabetes, but patient wishes to hold off on this. Would avoid ACE as suspect some underlying COPD present.  Urine ALB today 150.  Will continue to discuss addition of ARB at next visit and start if patient agrees.  Recommend he monitor BP at home a few days a week and document for provider + focus on DASH diet.  BMP and TSH today.  Recommend complete cessation of smoking.  Return in 3 months to meet new PCP.

## 2020-03-11 NOTE — Assessment & Plan Note (Signed)
BMI 40.31 with T2DM.  Recommended eating smaller high protein, low fat meals more frequently and exercising 30 mins a day 5 times a week with a goal of 10-15lb weight loss in the next 3 months. Patient voiced their understanding and motivation to adhere to these recommendations.

## 2020-03-11 NOTE — Patient Instructions (Signed)

## 2020-03-12 ENCOUNTER — Other Ambulatory Visit: Payer: Self-pay | Admitting: Nurse Practitioner

## 2020-03-12 LAB — BASIC METABOLIC PANEL
BUN/Creatinine Ratio: 11 (ref 9–20)
BUN: 8 mg/dL (ref 6–24)
CO2: 24 mmol/L (ref 20–29)
Calcium: 9.9 mg/dL (ref 8.7–10.2)
Chloride: 91 mmol/L — ABNORMAL LOW (ref 96–106)
Creatinine, Ser: 0.74 mg/dL — ABNORMAL LOW (ref 0.76–1.27)
GFR calc Af Amer: 118 mL/min/{1.73_m2} (ref >59)
GFR calc non Af Amer: 102 mL/min/{1.73_m2} (ref >59)
Glucose: 298 mg/dL — ABNORMAL HIGH (ref 65–99)
Potassium: 4.6 mmol/L (ref 3.5–5.2)
Sodium: 131 mmol/L — ABNORMAL LOW (ref 134–144)

## 2020-03-12 LAB — LIPID PANEL W/O CHOL/HDL RATIO
Cholesterol, Total: 192 mg/dL (ref 100–199)
HDL: 41 mg/dL (ref >39)
LDL Chol Calc (NIH): 126 mg/dL — ABNORMAL HIGH (ref 0–99)
Triglycerides: 142 mg/dL (ref 0–149)
VLDL Cholesterol Cal: 25 mg/dL (ref 5–40)

## 2020-03-12 LAB — HEMOGLOBIN A1C
Est. average glucose Bld gHb Est-mCnc: 197 mg/dL
Hgb A1c MFr Bld: 8.5 % — ABNORMAL HIGH (ref 4.8–5.6)

## 2020-03-12 LAB — TSH: TSH: 2.48 u[IU]/mL (ref 0.450–4.500)

## 2020-03-12 MED ORDER — METFORMIN HCL 1000 MG PO TABS
1000.0000 mg | ORAL_TABLET | Freq: Two times a day (BID) | ORAL | 4 refills | Status: DC
Start: 2020-03-12 — End: 2020-08-20

## 2020-03-12 MED ORDER — EMPAGLIFLOZIN 10 MG PO TABS
10.0000 mg | ORAL_TABLET | Freq: Every day | ORAL | 4 refills | Status: DC
Start: 2020-03-12 — End: 2021-10-07

## 2020-03-12 MED ORDER — TRULICITY 0.75 MG/0.5ML ~~LOC~~ SOAJ
0.7500 mg | SUBCUTANEOUS | 4 refills | Status: DC
Start: 2020-03-12 — End: 2020-08-25

## 2020-03-12 NOTE — Progress Notes (Signed)
Prescription placed in your folder.

## 2020-03-12 NOTE — Progress Notes (Signed)
Contacted via MyChart  Good afternoon Mr. Ogan, your labs have returned.  A1C is above goal at 8.5%, I have resent all your diabetes medications and wish you to restart these.  Goal is to see that A1C <7%.  In long run a high level could affect many areas of your body.  Sodium level is on lower side, this is most likely due to your higher glucose (sugar) -- we will recheck all this next visit.  Thyroid is normal.  Cholesterol levels show high LDL (bad cholesterol), I do recommend starting a cholesterol medication called a statin, it is recommend all diabetics be on this for stroke prevention.  Would you be okay with starting Atorvastatin?  Please let me know.  Any questions? Keep being awesome!!  Thank you for allowing me to participate in your care. Kindest regards, Taylyn Brame

## 2020-03-17 ENCOUNTER — Encounter: Payer: Self-pay | Admitting: Nurse Practitioner

## 2020-04-07 ENCOUNTER — Telehealth: Payer: Self-pay | Admitting: Family Medicine

## 2020-04-07 NOTE — Telephone Encounter (Signed)
Added this in plan and addendum completed.

## 2020-04-07 NOTE — Telephone Encounter (Signed)
Can your office note be addended to add prescribe new prosthetic socket and something like the following  I saw patient in the clinic. His current prosthesis is ill-fitting and causing a wound on residual limb. His limb shape has changed due to significant weight loss and limb maturation and because of this, does not fit correctly at this point. He wears over 15 ply socks but the prosthesis still doesn't fit correctly. It is medically necessary in my opinion for him to be fit with a new prosthesis that fits properly and is suitable to his new weight and body structure.   From [facility] note from 07/2018  [phone]

## 2020-04-07 NOTE — Telephone Encounter (Signed)
Note faxed.

## 2020-04-07 NOTE — Telephone Encounter (Signed)
They need OV notes to go with the Rx for the prosthetic  / specific wording is needed for this order so the office can call them to get that info or template / there are specific terms and things that need to be stated   OV notes can be faxed to Fax# [phone]

## 2020-05-14 DIAGNOSIS — Z89512 Acquired absence of left leg below knee: Secondary | ICD-10-CM | POA: Diagnosis not present

## 2020-06-02 ENCOUNTER — Telehealth: Payer: Self-pay

## 2020-06-02 NOTE — Telephone Encounter (Signed)
Can you call and speak with provider

## 2020-06-02 NOTE — Telephone Encounter (Signed)
Called and spoke with Dr. Primus Bravo.  He wanted to let the office know that the patient has been non compliant and no showed for every appointment they have scheduled him for.  At this time, they do not plan to allow him to make further appointments.

## 2020-06-02 NOTE — Telephone Encounter (Signed)
Copied from [place] [id number]. Topic: General - Other >> Jun 01, 2020  3:28 PM Tessa Lerner A wrote: Reason for CRM: Patient was previously referred to see Dr. Louisa Second (ref # [id number])  Patient has missed two appts with Dr. Primus Bravo  Dr. Primus Bravo would like to make contact with patient's PCP to discuss further  Please contact to advise

## 2020-06-23 ENCOUNTER — Other Ambulatory Visit: Payer: Self-pay

## 2020-06-23 ENCOUNTER — Ambulatory Visit
Admission: EM | Admit: 2020-06-23 | Discharge: 2020-06-23 | Disposition: A | Discharge status: Home / Self Care | Payer: Medicaid Other | Attending: Family Medicine | Admitting: Family Medicine

## 2020-06-23 ENCOUNTER — Encounter: Payer: Self-pay | Admitting: Emergency Medicine

## 2020-06-23 DIAGNOSIS — E1165 Type 2 diabetes mellitus with hyperglycemia: Secondary | ICD-10-CM | POA: Diagnosis not present

## 2020-06-23 DIAGNOSIS — L539 Erythematous condition, unspecified: Secondary | ICD-10-CM | POA: Diagnosis not present

## 2020-06-23 DIAGNOSIS — I10 Essential (primary) hypertension: Secondary | ICD-10-CM | POA: Diagnosis not present

## 2020-06-23 DIAGNOSIS — M7989 Other specified soft tissue disorders: Secondary | ICD-10-CM | POA: Diagnosis not present

## 2020-06-23 DIAGNOSIS — R079 Chest pain, unspecified: Secondary | ICD-10-CM

## 2020-06-23 DIAGNOSIS — M79605 Pain in left leg: Secondary | ICD-10-CM | POA: Diagnosis not present

## 2020-06-23 DIAGNOSIS — R0789 Other chest pain: Secondary | ICD-10-CM | POA: Diagnosis not present

## 2020-06-23 DIAGNOSIS — L03116 Cellulitis of left lower limb: Secondary | ICD-10-CM | POA: Diagnosis not present

## 2020-06-23 NOTE — ED Notes (Signed)
Patient is being discharged from the Urgent Care and sent to the Emergency Department via private vehicle . Per Dr. Lacinda Axon, patient is in need of higher level of care due to chest pain. Patient is aware and verbalizes understanding of plan of care.  Vitals:   06/23/20 0934  BP: 118/89  Pulse: (!) 121  Resp: 15  Temp: 98.1 F (36.7 C)  SpO2: 100%

## 2020-06-23 NOTE — Discharge Instructions (Signed)
Go straight to the ER. You need a cardiac evaluation, labs, etc.  [address], [place], [place] [address]  Take care  Dr. Lacinda Axon

## 2020-06-23 NOTE — ED Triage Notes (Signed)
Pt states that he has centralized chest pain, numbness in the left shoulder, SOB. Pt states that he also has left thigh redness and irritation. Pt states that it is painful to walk. Pt states that he has a open wound on his right leg and also has an odor coming from the wound sight. Denies and bleeding but does have drainage. Pt states tat last Saturday.

## 2020-06-23 NOTE — ED Provider Notes (Addendum)
[facility]    CSN: [id number] Arrival date & time: 06/23/20  0919      History   Chief Complaint Chief Complaint  Patient presents with  . Chest Pain  . Shortness of Breath  . Leg Pain   HPI  60 year old male with hypertension, diabetes, morbid obesity presents with the above complaints.  Patient reports that his symptoms started on Saturday.  He states that he noticed redness and foul-smelling discharge of his left leg.  This has now worsened.  He denies any fever.  He states that yesterday he developed central chest pain.  He reports radiation to the left side of the neck.  He also reports associated back pain.  Also notes shortness of breath.  No relieving factors.  He feels very poorly.  His pain is currently 6/10 in severity.    Past Medical History:  Diagnosis Date  . Arthritis    hands  . Depression   . Diabetes mellitus without complication ([place])   . Dyspnea   . Hx of BKA ([place])    due to complicated fracture wears prosthesis  . Panic attack   . Wears dentures    upper and lower full plate    Patient Active Problem List   Diagnosis Date Noted  . Type 2 diabetes mellitus with proteinuria ([place]) 09/06/2018  . Hypertension associated with diabetes ([place]) 08/03/2018  . Long term current use of opiate analgesic 12/12/2017  . Morbid obesity ([place]) 09/11/2017  . Nicotine dependence, cigarettes, w unsp disorders 09/11/2017  . Lumbar spondylosis 09/11/2017  . Chronic sacroiliac joint pain 09/11/2017  . Chronic bilateral low back pain with left-sided sciatica 09/11/2017  . S/P BKA (below knee amputation) unilateral, left ([place]) 03/29/2017  . Chronic pain syndrome 03/29/2017  . 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[place]) 03/29/2017  . Chronic left hip pain 03/04/2017  . 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 Pain in joint involving ankle and foot 11/09/2011  . Primary localized osteoarthrosis of ankle and foot 10/03/2011    Past  Surgical History:  Procedure Laterality Date  . COLONOSCOPY WITH PROPOFOL N/A 09/27/2018   Procedure: COLONOSCOPY WITH BIOPSY;  Surgeon: Lucilla Lame, MD;  Location: [facility];  Service: Endoscopy;  Laterality: N/A;  . HAND SURGERY    . LEG AMPUTATION    . LEG SURGERY    . POLYPECTOMY N/A 09/27/2018   Procedure: POLYPECTOMY;  Surgeon: Lucilla Lame, MD;  Location: [facility];  Service: Endoscopy;  Laterality: N/A;       Home Medications    Prior to Admission medications   Medication Sig Start Date End Date Taking? Authorizing Provider  acetaminophen (TYLENOL) 500 MG tablet Take 500 mg by mouth every 6 (six) hours as needed.    [provider]  Dulaglutide (TRULICITY) 6.38 LH/7.3SK SOPN Inject 0.75 mg into the skin once a week. 03/12/20   Cannady, Henrine Screws T, NP  empagliflozin (JARDIANCE) 10 MG TABS tablet Take 1 tablet (10 mg total) by mouth daily before breakfast. 03/12/20   Marnee Guarneri T, NP  gabapentin (NEURONTIN) 600 MG tablet 1200 mg  BID 03/11/20   Cannady, Jolene T, NP  gentamicin cream (GARAMYCIN) 0.1 % Apply 1 application topically 3 (three) times daily. Patient not taking: No sig reported 03/12/19   Edrick Kins, DPM  metFORMIN (GLUCOPHAGE) 1000 MG tablet Take 1 tablet (1,000 mg total) by mouth 2 (two) times daily with a meal. 03/12/20   Cannady, Climax T,  NP    Family History Family History  Problem Relation Age of Onset  . Cancer Mother        Mastatic  . Diabetes Father   . Hypertension Father   . Heart disease Father   . Alzheimer's disease Maternal Grandmother   . Cancer Maternal Grandfather   . Cancer Paternal Grandmother     Social History Social History   Tobacco Use  . Smoking status: Current Every Day Smoker    Packs/day: 1.00    Years: 45.00    Pack years: 45.00    Types: Cigarettes  . Smokeless tobacco: Never Used  Vaping Use  . Vaping Use: Never used  Substance Use Topics  . Alcohol use: No  . Drug use: No      Allergies   Tramadol   Review of Systems Review of Systems Per HPI  Physical Exam Triage Vital Signs ED Triage Vitals  Enc Vitals Group     BP 06/23/20 0934 118/89     Pulse Rate 06/23/20 0934 (!) 121     Resp 06/23/20 0934 15     Temp 06/23/20 0934 98.1 F (36.7 C)     Temp Source 06/23/20 0934 Oral     SpO2 06/23/20 0934 100 %     Weight --      Height --      Head Circumference --      Peak Flow --      Pain Score 06/23/20 0932 6     Pain Loc --      Pain Edu? --      Excl. in [place]? --    Updated Vital Signs BP 118/89 (BP Location: Left Arm)   Pulse (!) 121   Temp 98.1 F (36.7 C) (Oral)   Resp 15   SpO2 100%   Visual Acuity Right Eye Distance:   Left Eye Distance:   Bilateral Distance:    Right Eye Near:   Left Eye Near:    Bilateral Near:     Physical Exam Vitals and nursing note reviewed.  Constitutional:      Comments: Chronically ill-appearing.  HENT:     Head: Normocephalic and atraumatic.  Eyes:     General:        Right eye: No discharge.        Left eye: No discharge.     Conjunctiva/sclera: Conjunctivae normal.  Cardiovascular:     Rate and Rhythm: Regular rhythm. Tachycardia present.  Pulmonary:     Effort: Pulmonary effort is normal.     Breath sounds: Normal breath sounds. No wheezing, rhonchi or rales.  Skin:    Comments: Erythema noted of the left lower leg.  Evidence of abrasion/open wound to the left lower leg at the distal portion (BKA).  He has erythema which extends proximally.  Warm to palpation.  Tender to palpation.  Neurological:     Mental Status: He is alert.  Psychiatric:        Mood and Affect: Mood normal.        Behavior: Behavior normal.    UC Treatments / Results  Labs (all labs ordered are listed, but only abnormal results are displayed) Labs Reviewed - No data to display  EKG Interpretation: Sinus tachycardia at the rate of 122.  Right bundle branch block.  No ST or T wave changes  noted.  Radiology No results found.  Procedures Procedures (including critical care time)  Medications Ordered in UC Medications - No data  to display  Initial Impression / Assessment and Plan / UC Course  I have reviewed the triage vital signs and the nursing notes.  Pertinent labs & imaging results that were available during my care of the patient were reviewed by me and considered in my medical decision making (see chart for details).    60 year old male with morbid obesity, type 2 diabetes with complications, hypertension presents with chest pain, shortness of breath, and left leg pain.  I have advised the patient that given his risk factors, he needs further evaluation and work-up in the emergency setting.  He is going to [facility] for further evaluation.  Final Clinical Impressions(s) / UC Diagnoses   Final diagnoses:  Chest pain, unspecified type  Left leg pain     Discharge Instructions     Go straight to the ER. You need a cardiac evaluation, labs, etc.  [address], [place], [place] [address]  Take care  Dr. Lacinda Axon    ED Prescriptions    None     PDMP not reviewed this encounter.   Coral Spikes, DO 06/23/20 [place], DO 06/23/20 1006

## 2020-06-23 NOTE — ED Notes (Signed)
Patient is being discharged from the Urgent Care and sent to the Emergency Department via POV . Per DR. Lacinda Axon, patient is in need of higher level of care due to chest pain, SOB. Patient is aware and verbalizes understanding of plan of care.  Vitals:   06/23/20 0934  BP: 118/89  Pulse: (!) 121  Resp: 15  Temp: 98.1 F (36.7 C)  SpO2: 100%

## 2020-08-20 ENCOUNTER — Ambulatory Visit
Admission: EM | Admit: 2020-08-20 | Discharge: 2020-08-20 | Disposition: A | Discharge status: Home / Self Care | Payer: Medicaid Other | Attending: Emergency Medicine | Admitting: Emergency Medicine

## 2020-08-20 ENCOUNTER — Other Ambulatory Visit: Payer: Self-pay

## 2020-08-20 ENCOUNTER — Ambulatory Visit (INDEPENDENT_AMBULATORY_CARE_PROVIDER_SITE_OTHER): Payer: Medicaid Other

## 2020-08-20 DIAGNOSIS — L02439 Carbuncle of limb, unspecified: Secondary | ICD-10-CM | POA: Diagnosis not present

## 2020-08-20 DIAGNOSIS — E1165 Type 2 diabetes mellitus with hyperglycemia: Secondary | ICD-10-CM | POA: Diagnosis not present

## 2020-08-20 DIAGNOSIS — N3000 Acute cystitis without hematuria: Secondary | ICD-10-CM | POA: Diagnosis not present

## 2020-08-20 DIAGNOSIS — J0100 Acute maxillary sinusitis, unspecified: Secondary | ICD-10-CM

## 2020-08-20 DIAGNOSIS — R059 Cough, unspecified: Secondary | ICD-10-CM | POA: Diagnosis not present

## 2020-08-20 DIAGNOSIS — R0602 Shortness of breath: Secondary | ICD-10-CM | POA: Diagnosis not present

## 2020-08-20 DIAGNOSIS — J439 Emphysema, unspecified: Secondary | ICD-10-CM | POA: Diagnosis not present

## 2020-08-20 LAB — CBC WITH DIFFERENTIAL/PLATELET
Abs Immature Granulocytes: 0.13 10*3/uL — ABNORMAL HIGH (ref 0.00–0.07)
Basophils Absolute: 0.1 10*3/uL (ref 0.0–0.1)
Basophils Relative: 1 %
Eosinophils Absolute: 0.2 10*3/uL (ref 0.0–0.5)
Eosinophils Relative: 2 %
HCT: 45 % (ref 39.0–52.0)
Hemoglobin: 15.9 g/dL (ref 13.0–17.0)
Immature Granulocytes: 1 %
Lymphocytes Relative: 21 %
Lymphs Abs: 3.1 10*3/uL (ref 0.7–4.0)
MCH: 29.2 pg (ref 26.0–34.0)
MCHC: 35.3 g/dL (ref 30.0–36.0)
MCV: 82.7 fL (ref 80.0–100.0)
Monocytes Absolute: 0.6 10*3/uL (ref 0.1–1.0)
Monocytes Relative: 4 %
Neutro Abs: 10.7 10*3/uL — ABNORMAL HIGH (ref 1.7–7.7)
Neutrophils Relative %: 71 %
Platelets: 267 10*3/uL (ref 150–400)
RBC: 5.44 MIL/uL (ref 4.22–5.81)
RDW: 13.3 % (ref 11.5–15.5)
WBC: 14.8 10*3/uL — ABNORMAL HIGH (ref 4.0–10.5)
nRBC: 0 % (ref 0.0–0.2)

## 2020-08-20 LAB — URINALYSIS, COMPLETE (UACMP) WITH MICROSCOPIC
Bilirubin Urine: NEGATIVE
Glucose, UA: 500 mg/dL — AB
Hgb urine dipstick: NEGATIVE
Ketones, ur: NEGATIVE mg/dL
Leukocytes,Ua: NEGATIVE
Nitrite: NEGATIVE
Protein, ur: NEGATIVE mg/dL
Specific Gravity, Urine: 1.01 (ref 1.005–1.030)
pH: 6 (ref 5.0–8.0)

## 2020-08-20 LAB — COMPREHENSIVE METABOLIC PANEL
ALT: 23 U/L (ref 0–44)
AST: 17 U/L (ref 15–41)
Albumin: 3.8 g/dL (ref 3.5–5.0)
Alkaline Phosphatase: 78 U/L (ref 38–126)
Anion gap: 8 (ref 5–15)
BUN: 9 mg/dL (ref 6–20)
CO2: 26 mmol/L (ref 22–32)
Calcium: 9.5 mg/dL (ref 8.9–10.3)
Chloride: 97 mmol/L — ABNORMAL LOW (ref 98–111)
Creatinine, Ser: 0.63 mg/dL (ref 0.61–1.24)
GFR, Estimated: >60 mL/min (ref >60)
Glucose, Bld: 453 mg/dL — ABNORMAL HIGH (ref 70–99)
Potassium: 4.4 mmol/L (ref 3.5–5.1)
Sodium: 131 mmol/L — ABNORMAL LOW (ref 135–145)
Total Bilirubin: 0.5 mg/dL (ref 0.3–1.2)
Total Protein: 8.4 g/dL — ABNORMAL HIGH (ref 6.5–8.1)

## 2020-08-20 LAB — GLUCOSE, CAPILLARY: Glucose-Capillary: 467 mg/dL — ABNORMAL HIGH (ref 70–99)

## 2020-08-20 LAB — HEMOGLOBIN A1C
Hgb A1c MFr Bld: 12.8 % — ABNORMAL HIGH (ref 4.8–5.6)
Mean Plasma Glucose: 320.66 mg/dL

## 2020-08-20 MED ORDER — ALBUTEROL SULFATE HFA 108 (90 BASE) MCG/ACT IN AERS: 2.0000 | INHALATION_SPRAY | RESPIRATORY_TRACT | 0 refills | Status: DC | PRN

## 2020-08-20 MED ORDER — SULFAMETHOXAZOLE-TRIMETHOPRIM 800-160 MG PO TABS: 1.0000 | ORAL_TABLET | Freq: Two times a day (BID) | ORAL | 0 refills | Status: AC

## 2020-08-20 MED ORDER — AEROCHAMBER MV MISC: 2 refills | Status: DC

## 2020-08-20 MED ORDER — METFORMIN HCL 500 MG PO TABS: 500.0000 mg | ORAL_TABLET | Freq: Two times a day (BID) | ORAL | 1 refills | Status: AC

## 2020-08-20 NOTE — Discharge Instructions (Addendum)
For your diabetes:  Resume the metformin 5 mg twice daily with meals.  For your urinary tract infection:  Take the Bactrim twice daily for 10 days with a full glass of water.  For your carbuncle:  Apply warm compresses to the swollen, painful area underneath your right arm as it has a head on it and this will help facilitate it expressing itself.  The Bactrim will also help resolve the infection.  I also believe that you have sinusitis and the Bactrim should help with that as well.  Your chest x-ray showed emphysema as well as an abnormality in your right collarbone which may be signs of degeneration or possibly cancer.  You need to make appointment with your primary care doctor to be reevaluated next week to make sure that you are infections are healing, your blood sugars coming under better control, and to arrange follow-up imaging for your abnormal chest x-ray.  I am also sending over prescription for an albuterol inhaler with a spacer to help you with your shortness of breath and wheezing.  You can have 2 puffs every 4-6 hours.  If you have a worsening of your shortness of breath or cough, start running fevers, or develop any chest pain you need to go to the ER for evaluation.

## 2020-08-20 NOTE — ED Provider Notes (Signed)
[facility]    CSN: [id number] Arrival date & time: 08/20/20  1130      History   Chief Complaint Chief Complaint  Patient presents with  . Generalized Body Aches    HPI Heberto Sturdevant. is a 60 y.o. male.   HPI   60 year old male here for evaluation of multiple muscle and joint aches, nausea, blurry vision, productive cough for green sputum with shortness of breath and wheezing, polyuria and urinary urgency.  Patient denies fever, vomiting, or diarrhea.  Patient reports that he has a history of diabetes as well as opioid dependence for which she detoxed himself off of all of his medication in June 2021.  He has not had any pain medication or diabetic medication for the last year.  Patient reports he does have a PCP and last saw her 4 months ago at which time she encouraged him to restart his diabetic medication.  Patient states that he is scared to start any other medication because his detox was so awful on him.  Patient states that he developed such a tolerance to pain medication that he resorted to getting his narcotics from the street to include Percocet and upon.  Patient denies any IV opioid or narcotic use.  Past Medical History:  Diagnosis Date  . Arthritis    hands  . Depression   . Diabetes mellitus without complication ([place])   . Dyspnea   . Hx of BKA ([place])    due to complicated fracture wears prosthesis  . Panic attack   . Wears dentures    upper and lower full plate    Patient Active Problem List   Diagnosis Date Noted  . Type 2 diabetes mellitus with proteinuria ([place]) 09/06/2018  . Hypertension associated with diabetes ([place]) 08/03/2018  . Long term current use of opiate analgesic 12/12/2017  . Morbid obesity ([place]) 09/11/2017  . Nicotine dependence, cigarettes, w unsp disorders 09/11/2017  . Lumbar spondylosis 09/11/2017  . Chronic sacroiliac joint pain 09/11/2017  . Chronic bilateral low back pain with left-sided sciatica 09/11/2017  .  S/P BKA (below knee amputation) unilateral, left ([place]) 03/29/2017  . Chronic pain syndrome 03/29/2017  . 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[place]) 03/29/2017  . Chronic left hip pain 03/04/2017  . 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 Pain in joint involving ankle and foot 11/09/2011  . Primary localized osteoarthrosis of ankle and foot 10/03/2011    Past Surgical History:  Procedure Laterality Date  . COLONOSCOPY WITH PROPOFOL N/A 09/27/2018   Procedure: COLONOSCOPY WITH BIOPSY;  Surgeon: Lucilla Lame, MD;  Location: [place];  Service: Endoscopy;  Laterality: N/A;  . HAND SURGERY    . LEG AMPUTATION    . LEG SURGERY    . POLYPECTOMY N/A 09/27/2018   Procedure: POLYPECTOMY;  Surgeon: Lucilla Lame, MD;  Location: [place];  Service: Endoscopy;  Laterality: N/A;       Home Medications    Prior to Admission medications   Medication Sig Start Date End Date Taking? Authorizing Provider  albuterol (VENTOLIN HFA) 108 (90 Base) MCG/ACT inhaler Inhale 2 puffs into the lungs every 4 (four) hours as needed. 08/20/20  Yes Margarette Canada, NP  metFORMIN (GLUCOPHAGE) 500 MG tablet Take 1 tablet (500 mg total) by mouth 2 (two) times daily with a meal. 08/20/20 09/19/20 Yes Margarette Canada, NP  Spacer/Aero-Holding Josiah Lobo (AEROCHAMBER MV) inhaler Use as instructed 08/20/20  Yes Margarette Canada, NP  sulfamethoxazole-trimethoprim (  BACTRIM DS) 800-160 MG tablet Take 1 tablet by mouth 2 (two) times daily for 10 days. 08/20/20 08/30/20 Yes Margarette Canada, NP  acetaminophen (TYLENOL) 500 MG tablet Take 500 mg by mouth every 6 (six) hours as needed.    [provider]  Dulaglutide (TRULICITY) 3.81 WE/9.9BZ SOPN Inject 0.75 mg into the skin once a week. 03/12/20   Cannady, Henrine Screws T, NP  empagliflozin (JARDIANCE) 10 MG TABS tablet Take 1 tablet (10 mg total) by mouth daily before breakfast. 03/12/20   Marnee Guarneri T, NP  gabapentin (NEURONTIN) 600 MG tablet 1200  mg  BID 03/11/20   Cannady, Jolene T, NP  gentamicin cream (GARAMYCIN) 0.1 % Apply 1 application topically 3 (three) times daily. Patient not taking: No sig reported 03/12/19   Edrick Kins, DPM    Family History Family History  Problem Relation Age of Onset  . Cancer Mother        Mastatic  . Diabetes Father   . Hypertension Father   . Heart disease Father   . Alzheimer's disease Maternal Grandmother   . Cancer Maternal Grandfather   . Cancer Paternal Grandmother     Social History Social History   Tobacco Use  . Smoking status: Current Every Day Smoker    Packs/day: 1.00    Years: 45.00    Pack years: 45.00    Types: Cigarettes  . Smokeless tobacco: Never Used  Vaping Use  . Vaping Use: Never used  Substance Use Topics  . Alcohol use: No  . Drug use: No     Allergies   Tramadol   Review of Systems Review of Systems  Constitutional: Positive for fatigue. Negative for activity change, appetite change and fever.  HENT: Positive for congestion, rhinorrhea and sore throat. Negative for ear pain.   Eyes: Positive for visual disturbance.  Respiratory: Positive for cough, shortness of breath and wheezing.   Gastrointestinal: Negative for abdominal pain, diarrhea and vomiting.  Endocrine: Positive for polyuria.  Genitourinary: Positive for urgency. Negative for decreased urine volume, difficulty urinating, dysuria and hematuria.  Musculoskeletal: Positive for arthralgias and myalgias.  Skin: Positive for rash.  Hematological: Negative.   Psychiatric/Behavioral: Negative.      Physical Exam Triage Vital Signs ED Triage Vitals  Enc Vitals Group     BP 08/20/20 1146 (!) 125/92     Pulse Rate 08/20/20 1146 (!) 106     Resp 08/20/20 1146 18     Temp 08/20/20 1146 98.4 F (36.9 C)     Temp Source 08/20/20 1146 Oral     SpO2 08/20/20 1146 100 %     Weight 08/20/20 1144 268 lb (121.6 kg)     Height 08/20/20 1144 6' (1.829 m)     Head Circumference --      Peak  Flow --      Pain Score 08/20/20 1143 8     Pain Loc --      Pain Edu? --      Excl. in [place]? --    No data found.  Updated Vital Signs BP (!) 125/92 (BP Location: Left Arm)   Pulse (!) 106   Temp 98.4 F (36.9 C) (Oral)   Resp 18   Ht 6' (1.829 m)   Wt 268 lb (121.6 kg)   SpO2 100%   BMI 36.35 kg/m   Visual Acuity Right Eye Distance:   Left Eye Distance:   Bilateral Distance:    Right Eye Near:   Left  Eye Near:    Bilateral Near:     Physical Exam Vitals and nursing note reviewed.  Constitutional:      Appearance: He is obese. He is ill-appearing.  HENT:     Head: Normocephalic and atraumatic.     Right Ear: Tympanic membrane, ear canal and external ear normal. There is no impacted cerumen.     Left Ear: Tympanic membrane, ear canal and external ear normal. There is no impacted cerumen.     Nose: Congestion and rhinorrhea present.     Mouth/Throat:     Mouth: Mucous membranes are moist.     Pharynx: Oropharynx is clear. Posterior oropharyngeal erythema present.  Cardiovascular:     Rate and Rhythm: Normal rate and regular rhythm.     Pulses: Normal pulses.     Heart sounds: Normal heart sounds. No murmur heard. No gallop.   Pulmonary:     Effort: Pulmonary effort is normal.     Breath sounds: Normal breath sounds. No wheezing, rhonchi or rales.  Musculoskeletal:     Cervical back: Normal range of motion and neck supple.  Lymphadenopathy:     Cervical: No cervical adenopathy.  Skin:    General: Skin is warm and dry.     Capillary Refill: Capillary refill takes less than 2 seconds.     Findings: Erythema and lesion present.  Neurological:     General: No focal deficit present.     Mental Status: He is alert and oriented to person, place, and time.  Psychiatric:        Mood and Affect: Mood normal.        Behavior: Behavior normal.        Thought Content: Thought content normal.        Judgment: Judgment normal.      UC Treatments / Results   Labs (all labs ordered are listed, but only abnormal results are displayed) Labs Reviewed  GLUCOSE, CAPILLARY - Abnormal; Notable for the following components:      Result Value   Glucose-Capillary 467 (*)    All other components within normal limits  CBC WITH DIFFERENTIAL/PLATELET - Abnormal; Notable for the following components:   WBC 14.8 (*)    Neutro Abs 10.7 (*)    Abs Immature Granulocytes 0.13 (*)    All other components within normal limits  COMPREHENSIVE METABOLIC PANEL - Abnormal; Notable for the following components:   Sodium 131 (*)    Chloride 97 (*)    Glucose, Bld 453 (*)    Total Protein 8.4 (*)    All other components within normal limits  URINALYSIS, COMPLETE (UACMP) WITH MICROSCOPIC - Abnormal; Notable for the following components:   APPearance HAZY (*)    Glucose, UA 500 (*)    Bacteria, UA RARE (*)    All other components within normal limits  URINE CULTURE  HEMOGLOBIN A1C  CBG MONITORING, ED    EKG   Radiology DG Chest 2 View  Result Date: 08/20/2020 CLINICAL DATA:  Cough Shortness of breath Smoker EXAM: CHEST - 2 VIEW COMPARISON:  01/01/2017 FINDINGS: Cardiomediastinal silhouette and pulmonary vasculature are within normal limits. Lungs are hyperexpanded, but otherwise clear clear. Interval development of a 11 mm lucent lesion in the lateral head of the right clavicle. IMPRESSION: 1. No acute cardiopulmonary process 2. Emphysema 3. Interval development of an 11 mm lucent lesion in the lateral head of the right clavicle. While this may be the result of degenerative disease, a lytic lesion  related to metastatic disease or multiple myeloma is not excluded. Electronically Signed   By: Miachel Roux M.D.   On: 08/20/2020 12:45    Procedures Procedures (including critical care time)  Medications Ordered in UC Medications - No data to display  Initial Impression / Assessment and Plan / UC Course  I have reviewed the triage vital signs and the nursing  notes.  Pertinent labs & imaging results that were available during my care of the patient were reviewed by me and considered in my medical decision making (see chart for details).   Patient is a very pleasant though ill-appearing 60 year old male here for evaluation of multiple complaints to include multiple joint and muscle aches, nausea, blurry vision, productive cough for green sputum with shortness of breath and wheezing, polyuria with urinary urgency, and multiple skin sores.  Patient is a diabetic has not been on any diabetic medication for a year since he detox himself off of his pain medication and the rest of his medications.  Patient does have a PCP who he last saw 4 months ago.  Patient's physical exam reveals pearly gray tympanic membranes bilaterally with a normal light reflex and clear external auditory canals.  Nasal mucosa is erythematous and edematous with purulent nasal discharge in both nares.  Maxillary sinuses are tender to percussion.  Posterior oropharynx is erythematous without cobblestoning.  Tonsillar pillars are unremarkable.  No cervical lymphadenopathy.  Lungs are clear to auscultation all fields.  Patient has a small abscess with a head on it underneath his right axilla and there is a skin tag and multiple sores on his right lower leg and left arm.  The sores are flat and red without heat.  They look like old bites.  Patient's right leg is red but it is not hot or swollen.  DP PT pulses are 2+.  Patient is an AKA on the left secondary to a fall 20 years ago.  I was alerted by Tsch[place] the medical assistant that patient's fingerstick glucose was 467.  Will check CBC, CMP, urinalysis, hemoglobin A1c, and chest x-ray.  CBC has a mildly elevated white count of 14.8, H&H is 15.9 and 45, and platelets are 267.  CMP shows mild hyponatremia with a sodium of 131, elevated glucose of 453, BUN is 9 and creatinine is 0.63 and transaminases are normal.  Chest x-ray reviewed and  independently evaluated by me.  Interpretation: No evidence of pneumothorax or infiltrate.  Awaiting radiology overread.  Radiology overread: No acute cardiopulmonary process 2. Emphysema 3. Interval development of an 11 mm lucent lesion in the lateral head of the right clavicle. While this may be the result of degenerative disease, a lytic lesion related to metastatic disease or multiple myeloma is not excluded  Urinalysis shows hazy appearance with 500 glucose.  21-50 WBCs but is negative for leukocyte esterase or nitrites, rare bacteria.  Will send urine for culture.  We will discharge patient home with a diagnosis of diabetes and on metformin 5 mg twice daily and have him increase it slowly over the next 2 to 4 weeks to without milligrams twice daily as previously directed.  Patient also has a small carbuncle in his right axilla that has ahead and is ready to rupture.  We will have patient treat that with warm compresses until he can get it to express its own.  We will treat patient's UTI in the carbuncle with Bactrim twice daily for 10 days.  This will also treat his sinusitis.  We will have patient follow-up with his primary care provider to reestablish his full therapeutic regimen list for further work-up of the abnormalities found on chest x-ray..  ER precautions reviewed with patient.   Final Clinical Impressions(s) / UC Diagnoses   Final diagnoses:  Acute non-recurrent maxillary sinusitis  Acute cystitis without hematuria  Type 2 diabetes mellitus with hyperglycemia, unspecified whether long term insulin use (HCC)  Carbuncle of axilla     Discharge Instructions     For your diabetes:  Resume the metformin 5 mg twice daily with meals.  For your urinary tract infection:  Take the Bactrim twice daily for 10 days with a full glass of water.  For your carbuncle:  Apply warm compresses to the swollen, painful area underneath your right arm as it has a head on it and this will  help facilitate it expressing itself.  The Bactrim will also help resolve the infection.  I also believe that you have sinusitis and the Bactrim should help with that as well.  Your chest x-ray showed emphysema as well as an abnormality in your right collarbone which may be signs of degeneration or possibly cancer.  You need to make appointment with your primary care doctor to be reevaluated next week to make sure that you are infections are healing, your blood sugars coming under better control, and to arrange follow-up imaging for your abnormal chest x-ray.  I am also sending over prescription for an albuterol inhaler with a spacer to help you with your shortness of breath and wheezing.  You can have 2 puffs every 4-6 hours.  If you have a worsening of your shortness of breath or cough, start running fevers, or develop any chest pain you need to go to the ER for evaluation.    ED Prescriptions    Medication Sig Dispense Auth. Provider   metFORMIN (GLUCOPHAGE) 500 MG tablet Take 1 tablet (500 mg total) by mouth 2 (two) times daily with a meal. 60 tablet Margarette Canada, NP   sulfamethoxazole-trimethoprim (BACTRIM DS) 800-160 MG tablet Take 1 tablet by mouth 2 (two) times daily for 10 days. 20 tablet Margarette Canada, NP   albuterol (VENTOLIN HFA) 108 (90 Base) MCG/ACT inhaler Inhale 2 puffs into the lungs every 4 (four) hours as needed. 18 g Margarette Canada, NP   Spacer/Aero-Holding Chambers (AEROCHAMBER MV) inhaler Use as instructed 1 each Margarette Canada, NP     PDMP not reviewed this encounter.   Margarette Canada, NP 08/20/20 1317

## 2020-08-20 NOTE — ED Triage Notes (Addendum)
Pt c/o muscle and joint aches, multiple sores on his body, nausea for about a week and a half. Pt states the muscle/joint pain is increasing over the past few days. Pt also reports he has had increased thirst. Pt reports he does have DM but does not check his blood sugar. Pt denies f/v/d or other symptoms. Pt states he is concerned about "sepsis in his blood".  Pt states he has not had any of his DM meds since last June/2021.

## 2020-08-22 LAB — URINE CULTURE
Culture: 50000 — AB
Special Requests: NORMAL

## 2020-08-24 ENCOUNTER — Other Ambulatory Visit: Payer: Self-pay

## 2020-08-24 ENCOUNTER — Ambulatory Visit: Payer: Medicaid Other | Admitting: Physician Assistant

## 2020-08-24 DIAGNOSIS — Z113 Encounter for screening for infections with a predominantly sexual mode of transmission: Secondary | ICD-10-CM

## 2020-08-24 DIAGNOSIS — Z202 Contact with and (suspected) exposure to infections with a predominantly sexual mode of transmission: Secondary | ICD-10-CM

## 2020-08-24 MED ORDER — PENICILLIN G BENZATHINE 1200000 UNIT/2ML IM SUSY
2.4000 10*6.[IU] | PREFILLED_SYRINGE | Freq: Once | INTRAMUSCULAR | Status: AC
  Administered 2020-08-24: 2.4 10*6.[IU] via INTRAMUSCULAR

## 2020-08-24 NOTE — Progress Notes (Signed)
Patient given Bicillin 2.4 mu, patient tolerated well, no complications patient stayed and walked for 15 minutes post injection.

## 2020-08-25 ENCOUNTER — Other Ambulatory Visit: Payer: Self-pay

## 2020-08-25 ENCOUNTER — Ambulatory Visit: Payer: Medicaid Other | Admitting: Nurse Practitioner

## 2020-08-25 ENCOUNTER — Encounter: Payer: Self-pay | Admitting: Nurse Practitioner

## 2020-08-25 VITALS — BP 124/81 | HR 117 | Temp 97.2°F | Wt 265.0 lb

## 2020-08-25 DIAGNOSIS — I152 Hypertension secondary to endocrine disorders: Secondary | ICD-10-CM

## 2020-08-25 DIAGNOSIS — M25811 Other specified joint disorders, right shoulder: Secondary | ICD-10-CM | POA: Insufficient documentation

## 2020-08-25 DIAGNOSIS — E1129 Type 2 diabetes mellitus with other diabetic kidney complication: Secondary | ICD-10-CM | POA: Diagnosis not present

## 2020-08-25 DIAGNOSIS — E1159 Type 2 diabetes mellitus with other circulatory complications: Secondary | ICD-10-CM | POA: Diagnosis not present

## 2020-08-25 DIAGNOSIS — G894 Chronic pain syndrome: Secondary | ICD-10-CM

## 2020-08-25 DIAGNOSIS — E7800 Pure hypercholesterolemia, unspecified: Secondary | ICD-10-CM | POA: Insufficient documentation

## 2020-08-25 DIAGNOSIS — R809 Proteinuria, unspecified: Secondary | ICD-10-CM

## 2020-08-25 MED ORDER — TRULICITY 0.75 MG/0.5ML ~~LOC~~ SOAJ: 0.7500 mg | SUBCUTANEOUS | 4 refills | Status: DC

## 2020-08-25 MED ORDER — GABAPENTIN 600 MG PO TABS: ORAL_TABLET | ORAL | 2 refills | Status: DC

## 2020-08-25 NOTE — Assessment & Plan Note (Signed)
Chronic.  Controlled off medication.  Continue with work on [employer]. Recommend checking blood pressures at home.  RTC if blood pressures are >140/90.

## 2020-08-25 NOTE — Assessment & Plan Note (Signed)
XRAY from ED visit shows possible degenerative disease but not able to full out Multiple Myeloma.  MRI ordered for evaluation of mass.  Patient does not want to go back and see pain management due to history of opioid abuse.  Patient has not started Gabapentin.  Refill sent for patient today.  Patient agrees to start medication. Follow up in 1 month for reevaluation of pain. 

## 2020-08-25 NOTE — Assessment & Plan Note (Signed)
Chronic.  Uncontrolled.  Patient has restarted Metformin. Agrees to restart Trulicity. Does not check sugars at home. Return in 1 month for adjustment of medications.

## 2020-08-25 NOTE — Progress Notes (Signed)
BP 124/81   Pulse (!) 117   Temp (!) 97.2 F (36.2 C)   Wt 265 lb (120.2 kg)   SpO2 98%   BMI 35.94 kg/m    Subjective:    Patient ID: [name] Amy., male    DOB: 04/01/1961, 60 y.o.   MRN: [id number]  HPI: [name] Sturdevant. is a 60 y.o. male  Chief Complaint  Patient presents with  . Diabetes   DIABETES Patient has restarted his Metformin.  Hypoglycemic episodes:no Polydipsia/polyuria: yes Visual disturbance: no Chest pain: no Paresthesias: yes Glucose Monitoring: no  Accucheck frequency: Not Checking  Fasting glucose:  Post prandial:  Evening:  Before meals: Taking Insulin?: no  Long acting insulin:  Short acting insulin: Blood Pressure Monitoring: not checking Retinal Examination: Not up to Date Foot Exam: Up to Date Diabetic Education: Not Completed Pneumovax: Not up to Date Influenza: Not up to Date Aspirin: no  HYPERTENSION / HYPERLIPIDEMIA Satisfied with current treatment? no Duration of hypertension: years BP monitoring frequency: not checking BP range:  BP medication side effects: no Past BP meds: none Duration of hyperlipidemia: years Cholesterol medication side effects: no Cholesterol supplements: none Past cholesterol medications: none Medication compliance: poor compliance Aspirin: no Recent stressors: no Recurrent headaches: no Visual changes: yes Palpitations: no Dyspnea: yes Chest pain: no Lower extremity edema: no Dizzy/lightheaded: no  CHRONIC PAIN Patient states he hurts in shoulders and hands. 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   Relevant past medical, surgical, family and social history reviewed and updated as indicated. Interim medical history since our last visit reviewed. Allergies and medications reviewed and updated.  Review of  Systems  Eyes: Positive for visual disturbance.  Respiratory: Positive for shortness of breath. Negative for chest tightness.   Cardiovascular: Negative for chest pain, palpitations and leg swelling.  Endocrine: Positive for polydipsia and polyuria.  Neurological: Positive for numbness. Negative for dizziness, light-headedness and headaches.    Per HPI unless specifically indicated above     Objective:    BP 124/81   Pulse (!) 117   Temp (!) 97.2 F (36.2 C)   Wt 265 lb (120.2 kg)   SpO2 98%   BMI 35.94 kg/m   Wt Readings from Last 3 Encounters:  08/25/20 265 lb (120.2 kg)  08/20/20 268 lb (121.6 kg)  03/11/20 297 lb 3.2 oz (134.8 kg)    Physical Exam Vitals and nursing note reviewed.  Constitutional:      General: He is not in acute distress.    Appearance: Normal appearance. He is not ill-appearing, toxic-appearing or diaphoretic.  HENT:     Head: Normocephalic.     Right Ear: External ear normal.     Left Ear: External ear normal.     Nose: Nose normal. No congestion or rhinorrhea.     Mouth/Throat:     Mouth: Mucous membranes are moist.  Eyes:     General:        Right eye: No discharge.        Left eye: No discharge.     Extraocular Movements: Extraocular movements intact.     Conjunctiva/sclera: Conjunctivae normal.     Pupils: Pupils are equal, round, and reactive to light.  Cardiovascular:     Rate and Rhythm: Regular rhythm.  Tachycardia present.     Heart sounds: No murmur heard.   Pulmonary:     Effort: Pulmonary effort is normal. No respiratory distress.     Breath sounds: Wheezing present. No rhonchi or rales.  Abdominal:     General: Abdomen is flat. Bowel sounds are normal.  Musculoskeletal:        General: No swelling or tenderness.     Cervical back: Normal range of motion and neck supple.     Comments: Limited shoulder ROM bilaterally.  Skin:    General: Skin is warm and dry.     Capillary Refill: Capillary refill takes less than 2  seconds.  Neurological:     General: No focal deficit present.     Mental Status: He is alert and oriented to person, place, and time.  Psychiatric:        Mood and Affect: Mood normal.        Behavior: Behavior normal.        Thought Content: Thought content normal.        Judgment: Judgment normal.     Results for orders placed or performed during the hospital encounter of 08/20/20  Urine culture   Specimen: Urine, Clean Catch  Result Value Ref Range   Specimen Description      URINE, CLEAN CATCH Performed at [facility], [address] [place], [place] [address]    Special Requests      Normal Performed at [facility], [address], [place], [place] [address]    Culture (A)     50,000 COLONIES/mL STREPTOCOCCUS AGALACTIAE TESTING AGAINST S. AGALACTIAE NOT ROUTINELY PERFORMED DUE TO PREDICTABILITY OF AMP/PEN/VAN SUSCEPTIBILITY. Performed at [facility], [place] [address], [place], [place] [address]    Report Status 08/22/2020 FINAL   Glucose, capillary  Result Value Ref Range   Glucose-Capillary 467 (H) 70 - 99 mg/dL  CBC with Differential  Result Value Ref Range   WBC 14.8 (H) 4.0 - 10.5 K/uL   RBC 5.44 4.22 - 5.81 MIL/uL   Hemoglobin 15.9 13.0 - 17.0 g/dL   HCT 45.0 39.0 - 52.0 %   MCV 82.7 80.0 - 100.0 fL   MCH 29.2 26.0 - 34.0 pg   MCHC 35.3 30.0 - 36.0 g/dL   RDW 13.3 11.5 - 15.5 %   Platelets 267 150 - 400 K/uL   nRBC 0.0 0.0 - 0.2 %   Neutrophils Relative % 71 %   Neutro Abs 10.7 (H) 1.7 - 7.7 K/uL   Lymphocytes Relative 21 %   Lymphs Abs 3.1 0.7 - 4.0 K/uL   Monocytes Relative 4 %   Monocytes Absolute 0.6 0.1 - 1.0 K/uL   Eosinophils Relative 2 %   Eosinophils Absolute 0.2 0.0 - 0.5 K/uL   Basophils Relative 1 %   Basophils Absolute 0.1 0.0 - 0.1 K/uL   Immature Granulocytes 1 %   Abs Immature Granulocytes 0.13 (H) 0.00 - 0.07 K/uL  Comprehensive metabolic panel  Result Value Ref Range   Sodium 131 (L) 135 - 145  mmol/L   Potassium 4.4 3.5 - 5.1 mmol/L   Chloride 97 (L) 98 - 111 mmol/L   CO2 26 22 - 32 mmol/L   Glucose, Bld 453 (H) 70 - 99 mg/dL   BUN 9 6 - 20 mg/dL   Creatinine, Ser 0.63 0.61 - 1.24 mg/dL   Calcium 9.5 8.9 - 10.3 mg/dL   Total Protein 8.4 (H) 6.5 -  8.1 g/dL   Albumin 3.8 3.5 - 5.0 g/dL   AST 17 15 - 41 U/L   ALT 23 0 - 44 U/L   Alkaline Phosphatase 78 38 - 126 U/L   Total Bilirubin 0.5 0.3 - 1.2 mg/dL   GFR, Estimated >60 >60 mL/min   Anion gap 8 5 - 15  Urinalysis, Complete w Microscopic Urine, Clean Catch  Result Value Ref Range   Color, Urine YELLOW YELLOW   APPearance HAZY (A) CLEAR   Specific Gravity, Urine 1.010 1.005 - 1.030   pH 6.0 5.0 - 8.0   Glucose, UA 500 (A) NEGATIVE mg/dL   Hgb urine dipstick NEGATIVE NEGATIVE   Bilirubin Urine NEGATIVE NEGATIVE   Ketones, ur NEGATIVE NEGATIVE mg/dL   Protein, ur NEGATIVE NEGATIVE mg/dL   Nitrite NEGATIVE NEGATIVE   Leukocytes,Ua NEGATIVE NEGATIVE   Squamous Epithelial / LPF 0-5 0 - 5   WBC, UA 21-50 0 - 5 WBC/hpf   RBC / HPF 0-5 0 - 5 RBC/hpf   Bacteria, UA RARE (A) NONE SEEN  Hemoglobin A1c  Result Value Ref Range   Hgb A1c MFr Bld 12.8 (H) 4.8 - 5.6 %   Mean Plasma Glucose 320.66 mg/dL      Assessment & Plan:   Problem List Items Addressed This Visit      Cardiovascular and Mediastinum   Hypertension associated with diabetes ([place]) - Primary    Chronic.  Controlled off medication.  Continue with work on [employer]. Recommend checking blood pressures at home.  RTC if blood pressures are >140/90.      Relevant Medications   Dulaglutide (TRULICITY) 9.62 IW/9.7LG SOPN     Endocrine   Type 2 diabetes mellitus with proteinuria (H[facility])    Chronic.  Uncontrolled.  Patient has restarted Metformin. Agrees to restart Trulicity. Does not check sugars at home. Return in 1 month for adjustment of medications.       Relevant Medications   Dulaglutide (TRULICITY) 9.21 JH/4.1DE SOPN     Other   Chronic pain syndrome     XRAY from ED visit shows possible degenerative disease but not able to full out Multiple Myeloma.  MRI ordered for evaluation of mass.  Patient does not want to go back and see pain management due to history of opioid abuse.  Patient has not started Gabapentin.  Refill sent for patient today.  Patient agrees to start medication. Follow up in 1 month for reevaluation of pain.      Relevant Medications   gabapentin (NEURONTIN) 600 MG tablet   Mass of joint of right shoulder    XRAY from ED visit shows possible degenerative disease but not able to full out Multiple Myeloma.  MRI ordered for evaluation of mass.  Patient does not want to go back and see pain management due to history of opioid abuse.  Patient has not started Gabapentin.  Refill sent for patient today.  Patient agrees to start medication. Follow up in 1 month for reevaluation of pain.      Relevant Orders   MR Shoulder Right Wo Contrast       Follow up plan: Return in about 1 month (around 09/25/2020) for Pain Follow up.

## 2020-08-25 NOTE — Assessment & Plan Note (Signed)
XRAY from ED visit shows possible degenerative disease but not able to full out Multiple Myeloma.  MRI ordered for evaluation of mass.  Patient does not want to go back and see pain management due to history of opioid abuse.  Patient has not started Gabapentin.  Refill sent for patient today.  Patient agrees to start medication. Follow up in 1 month for reevaluation of pain.

## 2020-08-25 NOTE — Patient Instructions (Signed)

## 2020-08-26 ENCOUNTER — Encounter: Payer: Self-pay | Admitting: Physician Assistant

## 2020-08-26 NOTE — Progress Notes (Signed)
[facility] Department STI clinic/screening visit  Subjective:  [name] Kauffman. is a 61 y.o. male being seen today for an STI screening visit. The patient reports they do not have symptoms.    Patient has the following medical conditions:   Patient Active Problem List   Diagnosis Date Noted  . Hypercholesteremia 08/25/2020  . Mass of joint of right shoulder 08/25/2020  . Type 2 diabetes mellitus with proteinuria ([place]) 09/06/2018  . Hypertension associated with diabetes ([place]) 08/03/2018  . Long term current use of opiate analgesic 12/12/2017  . Morbid obesity ([place]) 09/11/2017  . Nicotine dependence, cigarettes, w unsp disorders 09/11/2017  . Lumbar spondylosis 09/11/2017  . Chronic sacroiliac joint pain 09/11/2017  . Chronic bilateral low back pain with left-sided sciatica 09/11/2017  . S/P BKA (below knee amputation) unilateral, left ([place]) 03/29/2017  . Chronic pain syndrome 03/29/2017  . 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 Chronic left hip pain 03/04/2017  . 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 Pain in joint involving ankle and foot 11/09/2011  . Primary localized osteoarthrosis of ankle and foot 10/03/2011     Chief Complaint  Patient presents with  . SEXUALLY TRANSMITTED DISEASE    Screening     HPI  Patient reports that he is not having any symptoms but was told that he is a contact to Syphilis.  Denies any symptoms related to Syphilis but states that he has a "blood infection" due to his stump getting chafed and he is currently on 2 antibiotics for this infection.  Reports history of Arthritis, DM, HTN and BKA on the left.  States last HIV test was 2 yr ago.     See flowsheet for further details and programmatic requirements.    The following portions of the patient's history were reviewed and updated as appropriate: allergies, current medications, past medical history, past social history, past  surgical history and problem list.  Objective:  There were no vitals filed for this visit.  Physical Exam Constitutional:      General: He is not in acute distress.    Appearance: Normal appearance.  HENT:     Head: Normocephalic and atraumatic.     Mouth/Throat:     Mouth: Mucous membranes are moist.     Pharynx: Oropharynx is clear. No oropharyngeal exudate or posterior oropharyngeal erythema.  Eyes:     Conjunctiva/sclera: Conjunctivae normal.  Pulmonary:     Effort: Pulmonary effort is normal.  Musculoskeletal:     Cervical back: Neck supple. No tenderness.  Lymphadenopathy:     Cervical: No cervical adenopathy.  Skin:    General: Skin is warm and dry.     Findings: No bruising, erythema, lesion or rash.  Neurological:     Mental Status: He is alert and oriented to person, place, and time.  Psychiatric:        Mood and Affect: Mood normal.        Behavior: Behavior normal.        Thought Content: Thought content normal.        Judgment: Judgment normal.       Assessment and Plan:  [name] Messmer. is a 60 y.o. male presenting to the [facility] Department for STI screening  1. Screening for STD (sexually transmitted disease) Patient into clinic without symptoms. Patient declines screening for GC/NGU today.  Rec condoms with all sex. Await test results.  Counseled that RN  will call if needs to RTC for treatment once results are back. - HIV/HCV [facility] - Syphilis Serology, [facility]  2. Syphilis contact Treat as a contact to Syphilis with Bicillin 2.4 mu IM today. Counseled patient re: normal SE after injections and to use OTC analgesics and warm compresses as needed for SE. Counseled that he should not have sex for 14 days and until we get results back and determine whether he will need any more treatment. - penicillin g benzathine (BICILLIN LA) 1200000 UNIT/2ML injection 2.4 Million Units     No follow-ups on file.  Future  Appointments  Date Time Provider [place]  09/25/2020  1:00 PM Jon Billings, NP [facility] [place], [place]

## 2020-08-28 LAB — HM HIV SCREENING LAB: HM HIV Screening: NEGATIVE

## 2020-08-28 LAB — HM HEPATITIS C SCREENING LAB: HM Hepatitis Screen: NEGATIVE

## 2020-09-11 ENCOUNTER — Ambulatory Visit: Admission: RE | Admit: 2020-09-11 | Payer: Medicaid Other | Source: Ambulatory Visit

## 2020-09-25 ENCOUNTER — Ambulatory Visit: Payer: Medicaid Other | Admitting: Nurse Practitioner

## 2020-09-25 NOTE — Progress Notes (Deleted)
   There were no vitals taken for this visit.   Subjective:    Patient ID: [name] Amy., male    DOB: 1960/11/13, 61 y.o.   MRN: [id number]  HPI: [name] Winkels. is a 60 y.o. male  No chief complaint on file.  HYPERTENSION / HYPERLIPIDEMIA Satisfied with current treatment? {Blank single:19197::"yes","no"} Duration of hypertension: {Blank single:19197::"chronic","months","years"} BP monitoring frequency: {Blank single:19197::"not checking","rarely","daily","weekly","monthly","a few times a day","a few times a week","a few times a month"} BP range:  BP medication side effects: {Blank single:19197::"yes","no"} Past BP meds: {Blank OEHOZYYQ:82500::"BBCW","UGQBVQXIHW","TUUEKCMKLK/JZPHXTAVWP","VXYIAXKP","VVZSMOLMBE","MLJQGBEEFE/OFHQ","RFXJOITGPQ (bystolic)","carvedilol","chlorthalidone","clonidine","diltiazem","exforge HCT","HCTZ","irbesartan (avapro)","labetalol","lisinopril","lisinopril-HCTZ","losartan (cozaar)","methyldopa","nifedipine","olmesartan (benicar)","olmesartan-HCTZ","quinapril","ramipril","spironalactone","tekturna","valsartan","valsartan-HCTZ","verapamil"} Duration of hyperlipidemia: {Blank single:19197::"chronic","months","years"} Cholesterol medication side effects: {Blank single:19197::"yes","no"} Cholesterol supplements: {Blank multiple:19196::"none","fish oil","niacin","red yeast rice"} Past cholesterol medications: {Blank multiple:19196::"none","atorvastain (lipitor)","lovastatin (mevacor)","pravastatin (pravachol)","rosuvastatin (crestor)","simvastatin (zocor)","vytorin","fenofibrate (tricor)","gemfibrozil","ezetimide (zetia)","niaspan","lovaza"} Medication compliance: {Blank single:19197::"excellent compliance","good compliance","fair compliance","poor compliance"} Aspirin: {Blank single:19197::"yes","no"} Recent stressors: {Blank single:19197::"yes","no"} Recurrent headaches: {Blank single:19197::"yes","no"} Visual changes: {Blank  single:19197::"yes","no"} Palpitations: {Blank single:19197::"yes","no"} Dyspnea: {Blank single:19197::"yes","no"} Chest pain: {Blank single:19197::"yes","no"} Lower extremity edema: {Blank single:19197::"yes","no"} Dizzy/lightheaded: {Blank single:19197::"yes","no"}  DIABETES Hypoglycemic episodes:{Blank single:19197::"yes","no"} Polydipsia/polyuria: {Blank single:19197::"yes","no"} Visual disturbance: {Blank single:19197::"yes","no"} Chest pain: {Blank single:19197::"yes","no"} Paresthesias: {Blank single:19197::"yes","no"} Glucose Monitoring: {Blank single:19197::"yes","no"}  Accucheck frequency: {Blank single:19197::"Not Checking","Daily","BID","TID"}  Fasting glucose:  Post prandial:  Evening:  Before meals: Taking Insulin?: {Blank single:19197::"yes","no"}  Long acting insulin:  Short acting insulin: Blood Pressure Monitoring: {Blank single:19197::"not checking","rarely","daily","weekly","monthly","a few times a day","a few times a week","a few times a month"} Retinal Examination: {Blank single:19197::"Up to Date","Not up to Date"} Foot Exam: {Blank single:19197::"Up to Date","Not up to Date"} Diabetic Education: {Blank single:19197::"Completed","Not Completed"} Pneumovax: {Blank single:19197::"Up to Date","Not up to Date","unknown"} Influenza: {Blank single:19197::"Up to Date","Not up to Date","unknown"} Aspirin: {Blank single:19197::"yes","no"}   Relevant past medical, surgical, family and social history reviewed and updated as indicated. Interim medical history since our last visit reviewed. Allergies and medications reviewed and updated.  Review of Systems  Per HPI unless specifically indicated above     Objective:    There were no vitals taken for this visit.  Wt Readings from Last 3 Encounters:  08/25/20 265 lb (120.2 kg)  08/20/20 268 lb (121.6 kg)  03/11/20 297 lb 3.2 oz (134.8 kg)    Physical Exam  Results for orders placed or performed in visit on  08/28/20  [facility] SCREENING LAB  Result Value Ref Range   HM HIV Screening Negative - Validated   [facility] HEPATITIS C SCREENING LAB  Result Value Ref Range   HM Hepatitis Screen Negative-Validated       Assessment & Plan:   Problem List Items Addressed This Visit   None    Follow up plan: No follow-ups on file.

## 2020-10-19 ENCOUNTER — Other Ambulatory Visit: Payer: Self-pay

## 2020-10-19 ENCOUNTER — Encounter: Payer: Self-pay | Admitting: Internal Medicine

## 2020-10-19 ENCOUNTER — Ambulatory Visit: Payer: Self-pay | Admitting: Internal Medicine

## 2020-10-19 ENCOUNTER — Ambulatory Visit: Payer: Self-pay | Admitting: *Deleted

## 2020-10-19 ENCOUNTER — Ambulatory Visit: Payer: Medicaid Other | Admitting: Internal Medicine

## 2020-10-19 VITALS — BP 137/88 | HR 120 | Temp 98.4°F | Ht 72.01 in | Wt 262.6 lb

## 2020-10-19 DIAGNOSIS — T148XXA Other injury of unspecified body region, initial encounter: Secondary | ICD-10-CM

## 2020-10-19 DIAGNOSIS — Z23 Encounter for immunization: Secondary | ICD-10-CM | POA: Diagnosis not present

## 2020-10-19 DIAGNOSIS — L089 Local infection of the skin and subcutaneous tissue, unspecified: Secondary | ICD-10-CM

## 2020-10-19 LAB — BAYER DCA HB A1C WAIVED: HB A1C (BAYER DCA - WAIVED): 10.9 % — ABNORMAL HIGH (ref <7.0)

## 2020-10-19 MED ORDER — SULFAMETHOXAZOLE-TRIMETHOPRIM 800-160 MG PO TABS: 1.0000 | ORAL_TABLET | Freq: Two times a day (BID) | ORAL | 0 refills | Status: DC

## 2020-10-19 NOTE — Telephone Encounter (Signed)
Patient is reporting R foot- started as sore on foot- now has bleeding from ball of foot- discoloration in foot, swelling, pain. Patient is diabetic. Appointment scheduled. Reason for Disposition  [1] Wound > 39 hours old AND [2] it becomes more tender  Answer Assessment - Initial Assessment Questions 1. MECHANISM: "How did the injury happen?" (e.g., twisting injury, direct blow)      Not sure- patient thought he had planter wart on sole of foot 2. ONSET: "When did the injury happen?" (Minutes or hours ago)       For weeks- bleeding started last night 3. LOCATION: "Where is the injury located?"      R foot- bottom 4. APPEARANCE of INJURY: "What does the injury look like?"      Discoloration of foot 5. WEIGHT-BEARING: "Can you put weight on that foot?" "Can you walk (four steps or more)?"       limping 6. SIZE: For cuts, bruises, or swelling, ask: "How large is it?" (e.g., inches or centimeters;  entire joint)      Swelling- top of foot, toes 7. PAIN: "Is there pain?" If Yes, ask: "How bad is the pain?"    (e.g., Scale 1-10; or mild, moderate, severe)   - NONE (0): no pain.   - MILD (1-3): doesn't interfere with normal activities.    - MODERATE (4-7): interferes with normal activities (e.g., work or school) or awakens from sleep, limping.    - SEVERE (8-10): excruciating pain, unable to do any normal activities, unable to walk.      moderate 8. TETANUS: For any breaks in the skin, ask: "When was the last tetanus booster?"     Yes- he thinks he is 9. OTHER SYMPTOMS: "Do you have any other symptoms?"      Foot neuropathy worse 10. PREGNANCY: "Is there any chance you are pregnant?" "When was your last menstrual period?"       N/a  Protocols used: Ankle and Foot Injury-A-AH, Wound Infection-A-AH

## 2020-10-19 NOTE — Progress Notes (Signed)
BP 137/88   Pulse (!) 120   Temp 98.4 F (36.9 C) (Oral)   Ht 6' 0.01" (1.829 m)   Wt 262 lb 9.6 oz (119.1 kg)   SpO2 97%   BMI 35.61 kg/m    Subjective:    Patient ID: [name] Amy., male    DOB: May 16, 1960, 60 y.o.   MRN: [id number]  Chief Complaint  Patient presents with   Right foot pain    Spot on bottom of foot bleeding, noticed it yesterday.     HPI: [name] Launer. is a 60 y.o. male  Pt is here for a wound in the sole of the right foot. Pt is Diabetic. Noticed x yesterday when it wa sbleeding. Saturday was hurting. Though he had a plantar wart Left BKA in the past.    Chief Complaint  Patient presents with   Right foot pain    Spot on bottom of foot bleeding, noticed it yesterday.     Relevant past medical, surgical, family and social history reviewed and updated as indicated. Interim medical history since our last visit reviewed. Allergies and medications reviewed and updated.  Review of Systems  Per HPI unless specifically indicated above     Objective:    BP 137/88   Pulse (!) 120   Temp 98.4 F (36.9 C) (Oral)   Ht 6' 0.01" (1.829 m)   Wt 262 lb 9.6 oz (119.1 kg)   SpO2 97%   BMI 35.61 kg/m   Wt Readings from Last 3 Encounters:  10/19/20 262 lb 9.6 oz (119.1 kg)  08/25/20 265 lb (120.2 kg)  08/20/20 268 lb (121.6 kg)    Physical Exam Skin:    Comments: Circumscribed well-circumscribed area noted on the sole of  Right foot pain: Dark in color bleeding, noticed it yesterday.       Results for orders placed or performed in visit on 08/28/20  [facility] SCREENING LAB  Result Value Ref Range   HM HIV Screening Negative - Validated   [facility] HEPATITIS C SCREENING LAB  Result Value Ref Range   HM Hepatitis Screen Negative-Validated         Current Outpatient Medications:    acetaminophen (TYLENOL) 500 MG tablet, Take 500 mg by mouth every 6 (six) hours as needed., Disp: , Rfl:    albuterol (VENTOLIN HFA) 108 (90 Base)  MCG/ACT inhaler, Inhale 2 puffs into the lungs every 4 (four) hours as needed., Disp: 18 g, Rfl: 0   clindamycin (CLEOCIN) 300 MG capsule, Take 300 mg by mouth 3 (three) times daily., Disp: , Rfl:    Dulaglutide (TRULICITY) 6.28 ZM/6.2HU SOPN, Inject 0.75 mg into the skin once a week., Disp: 6 mL, Rfl: 4   empagliflozin (JARDIANCE) 10 MG TABS tablet, Take 1 tablet (10 mg total) by mouth daily before breakfast., Disp: 90 tablet, Rfl: 4   gabapentin (NEURONTIN) 600 MG tablet, 1200 mg  BID, Disp: 120 tablet, Rfl: 2   Spacer/Aero-Holding Chambers (AEROCHAMBER MV) inhaler, Use as instructed, Disp: 1 each, Rfl: 2   gentamicin cream (GARAMYCIN) 0.1 %, Apply 1 application topically 3 (three) times daily. (Patient not taking: No sig reported), Disp: 15 g, Rfl: 1   metFORMIN (GLUCOPHAGE) 500 MG tablet, Take 1 tablet (500 mg total) by mouth 2 (two) times daily with a meal., Disp: 60 tablet, Rfl: 1    Assessment & Plan:  Diabetic foot ulcer/ gangrene NEED STAT XRAY  : start pt on bactrim,  wound  dressed per nursing see nursing notes  TDAP administered  Will refer to wound clinic and podiatry  Pt is fairly diabetic with an A1c of 12.8. Patient advised to monitor blood sugars and call PCP if gets worse. Will need to follow-up x 1 week for fu and further mx.  Concern for gangrene.   Problem List Items Addressed This Visit   None    No orders of the defined types were placed in this encounter.    No orders of the defined types were placed in this encounter.    Follow up plan: Return in about 1 week (around 10/26/2020).

## 2020-10-20 ENCOUNTER — Emergency Department: Payer: Medicaid Other

## 2020-10-20 ENCOUNTER — Telehealth: Payer: Self-pay

## 2020-10-20 ENCOUNTER — Observation Stay
Admission: EM | Admit: 2020-10-20 | Discharge: 2020-10-21 | Disposition: A | Discharge status: Home / Self Care | Payer: Medicaid Other | Attending: Internal Medicine | Admitting: Internal Medicine

## 2020-10-20 ENCOUNTER — Encounter: Payer: Self-pay | Admitting: Medical Oncology

## 2020-10-20 ENCOUNTER — Encounter: Payer: Self-pay | Admitting: Internal Medicine

## 2020-10-20 DIAGNOSIS — Z720 Tobacco use: Secondary | ICD-10-CM

## 2020-10-20 DIAGNOSIS — E1159 Type 2 diabetes mellitus with other circulatory complications: Secondary | ICD-10-CM

## 2020-10-20 DIAGNOSIS — E11621 Type 2 diabetes mellitus with foot ulcer: Secondary | ICD-10-CM

## 2020-10-20 DIAGNOSIS — I10 Essential (primary) hypertension: Secondary | ICD-10-CM | POA: Diagnosis not present

## 2020-10-20 DIAGNOSIS — Z79891 Long term (current) use of opiate analgesic: Secondary | ICD-10-CM | POA: Diagnosis not present

## 2020-10-20 DIAGNOSIS — L97512 Non-pressure chronic ulcer of other part of right foot with fat layer exposed: Secondary | ICD-10-CM | POA: Diagnosis not present

## 2020-10-20 DIAGNOSIS — Z79899 Other long term (current) drug therapy: Secondary | ICD-10-CM | POA: Diagnosis not present

## 2020-10-20 DIAGNOSIS — M609 Myositis, unspecified: Secondary | ICD-10-CM | POA: Diagnosis not present

## 2020-10-20 DIAGNOSIS — T148XXA Other injury of unspecified body region, initial encounter: Secondary | ICD-10-CM | POA: Diagnosis not present

## 2020-10-20 DIAGNOSIS — L97501 Non-pressure chronic ulcer of other part of unspecified foot limited to breakdown of skin: Secondary | ICD-10-CM | POA: Diagnosis not present

## 2020-10-20 DIAGNOSIS — L089 Local infection of the skin and subcutaneous tissue, unspecified: Secondary | ICD-10-CM | POA: Diagnosis not present

## 2020-10-20 DIAGNOSIS — L97412 Non-pressure chronic ulcer of right heel and midfoot with fat layer exposed: Secondary | ICD-10-CM | POA: Insufficient documentation

## 2020-10-20 DIAGNOSIS — Z23 Encounter for immunization: Secondary | ICD-10-CM

## 2020-10-20 DIAGNOSIS — Z7984 Long term (current) use of oral hypoglycemic drugs: Secondary | ICD-10-CM | POA: Diagnosis not present

## 2020-10-20 DIAGNOSIS — L0390 Cellulitis, unspecified: Secondary | ICD-10-CM | POA: Diagnosis not present

## 2020-10-20 DIAGNOSIS — I152 Hypertension secondary to endocrine disorders: Secondary | ICD-10-CM

## 2020-10-20 DIAGNOSIS — A419 Sepsis, unspecified organism: Principal | ICD-10-CM | POA: Insufficient documentation

## 2020-10-20 DIAGNOSIS — Z20822 Contact with and (suspected) exposure to covid-19: Secondary | ICD-10-CM | POA: Insufficient documentation

## 2020-10-20 DIAGNOSIS — S91301A Unspecified open wound, right foot, initial encounter: Secondary | ICD-10-CM | POA: Diagnosis not present

## 2020-10-20 DIAGNOSIS — M7989 Other specified soft tissue disorders: Secondary | ICD-10-CM | POA: Diagnosis not present

## 2020-10-20 DIAGNOSIS — F17210 Nicotine dependence, cigarettes, uncomplicated: Secondary | ICD-10-CM | POA: Diagnosis not present

## 2020-10-20 DIAGNOSIS — L03115 Cellulitis of right lower limb: Secondary | ICD-10-CM | POA: Diagnosis not present

## 2020-10-20 DIAGNOSIS — Z89512 Acquired absence of left leg below knee: Secondary | ICD-10-CM

## 2020-10-20 DIAGNOSIS — L97509 Non-pressure chronic ulcer of other part of unspecified foot with unspecified severity: Secondary | ICD-10-CM

## 2020-10-20 LAB — CBC WITH DIFFERENTIAL/PLATELET
Basophils Absolute: 0.1 10*3/uL (ref 0.0–0.2)
Basos: 1 %
EOS (ABSOLUTE): 0.4 10*3/uL (ref 0.0–0.4)
Eos: 3 %
Hematocrit: 46.4 % (ref 37.5–51.0)
Hemoglobin: 16 g/dL (ref 13.0–17.7)
Immature Grans (Abs): 0.1 10*3/uL (ref 0.0–0.1)
Immature Granulocytes: 1 %
Lymphocytes Absolute: 4.2 10*3/uL — ABNORMAL HIGH (ref 0.7–3.1)
Lymphs: 28 %
MCH: 29.6 pg (ref 26.6–33.0)
MCHC: 34.5 g/dL (ref 31.5–35.7)
MCV: 86 fL (ref 79–97)
Monocytes Absolute: 0.7 10*3/uL (ref 0.1–0.9)
Monocytes: 5 %
Neutrophils Absolute: 9.5 10*3/uL — ABNORMAL HIGH (ref 1.4–7.0)
Neutrophils: 62 %
Platelets: 270 10*3/uL (ref 150–450)
RBC: 5.4 x10E6/uL (ref 4.14–5.80)
RDW: 13.5 % (ref 11.6–15.4)
WBC: 15 10*3/uL — ABNORMAL HIGH (ref 3.4–10.8)

## 2020-10-20 LAB — COMPREHENSIVE METABOLIC PANEL
ALT: 21 U/L (ref 0–44)
AST: 24 U/L (ref 15–41)
Albumin: 4.1 g/dL (ref 3.5–5.0)
Alkaline Phosphatase: 69 U/L (ref 38–126)
Anion gap: 8 (ref 5–15)
BUN: 11 mg/dL (ref 6–20)
CO2: 29 mmol/L (ref 22–32)
Calcium: 9.4 mg/dL (ref 8.9–10.3)
Chloride: 97 mmol/L — ABNORMAL LOW (ref 98–111)
Creatinine, Ser: 0.67 mg/dL (ref 0.61–1.24)
GFR, Estimated: >60 mL/min (ref >60)
Glucose, Bld: 320 mg/dL — ABNORMAL HIGH (ref 70–99)
Potassium: 3.8 mmol/L (ref 3.5–5.1)
Sodium: 134 mmol/L — ABNORMAL LOW (ref 135–145)
Total Bilirubin: 0.7 mg/dL (ref 0.3–1.2)
Total Protein: 7.7 g/dL (ref 6.5–8.1)

## 2020-10-20 LAB — CBC
HCT: 45.5 % (ref 39.0–52.0)
Hemoglobin: 15.7 g/dL (ref 13.0–17.0)
MCH: 29.7 pg (ref 26.0–34.0)
MCHC: 34.5 g/dL (ref 30.0–36.0)
MCV: 86.2 fL (ref 80.0–100.0)
Platelets: 248 10*3/uL (ref 150–400)
RBC: 5.28 MIL/uL (ref 4.22–5.81)
RDW: 13.3 % (ref 11.5–15.5)
WBC: 13.5 10*3/uL — ABNORMAL HIGH (ref 4.0–10.5)
nRBC: 0 % (ref 0.0–0.2)

## 2020-10-20 LAB — CBG MONITORING, ED: Glucose-Capillary: 277 mg/dL — ABNORMAL HIGH (ref 70–99)

## 2020-10-20 LAB — RESP PANEL BY RT-PCR (FLU A&B, COVID) ARPGX2
Influenza A by PCR: NEGATIVE
Influenza B by PCR: NEGATIVE
SARS Coronavirus 2 by RT PCR: NEGATIVE

## 2020-10-20 LAB — C-REACTIVE PROTEIN: CRP: 1.5 mg/dL — ABNORMAL HIGH (ref <1.0)

## 2020-10-20 LAB — HEMOGLOBIN A1C
Hgb A1c MFr Bld: 10.9 % — ABNORMAL HIGH (ref 4.8–5.6)
Mean Plasma Glucose: 266.13 mg/dL

## 2020-10-20 LAB — GLUCOSE, CAPILLARY: Glucose-Capillary: 214 mg/dL — ABNORMAL HIGH (ref 70–99)

## 2020-10-20 LAB — LACTIC ACID, PLASMA: Lactic Acid, Venous: 1.7 mmol/L (ref 0.5–1.9)

## 2020-10-20 LAB — SEDIMENTATION RATE: Sed Rate: 41 mm/hr — ABNORMAL HIGH (ref 0–20)

## 2020-10-20 MED ORDER — SODIUM CHLORIDE 0.9 % IV SOLN
2.0000 g | INTRAVENOUS | Status: DC
  Filled 2020-10-20: qty 20

## 2020-10-20 MED ORDER — VANCOMYCIN HCL 2000 MG/400ML IV SOLN
2000.0000 mg | Freq: Once | INTRAVENOUS | Status: AC
  Administered 2020-10-20: 2000 mg via INTRAVENOUS
  Filled 2020-10-20: qty 400

## 2020-10-20 MED ORDER — INSULIN ASPART 100 UNIT/ML IJ SOLN
0.0000 [IU] | INTRAMUSCULAR | Status: DC
  Administered 2020-10-20: 8 [IU] via SUBCUTANEOUS
  Administered 2020-10-20: 5 [IU] via SUBCUTANEOUS
  Administered 2020-10-21: 05:00:00 3 [IU] via SUBCUTANEOUS
  Administered 2020-10-21: 02:00:00 5 [IU] via SUBCUTANEOUS
  Administered 2020-10-21 (×2): 3 [IU] via SUBCUTANEOUS
  Filled 2020-10-20 (×5): qty 1

## 2020-10-20 MED ORDER — VANCOMYCIN HCL 1500 MG/300ML IV SOLN: 1500.0000 mg | Freq: Two times a day (BID) | INTRAVENOUS | Status: DC

## 2020-10-20 MED ORDER — GABAPENTIN 600 MG PO TABS
1200.0000 mg | ORAL_TABLET | Freq: Two times a day (BID) | ORAL | Status: DC
  Administered 2020-10-20 – 2020-10-21 (×2): 1200 mg via ORAL
  Filled 2020-10-20 (×2): qty 2

## 2020-10-20 MED ORDER — ACETAMINOPHEN 500 MG PO TABS
500.0000 mg | ORAL_TABLET | Freq: Four times a day (QID) | ORAL | Status: DC | PRN
  Administered 2020-10-20: 500 mg via ORAL
  Filled 2020-10-20: qty 1

## 2020-10-20 MED ORDER — NALOXONE HCL 4 MG/0.1ML NA LIQD: NASAL | 0 refills | Status: DC

## 2020-10-20 MED ORDER — VANCOMYCIN HCL 1500 MG/300ML IV SOLN
1500.0000 mg | Freq: Two times a day (BID) | INTRAVENOUS | Status: DC
  Administered 2020-10-21: 1500 mg via INTRAVENOUS
  Filled 2020-10-20 (×2): qty 300

## 2020-10-20 MED ORDER — PIPERACILLIN-TAZOBACTAM 3.375 G IVPB 30 MIN
3.3750 g | Freq: Once | INTRAVENOUS | Status: AC
  Administered 2020-10-20: 3.375 g via INTRAVENOUS
  Filled 2020-10-20: qty 50

## 2020-10-20 NOTE — ED Notes (Signed)
MD Vickki Muff at bedside debriding wound. MD dressed wound with guaze.

## 2020-10-20 NOTE — ED Triage Notes (Signed)
Pt reports a callus that hes had on his rt foot now has become black. Pt reports this has happened in the last 3-4 days.

## 2020-10-20 NOTE — Telephone Encounter (Signed)
Mailbox full unable to Lvm to make apt sooner this week per Dr.Vigg can see PCP or Dr Neomia Dear anyone who has available this week.

## 2020-10-20 NOTE — ED Provider Notes (Signed)
[facility] Emergency Department Provider Note  ____________________________________________   Event Date/Time   First MD Initiated Contact with Patient 10/20/20 1518     (approximate)  I have reviewed the triage vital signs and the nursing notes.   HISTORY  Chief Complaint diabetic foot ulcer    HPI [name] Day. is a 60 y.o. male  here with wound, scab to bottom of right foot. Pt reports he's had a spot/ulcer on it for the last several months. Over the last few days, he's had bleeding, opening of the wound along with increasing pain, swelling. Bleeding on and off since Sunday. Went to his PCP today who told him to come here. No fever, night sweats, chills.  Reports he has some mild pain in ankle as well. H/o left BKA so often has pressure wounds/ulcer on the right. He's noticed his neuropathy getting worse as well. Pain worse w/ palpation, walking. No alleviating factors.       Past Medical History:  Diagnosis Date  . Arthritis    hands  . Depression   . Diabetes mellitus without complication ([place])   . Dyspnea   . Hx of BKA ([place])    due to complicated fracture wears prosthesis  . Panic attack   . Wears dentures    upper and lower full plate    Patient Active Problem List   Diagnosis Date Noted  . Hypercholesteremia 08/25/2020  . Mass of joint of right shoulder 08/25/2020  . Type 2 diabetes mellitus with proteinuria ([place]) 09/06/2018  . Hypertension associated with diabetes ([place]) 08/03/2018  . Long term current use of opiate analgesic 12/12/2017  . Morbid obesity ([place]) 09/11/2017  . Nicotine dependence, cigarettes, w unsp disorders 09/11/2017  . Lumbar spondylosis 09/11/2017  . Chronic sacroiliac joint pain 09/11/2017  . Chronic bilateral low back pain with left-sided sciatica 09/11/2017  . S/P BKA (below knee amputation) unilateral, left ([place]) 03/29/2017  . Chronic pain syndrome 03/29/2017  . Phantom pain after amputation of  lower extremity ([place]) 03/29/2017  . Chronic left hip pain 03/04/2017  . 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 Pain in joint involving ankle and foot 11/09/2011  . Primary localized osteoarthrosis of ankle and foot 10/03/2011    Past Surgical History:  Procedure Laterality Date  . COLONOSCOPY WITH PROPOFOL N/A 09/27/2018   Procedure: COLONOSCOPY WITH BIOPSY;  Surgeon: Lucilla Lame, MD;  Location: [place];  Service: Endoscopy;  Laterality: N/A;  . HAND SURGERY    . LEG AMPUTATION    . LEG SURGERY    . POLYPECTOMY N/A 09/27/2018   Procedure: POLYPECTOMY;  Surgeon: Lucilla Lame, MD;  Location: [place];  Service: Endoscopy;  Laterality: N/A;    Prior to Admission medications   Medication Sig Start Date End Date Taking? Authorizing Provider  naloxone [facility]) nasal spray 4 mg/0.1 mL As needed for witnessed or suspected opioid overdose 10/20/20  Yes Duffy Bruce, MD  acetaminophen (TYLENOL) 500 MG tablet Take 500 mg by mouth every 6 (six) hours as needed.    [provider]  albuterol (VENTOLIN HFA) 108 (90 Base) MCG/ACT inhaler Inhale 2 puffs into the lungs every 4 (four) hours as needed. 08/20/20   Margarette Canada, NP  clindamycin (CLEOCIN) 300 MG capsule Take 300 mg by mouth 3 (three) times daily. 06/23/20   [provider]  Dulaglutide (TRULICITY) 4.56 YB/6.3SL SOPN Inject 0.75 mg into the skin once a week. 08/25/20   Jon Billings,  NP  empagliflozin (JARDIANCE) 10 MG TABS tablet Take 1 tablet (10 mg total) by mouth daily before breakfast. 03/12/20   Marnee Guarneri T, NP  gabapentin (NEURONTIN) 600 MG tablet 1200 mg  BID 08/25/20   Jon Billings, NP  gentamicin cream (GARAMYCIN) 0.1 % Apply 1 application topically 3 (three) times daily. Patient not taking: No sig reported 03/12/19   Edrick Kins, DPM  metFORMIN (GLUCOPHAGE) 500 MG tablet Take 1 tablet (500 mg total) by mouth 2 (two) times daily with a meal. 08/20/20 09/19/20  Margarette Canada, NP  Spacer/Aero-Holding Josiah Lobo (AEROCHAMBER MV) inhaler Use as instructed 08/20/20   Margarette Canada, NP  sulfamethoxazole-trimethoprim (BACTRIM DS) 800-160 MG tablet Take 1 tablet by mouth 2 (two) times daily for 7 days. 10/19/20 10/26/20  Charlynne Cousins, MD    Allergies Tramadol  Family History  Problem Relation Age of Onset  . Cancer Mother        Mastatic  . Diabetes Father   . Hypertension Father   . Heart disease Father   . Alzheimer's disease Maternal Grandmother   . Cancer Maternal Grandfather   . Cancer Paternal Grandmother     Social History Social History   Tobacco Use  . Smoking status: Every Day    Packs/day: 1.00    Years: 45.00    Pack years: 45.00    Types: Cigarettes  . Smokeless tobacco: Never  Vaping Use  . Vaping Use: Never used  Substance Use Topics  . Alcohol use: No  . Drug use: No    Review of Systems  Review of Systems  Constitutional:  Negative for chills, fatigue and fever.  HENT:  Negative for sore throat.   Respiratory:  Negative for shortness of breath.   Cardiovascular:  Negative for chest pain.  Gastrointestinal:  Negative for abdominal pain.  Genitourinary:  Negative for flank pain.  Musculoskeletal:  Positive for arthralgias and gait problem. Negative for neck pain.  Skin:  Positive for wound. Negative for rash.  Allergic/Immunologic: Negative for immunocompromised state.  Neurological:  Negative for weakness and numbness.  Hematological:  Does not bruise/bleed easily.  All other systems reviewed and are negative.   ____________________________________________  PHYSICAL EXAM:      VITAL SIGNS: ED Triage Vitals  Enc Vitals Group     BP 10/20/20 1449 125/73     Pulse Rate 10/20/20 1449 (!) 115     Resp 10/20/20 1449 18     Temp 10/20/20 1449 98.4 F (36.9 C)     Temp Source 10/20/20 1449 Oral     SpO2 --      Weight 10/20/20 1450 262 lb 5.6 oz (119 kg)     Height 10/20/20 1450 6' (1.829 m)     Head Circumference --       Peak Flow --      Pain Score 10/20/20 1450 5     Pain Loc --      Pain Edu? --      Excl. in [place]? --      Physical Exam Vitals and nursing note reviewed.  Constitutional:      General: He is not in acute distress.    Appearance: He is well-developed.  HENT:     Head: Normocephalic and atraumatic.  Eyes:     Conjunctiva/sclera: Conjunctivae normal.  Cardiovascular:     Rate and Rhythm: Regular rhythm. Tachycardia present.     Heart sounds: Normal heart sounds. No murmur heard.   No friction rub.  Pulmonary:     Effort: Pulmonary effort is normal. No respiratory distress.     Breath sounds: Normal breath sounds. No wheezing or rales.  Abdominal:     General: There is no distension.     Palpations: Abdomen is soft.     Tenderness: There is no abdominal tenderness.  Musculoskeletal:     Cervical back: Neck supple.  Skin:    General: Skin is warm.     Capillary Refill: Capillary refill takes less than 2 seconds.  Neurological:     Mental Status: He is alert and oriented to person, place, and time.     Motor: No abnormal muscle tone.     LOWER EXTREMITY EXAM: Right  INSPECTION & PALPATION: Dry, black eschar overlying ulcer to the plantar aspect of the forefoot overlying the second through fourth MTP joints.  There is surrounding mild erythema and tenderness to palpation.  No expressible purulence noted  SENSORY: sensation is intact to light touch in:  Superficial peroneal nerve distribution (over dorsum of foot) Deep peroneal nerve distribution (over first dorsal web space) Sural nerve distribution (over lateral aspect 5th metatarsal) Saphenous nerve distribution (over medial instep)  MOTOR:  + Motor EHL (great toe dorsiflexion) + FHL (great toe plantar flexion)  + TA (ankle dorsiflexion)  + GSC (ankle plantar flexion)  VASCULAR: 1+ dorsalis pedis and posterior tibialis pulses Capillary refill < 2 sec, toes warm and  well-perfused    ____________________________________________   LABS (all labs ordered are listed, but only abnormal results are displayed)  Labs Reviewed  CBC - Abnormal; Notable for the following components:      Result Value   WBC 13.5 (*)    All other components within normal limits  COMPREHENSIVE METABOLIC PANEL - Abnormal; Notable for the following components:   Sodium 134 (*)    Chloride 97 (*)    Glucose, Bld 320 (*)    All other components within normal limits  RESP PANEL BY RT-PCR (FLU A&B, COVID) ARPGX2  CULTURE, BLOOD (ROUTINE X 2)  CULTURE, BLOOD (ROUTINE X 2)  LACTIC ACID, PLASMA  SEDIMENTATION RATE  C-REACTIVE PROTEIN    ____________________________________________  EKG:  ________________________________________  RADIOLOGY All imaging, including plain films, CT scans, and ultrasounds, independently reviewed by me, and interpretations confirmed via formal radiology reads.  ED MD interpretation:   XR Foot: Air tracking from skin ulceration, no obvious osteo  Official radiology report(s): No results found.  ____________________________________________  PROCEDURES   Procedure(s) performed (including Critical Care):  .1-3 Lead EKG Interpretation  Date/Time: 10/20/2020 4:21 PM Performed by: Duffy Bruce, MD Authorized by: Duffy Bruce, MD     Interpretation: abnormal     ECG rate:  100-120   ECG rate assessment: tachycardic     Rhythm: sinus tachycardia     Ectopy: none     Conduction: normal   Comments:     Indication: Sepsis .Critical Care  Date/Time: 10/20/2020 4:21 PM Performed by: Duffy Bruce, MD Authorized by: Duffy Bruce, MD   Critical care provider statement:    Critical care time (minutes):  35   Critical care time was exclusive of:  Separately billable procedures and treating other patients and teaching time   Critical care was necessary to treat or prevent imminent or life-threatening deterioration of the following  conditions:  Cardiac failure, circulatory failure and sepsis   Critical care was time spent personally by me on the following activities:  Development of treatment plan with patient or surrogate, discussions with  consultants, evaluation of patient's response to treatment, examination of patient, obtaining history from patient or surrogate, ordering and performing treatments and interventions, ordering and review of laboratory studies, ordering and review of radiographic studies, pulse oximetry, re-evaluation of patient's condition and review of old charts   I assumed direction of critical care for this patient from another provider in my specialty: no    ____________________________________________  INITIAL IMPRESSION / MDM / [place] / ED COURSE  As part of my medical decision making, I reviewed the following data within the [place] notes reviewed and incorporated, Old chart reviewed, Notes from prior ED visits, and [place] [employer]. was evaluated in Emergency Department on 10/20/2020 for the symptoms described in the history of present illness. He was evaluated in the context of the global COVID-19 pandemic, which necessitated consideration that the patient might be at risk for infection with the SARS-CoV-2 virus that causes COVID-19. Institutional protocols and algorithms that pertain to the evaluation of patients at risk for COVID-19 are in a state of rapid change based on information released by regulatory bodies including the CDC and federal and state organizations. These policies and algorithms were followed during the patient's care in the ED.  Some ED evaluations and interventions may be delayed as a result of limited staffing during the pandemic.*     Medical Decision Making: 60 year old gentleman with history of poorly controlled type 2 diabetes, last A1c greater than 10, here with foot ulceration.   Concern for diabetic foot wound with evidence of dry gangrene.  Significant increase in tenderness and drainage with bleeding raises concern for superimposed acute osteomyelitis or diabetic cellulitis.  Patient does have moderate leukocytosis and tachycardia.  Lactic acid is normal.  Plain film reviewed by me, shows no overt evidence of osteomyelitis.  Discussed case with Dr. Vickki Muff of podiatry.  Will obtain MRI, admit for antibiotics and likely need for debridement.  COVID sent.  ____________________________________________  FINAL CLINICAL IMPRESSION(S) / ED DIAGNOSES  Final diagnoses:  Diabetic ulcer of right midfoot associated with type 2 diabetes mellitus, with fat layer exposed ([place])  Sepsis due to cellulitis ([place])     MEDICATIONS GIVEN DURING THIS VISIT:  Medications  piperacillin-tazobactam (ZOSYN) IVPB 3.375 g (has no administration in time range)  vancomycin (VANCOREADY) IVPB 2000 mg/400 mL (has no administration in time range)     ED Discharge Orders          Ordered    naloxone (NARCAN) nasal spray 4 mg/0.1 mL        10/20/20 1604             Note:  This document was prepared using Dragon voice recognition software and may include unintentional dictation errors.   Duffy Bruce, MD 10/20/20 [phone]

## 2020-10-20 NOTE — Consult Note (Signed)
ORTHOPAEDIC CONSULTATION  REQUESTING PHYSICIAN: Orene Desanctis, DO  Chief Complaint: Necrotic ulcer right foot  HPI: Stepehn Eckard. is a 60 y.o. male who complains of wound to his right foot.  He noticed some darkened discoloration to the area.  He does have a history of diabetes with neuropathy.  He status post left-sided BKA secondary to trauma and infection.  He states the wound has been developing over the last few weeks.  It off as a callus and progressively got worse.  He has noticed little bit of swelling and redness to the area.  Past Medical History:  Diagnosis Date   Arthritis    hands   Depression    Diabetes mellitus without complication (HCC)    Dyspnea    Hx of BKA ([place])    due to complicated fracture wears prosthesis   Panic attack    Wears dentures    upper and lower full plate   Past Surgical History:  Procedure Laterality Date   COLONOSCOPY WITH PROPOFOL N/A 09/27/2018   Procedure: COLONOSCOPY WITH BIOPSY;  Surgeon: Lucilla Lame, MD;  Location: [place];  Service: Endoscopy;  Laterality: N/A;   HAND SURGERY     LEG AMPUTATION     LEG SURGERY     POLYPECTOMY N/A 09/27/2018   Procedure: POLYPECTOMY;  Surgeon: Lucilla Lame, MD;  Location: [place];  Service: Endoscopy;  Laterality: N/A;   Social History   Socioeconomic History   Marital status: Married    Spouse name: Not on file   Number of children: Not on file   Years of education: Not on file   Highest education level: Not on file  Occupational History   Not on file  Tobacco Use   Smoking status: Every Day    Packs/day: 1.00    Years: 45.00    Pack years: 45.00    Types: Cigarettes   Smokeless tobacco: Never  Vaping Use   Vaping Use: Never used  Substance and Sexual Activity   Alcohol use: No   Drug use: No   Sexual activity: Yes    Partners: Female  Other Topics Concern   Not on file  Social History Narrative   Not on file   Social Determinants of Health    Financial Resource Strain: Not on file  Food Insecurity: Not on file  Transportation Needs: Not on file  Physical Activity: Not on file  Stress: Not on file  Social Connections: Not on file   Family History  Problem Relation Age of Onset   Cancer Mother        Mastatic   Diabetes Father    Hypertension Father    Heart disease Father    Alzheimer's disease Maternal Grandmother    Cancer Maternal Grandfather    Cancer Paternal Grandmother    Allergies  Allergen Reactions   Tramadol Diarrhea    Severe Headaches   Prior to Admission medications   Medication Sig Start Date End Date Taking? Authorizing Provider  acetaminophen (TYLENOL) 500 MG tablet Take 500 mg by mouth every 6 (six) hours as needed.   Yes [provider]  albuterol (VENTOLIN HFA) 108 (90 Base) MCG/ACT inhaler Inhale 2 puffs into the lungs every 4 (four) hours as needed. 08/20/20  Yes Margarette Canada, NP  Dulaglutide (TRULICITY) 0.86 VH/8.4ON SOPN Inject 0.75 mg into the skin once a week. Patient taking differently: Inject 0.75 mg into the skin every Thursday. 08/25/20  Yes Jon Billings, NP  metFORMIN (GLUCOPHAGE) 500 MG tablet Take 1 tablet (500 mg total) by mouth 2 (two) times daily with a meal. 08/20/20 10/20/20 Yes Margarette Canada, NP  naloxone [facility]) nasal spray 4 mg/0.1 mL As needed for witnessed or suspected opioid overdose 10/20/20  Yes Duffy Bruce, MD  empagliflozin (JARDIANCE) 10 MG TABS tablet Take 1 tablet (10 mg total) by mouth daily before breakfast. Patient not taking: Reported on 10/20/2020 03/12/20   Marnee Guarneri T, NP  gabapentin (NEURONTIN) 600 MG tablet 1200 mg  BID 08/25/20   Jon Billings, NP  gentamicin cream (GARAMYCIN) 0.1 % Apply 1 application topically 3 (three) times daily. Patient not taking: No sig reported 03/12/19   Edrick Kins, DPM  Spacer/Aero-Holding Chambers (AEROCHAMBER MV) inhaler Use as instructed 08/20/20   Margarette Canada, NP  sulfamethoxazole-trimethoprim  (BACTRIM DS) 800-160 MG tablet Take 1 tablet by mouth 2 (two) times daily for 7 days. Patient not taking: Reported on 10/20/2020 10/19/20 10/26/20  Charlynne Cousins, MD   MR FOOT RIGHT WO CONTRAST  Result Date: 10/20/2020 CLINICAL DATA:  Open wound on the plantar aspect of the foot. Assess for osteomyelitis or abscess. EXAM: MRI OF THE RIGHT FOREFOOT WITHOUT CONTRAST TECHNIQUE: Multiplanar, multisequence MR imaging of the right foot was performed. No intravenous contrast was administered. COMPARISON:  Radiographs, same date FINDINGS: There is an open wound noted on the plantar aspect of the forefoot located at the level of the second and third metatarsal heads. Diffuse subcutaneous soft tissue swelling/edema/fluid suggesting cellulitis. No discrete fluid collection to suggest an abscess. Moderate diffuse myositis without findings suspicious for pyomyositis. The joint spaces are maintained. No findings suspicious for septic arthritis. The bony structures are intact. No findings suspicious for osteomyelitis. The major tendons and ligaments are grossly intact. The visualized mid and hindfoot bony structures are intact. IMPRESSION: 1. Open wound on the plantar aspect of the forefoot at the level of the second and third metatarsal heads. 2. Diffuse cellulitis and myositis but no discrete fluid collection to suggest an abscess or pyomyositis. 3. No findings for septic arthritis or osteomyelitis. Electronically Signed   By: Marijo Sanes M.D.   On: 10/20/2020 16:55   DG Foot Complete Right  Result Date: 10/20/2020 CLINICAL DATA:  Diabetic patient with a skin ulceration on the right foot. EXAM: RIGHT FOOT COMPLETE - 3+ VIEW COMPARISON:  None. FINDINGS: Skin ulceration is seen on the plantar surface of the foot. No underlying radiopaque foreign body is identified. No gas tracking within soft tissues is identified. No bony destructive change or periosteal reaction. No fracture or dislocation. IMPRESSION: Skin ulceration on  the plantar surface of the foot. The exam is otherwise negative. Electronically Signed   By: Inge Rise M.D.   On: 10/20/2020 16:23    Positive ROS: All other systems have been reviewed and were otherwise negative with the exception of those mentioned in the HPI and as above.  12 point ROS was performed.  Physical Exam: General: Alert and oriented.  No apparent distress.  Vascular:  Left foot: Status post BKA  Right foot: Dorsalis Pedis:  present Posterior Tibial:  present  Neuro:absent protective sensation.  He did have some gross sensation with deep pressure to the plantar aspect of the right foot  Derm: There is full-thickness ulcer to the plantar aspect of the right forefoot.  Initially this was covered with dark hemorrhagic hyperkeratotic tissue but upon debridement there is a 5 x 7 ulceration through the epidermis and dermal layer but no deep  subcutaneous tissue.  No purulence.  No signs of severe infection at this time.  Ortho/MS: Status post left-sided BKA.  Right foot is stable.  No instability.  No edema.  Assessment: Diabetic neuropathic ulceration plantar right foot Ulcer to level of subcu  Plan: Debridement of the ulceration was performed today to the plantar aspect of the right forefoot area.  A 15 blade was used to remove all of the nonviable tissue down to and including the level of subcutaneous tissue.  Debridement was covered with hyperkeratosis with postdebridement at 5 x 7 cm.  No signs of active infection at this time.  MRI and x-ray are both negative.  I would recommend outpatient follow-up with podiatry.  Patient should cleanse wound daily and apply antibiotic ointment and a bandage.  Try to minimize ambulation to this forefoot over the next few weeks.  No urgent surgical debridement needed at this time.    Elesa Hacker, DPM Cell [phone]   10/20/2020 5:45 PM

## 2020-10-20 NOTE — Telephone Encounter (Signed)
Please let patient know his WBC count is high and will need to take his abx  we will need to get repeat labs x tommorow and follow-up with his PCP in 2 to 3 days pl move appt up.he has an appt for next week - if feels worse pl let him know to go to the ER>  Also needs to get an x-ray of the foot I do not see it being done I had ordered it STAT yesterday at his visit. Thank you Dr.VIGG

## 2020-10-20 NOTE — Progress Notes (Signed)
PHARMACY -  BRIEF ANTIBIOTIC NOTE   Pharmacy has received consult(s) for vancomycin and Zosyn from an ED provider.  The patient's profile has been reviewed for ht/wt/allergies/indication/available labs.    One time order(s) placed for vancomycin 2 g + Zosyn 3.375 g   Further antibiotics/pharmacy consults should be ordered by admitting physician if indicated.                       Thank you,  Tawnya Crook, PharmD 10/20/2020  4:09 PM

## 2020-10-20 NOTE — Progress Notes (Unsigned)
Per nursing:  "  Spoke with the wound clinic after they reviewed notes they want to have [place] send pt to the ER, for Stat Blood flow tests, see if he is ischemic, and might need IV abx "    Was called by staff after multiple attempts to reach him by myself this morning.  He is aware of the above and will go to the ER STAT.  Will need to follow-up after his hospitalization WITH his PCP who was also made aware of the above.  Shervin Cypert

## 2020-10-20 NOTE — ED Notes (Signed)
Amber RN aware of assigned bed 

## 2020-10-20 NOTE — Telephone Encounter (Signed)
Spoke  [employer] with the wound clinic after they reviewed you notes they want to have [place] send Patient to the ER, for Stat Blood flow tests, see if he is ischemic, and might need IV abx. That is what they are likely to tell him if he comes in to their office.

## 2020-10-20 NOTE — H&P (Addendum)
History and Physical    Safir [employer] QQV:[id number] DOB: 22-Nov-1960 DOA: 10/20/2020  PCP: Jon Billings, NP  Patient coming from: Home  I have personally briefly reviewed patient's old medical records in [place]  Chief Complaint: bleeding and redness around right foot wound  HPI: [name] Day. is a 60 y.o. 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  Patient reports having what he thought was a plantar wart at the bottom of his right foot 3 months ago.  However did not start to bother him until about 3 days ago where he felt more pressure and later noticed bleeding from the wound.  He then presented to his PCP yesterday and was prescribed Bactrim which he has not started.  They then later discussed with wound care who recommended he be promptly admitted to ED for further evaluation.  He denies any chills or fevers.  No nausea, vomiting or diarrhea.  ED Course: He was afebrile normotensive, mildly tachycardic, with leukocytosis of 13.5.  MRI of the foot was recommended by Dr. Vickki Muff of podiatry in did not show any osteomyelitis however there is diffuse cellulitis and myositis around the plantar wound.  Review of Systems: Constitutional: No Weight Change, No Fever ENT/Mouth: No sore throat, No Rhinorrhea Eyes: No Vision Changes Cardiovascular: No Chest Pain, no SOB Respiratory: No Cough, No Sputum, No Wheezing, no Dyspnea  Gastrointestinal: No Nausea, No Vomiting, No Diarrhea,  No Pain Genitourinary: no Urinary Incontinence Musculoskeletal: No Arthralgias, No Myalgias Skin: No Skin Lesions, No Pruritus, Neuro: no Weakness, +Numbness Psych: No Anxiety/Panic, No Depression, no decrease appetite Heme/Lymph: No Bruising, No  Bleeding  Past Medical History:  Diagnosis Date   Arthritis    hands   Depression    Diabetes mellitus without complication (HCC)    Dyspnea    Hx of BKA (HCC)    due to complicated fracture wears prosthesis   Panic attack    Wears dentures    upper and lower full plate    Past Surgical History:  Procedure Laterality Date   COLONOSCOPY WITH PROPOFOL N/A 09/27/2018   Procedure: COLONOSCOPY WITH BIOPSY;  Surgeon: Lucilla Lame, MD;  Location: [facility];  Service: Endoscopy;  Laterality: N/A;   HAND SURGERY     LEG AMPUTATION     LEG SURGERY     POLYPECTOMY N/A 09/27/2018   Procedure: POLYPECTOMY;  Surgeon: Lucilla Lame, MD;  Location: [facility];  Service: Endoscopy;  Laterality: N/A;     reports that he has been smoking cigarettes. He has a 45.00 pack-year smoking history. He has never used smokeless tobacco. He reports that he does not drink alcohol and does not use drugs. Social History  Allergies  Allergen Reactions   Tramadol Diarrhea    Severe Headaches    Family History  Problem Relation Age of Onset   Cancer Mother        Mastatic   Diabetes Father    Hypertension Father    Heart disease Father    Alzheimer's disease Maternal Grandmother    Cancer Maternal Grandfather    Cancer Paternal Grandmother      Prior to Admission medications   Medication Sig Start Date End Date Taking? Authorizing Provider  naloxone [facility]) nasal spray 4 mg/0.1 mL As needed for witnessed or suspected opioid  overdose 10/20/20  Yes Duffy Bruce, MD  acetaminophen (TYLENOL) 500 MG tablet Take 500 mg by mouth every 6 (six) hours as needed.    [provider]  albuterol (VENTOLIN HFA) 108 (90 Base) MCG/ACT inhaler Inhale 2 puffs into the lungs every 4 (four) hours as needed. 08/20/20   Margarette Canada, NP  clindamycin (CLEOCIN) 300 MG capsule Take 300 mg by mouth 3 (three) times daily. 06/23/20   [provider]  Dulaglutide (TRULICITY) 0.24 OX/7.3ZH  SOPN Inject 0.75 mg into the skin once a week. 08/25/20   Jon Billings, NP  empagliflozin (JARDIANCE) 10 MG TABS tablet Take 1 tablet (10 mg total) by mouth daily before breakfast. 03/12/20   Marnee Guarneri T, NP  gabapentin (NEURONTIN) 600 MG tablet 1200 mg  BID 08/25/20   Jon Billings, NP  gentamicin cream (GARAMYCIN) 0.1 % Apply 1 application topically 3 (three) times daily. Patient not taking: No sig reported 03/12/19   Edrick Kins, DPM  metFORMIN (GLUCOPHAGE) 500 MG tablet Take 1 tablet (500 mg total) by mouth 2 (two) times daily with a meal. 08/20/20 09/19/20  Margarette Canada, NP  Spacer/Aero-Holding Josiah Lobo (AEROCHAMBER MV) inhaler Use as instructed 08/20/20   Margarette Canada, NP  sulfamethoxazole-trimethoprim (BACTRIM DS) 800-160 MG tablet Take 1 tablet by mouth 2 (two) times daily for 7 days. 10/19/20 10/26/20  Charlynne Cousins, MD    Physical Exam: Vitals:   10/20/20 1449 10/20/20 1450  BP: 125/73   Pulse: (!) 115   Resp: 18   Temp: 98.4 F (36.9 C)   TempSrc: Oral   Weight:  119 kg  Height:  6' (1.829 m)    Constitutional: NAD, calm, comfortable, nontoxic appearing elderly gentleman laying at 20 degree incline in bed Vitals:   10/20/20 1449 10/20/20 1450  BP: 125/73   Pulse: (!) 115   Resp: 18   Temp: 98.4 F (36.9 C)   TempSrc: Oral   Weight:  119 kg  Height:  6' (1.829 m)   Eyes: PERRL, lids and conjunctivae normal ENMT: Mucous membranes are moist.  Neck: normal, supple Respiratory: clear to auscultation bilaterally, no wheezing, no crackles. Normal respiratory effort. No accessory muscle use.  Cardiovascular: Regular rate and rhythm, no murmurs / rubs / gallops.  Nonpitting edema of the right foot 2+ pedal pulses.  Abdomen: no tenderness, no masses palpated.  Bowel sounds positive.  Musculoskeletal: no clubbing / cyanosis.  Left BKA with prosthetic foot Skin: Large plantar ulcer with necrotic appearing eschar on MTP of third and fourth digit.   Neurologic: CN  2-12 grossly intact. Sensation intact, DTR normal. Strength 5/5 in all 4.  Psychiatric: Normal judgment and insight. Alert and oriented x 3. Normal mood.     Labs on Admission: I have personally reviewed following labs and imaging studies  CBC: Recent Labs  Lab 10/19/20 1607 10/20/20 1501  WBC 15.0* 13.5*  NEUTROABS 9.5*  --   HGB 16.0 15.7  HCT 46.4 45.5  MCV 86 86.2  PLT 270 299   Basic Metabolic Panel: Recent Labs  Lab 10/20/20 1501  NA 134*  K 3.8  CL 97*  CO2 29  GLUCOSE 320*  BUN 11  CREATININE 0.67  CALCIUM 9.4   GFR: Estimated Creatinine Clearance: 134.1 mL/min (by C-G formula based on SCr of 0.67 mg/dL). Liver Function Tests: Recent Labs  Lab 10/20/20 1501  AST 24  ALT 21  ALKPHOS 69  BILITOT 0.7  PROT 7.7  ALBUMIN 4.1   No results  for input(s): LIPASE, AMYLASE in the last 168 hours. No results for input(s): AMMONIA in the last 168 hours. Coagulation Profile: No results for input(s): INR, PROTIME in the last 168 hours. Cardiac Enzymes: No results for input(s): CKTOTAL, CKMB, CKMBINDEX, TROPONINI in the last 168 hours. BNP (last 3 results) No results for input(s): PROBNP in the last 8760 hours. HbA1C: Recent Labs    10/19/20 1603  HGBA1C 10.9*   CBG: Recent Labs  Lab 10/20/20 1730  GLUCAP 277*   Lipid Profile: No results for input(s): CHOL, HDL, LDLCALC, TRIG, CHOLHDL, LDLDIRECT in the last 72 hours. Thyroid Function Tests: No results for input(s): TSH, T4TOTAL, FREET4, T3FREE, THYROIDAB in the last 72 hours. Anemia Panel: No results for input(s): VITAMINB12, FOLATE, FERRITIN, TIBC, IRON, RETICCTPCT in the last 72 hours. Urine analysis:    Component Value Date/Time   COLORURINE YELLOW 08/20/2020 1210   APPEARANCEUR HAZY (A) 08/20/2020 1210   APPEARANCEUR Hazy (A) 12/05/2017 0912   LABSPEC 1.010 08/20/2020 1210   PHURINE 6.0 08/20/2020 1210   GLUCOSEU 500 (A) 08/20/2020 1210   HGBUR NEGATIVE 08/20/2020 1210   BILIRUBINUR NEGATIVE  08/20/2020 1210   BILIRUBINUR Negative 12/05/2017 0912   KETONESUR NEGATIVE 08/20/2020 1210   PROTEINUR NEGATIVE 08/20/2020 1210   NITRITE NEGATIVE 08/20/2020 1210   LEUKOCYTESUR NEGATIVE 08/20/2020 1210    Radiological Exams on Admission: MR FOOT RIGHT WO CONTRAST  Result Date: 10/20/2020 CLINICAL DATA:  Open wound on the plantar aspect of the foot. Assess for osteomyelitis or abscess. EXAM: MRI OF THE RIGHT FOREFOOT WITHOUT CONTRAST TECHNIQUE: Multiplanar, multisequence MR imaging of the right foot was performed. No intravenous contrast was administered. COMPARISON:  Radiographs, same date FINDINGS: There is an open wound noted on the plantar aspect of the forefoot located at the level of the second and third metatarsal heads. Diffuse subcutaneous soft tissue swelling/edema/fluid suggesting cellulitis. No discrete fluid collection to suggest an abscess. Moderate diffuse myositis without findings suspicious for pyomyositis. The joint spaces are maintained. No findings suspicious for septic arthritis. The bony structures are intact. No findings suspicious for osteomyelitis. The major tendons and ligaments are grossly intact. The visualized mid and hindfoot bony structures are intact. IMPRESSION: 1. Open wound on the plantar aspect of the forefoot at the level of the second and third metatarsal heads. 2. Diffuse cellulitis and myositis but no discrete fluid collection to suggest an abscess or pyomyositis. 3. No findings for septic arthritis or osteomyelitis. Electronically Signed   By: Marijo Sanes M.D.   On: 10/20/2020 16:55   DG Foot Complete Right  Result Date: 10/20/2020 CLINICAL DATA:  Diabetic patient with a skin ulceration on the right foot. EXAM: RIGHT FOOT COMPLETE - 3+ VIEW COMPARISON:  None. FINDINGS: Skin ulceration is seen on the plantar surface of the foot. No underlying radiopaque foreign body is identified. No gas tracking within soft tissues is identified. No bony destructive change  or periosteal reaction. No fracture or dislocation. IMPRESSION: Skin ulceration on the plantar surface of the foot. The exam is otherwise negative. Electronically Signed   By: Inge Rise M.D.   On: 10/20/2020 16:23      Assessment/Plan  Sepsis secondary to Right plantar foot wound cellulitis  -pt presented with tachycardia and leukocytosis  -no osteomyelitis seen on MRI -Podiatry Dr. Vickki Muff will follow in consultation.  Appreciate recommendations. -Continue IV vancomycin and will switch Zosyn to Rocephin  Uncontrolled type 2 diabetes w/neuopathy -Last hemoglobin A1c of 10.9. -Start sensitive SSI with q4hr checks  -  continue Gabapentin  HTN -controlled. Not on antihypertensives  S/P left BKA -due to trauma. Has prosthetic limb  Tobacco use -smokes half a pack per day -encouraged cessation  Morbid obesity - BMI of greater than 35  DVT prophylaxis:SCDs Code Status: Full Family Communication: Plan discussed with patient at bedside  disposition Plan: Home with observation Consults called: Podiatry Admission status: Observation  Level of care: Med-Surg  Status is: Observation  The patient remains OBS appropriate and will d/c before 2 midnights.  Dispo: The patient is from: Home              Anticipated d/c is to: Home              Patient currently is not medically stable to d/c.   Difficult to place patient No         Orene Desanctis DO [facility]   If 7PM-7AM, please contact night-coverage [email or web address]   10/20/2020, 5:33 PM

## 2020-10-20 NOTE — Progress Notes (Signed)
Washed patient wound on bottom of right foot with sterile water, applied antibiotic ointment, a non-stick pad and wrapped with curlex.  Patient also received a Tdap ,Left arm,vaccination as well.

## 2020-10-20 NOTE — Progress Notes (Signed)
Spoke  [employer] with the wound clinic after they reviewed you notes they want to have [place] send Patient to the ER, for Stat Blood flow tests, see if he is ischemic, and might need IV abx. That is what they are likely to tell him if he comes in to their office.

## 2020-10-20 NOTE — Progress Notes (Signed)
Please let patient know his WBC count is high and will need to take his abx   we will need to get repeat labs x tommorow and follow-up with his PCP in 2 to 3 days pl move appt up.he has an appt for next week  - if feels worse pl let him know to go to the ER>  Also needs to get an x-ray of the foot I do not see it being done I had ordered it STAT yesterday at his visit.  Thank you

## 2020-10-20 NOTE — Consult Note (Signed)
Pharmacy Antibiotic Note  [name] Day. is a 60 y.o. male admitted on 10/20/2020 with wound to his right foot w/ redness/swelling.  Pharmacy has been consulted for Vancomycin dosing for cellulitis. MRI and x-ray are both negative   Plan: Vancomycin 2000 mg LD given in ED Initiate Vancomycin 1500 mg Q12H. Goal AUC 400-550 Expected AUC 524 Scr used: 0.8  Will start maintenance regimen a little earlier than 12 hours due to lower LD ordered   Height: 6' (182.9 cm) Weight: 119 kg (262 lb 5.6 oz) IBW/kg (Calculated) : 77.6  Temp (24hrs), Avg:98.4 F (36.9 C), Min:98.4 F (36.9 C), Max:98.4 F (36.9 C)  Recent Labs  Lab 10/19/20 1607 10/20/20 1501  WBC 15.0* 13.5*  CREATININE  --  0.67  LATICACIDVEN  --  1.7    Estimated Creatinine Clearance: 134.1 mL/min (by C-G formula based on SCr of 0.67 mg/dL).    Allergies  Allergen Reactions   Tramadol Diarrhea    Severe Headaches    Antimicrobials this admission: 7/12 Zosyn >> x 1  7/12 ceftriaxone >>  7/12 Vancomycin >>   Dose adjustments this admission: N/a  Microbiology results: 7/12 BCx: sent    Thank you for allowing pharmacy to be a part of this patient's care.  Dorothe Pea, PharmD, BCPS Clinical Pharmacist   10/20/2020 6:20 PM

## 2020-10-20 NOTE — Plan of Care (Signed)

## 2020-10-20 NOTE — ED Triage Notes (Signed)
First Nurse Note:  C/O wound to bottom of right foot.  Sent to ED for evaluation.

## 2020-10-21 DIAGNOSIS — L97509 Non-pressure chronic ulcer of other part of unspecified foot with unspecified severity: Secondary | ICD-10-CM | POA: Diagnosis not present

## 2020-10-21 DIAGNOSIS — L0390 Cellulitis, unspecified: Secondary | ICD-10-CM

## 2020-10-21 DIAGNOSIS — E11621 Type 2 diabetes mellitus with foot ulcer: Secondary | ICD-10-CM | POA: Diagnosis not present

## 2020-10-21 LAB — CBC
HCT: 40.3 % (ref 39.0–52.0)
Hemoglobin: 14.1 g/dL (ref 13.0–17.0)
MCH: 29.5 pg (ref 26.0–34.0)
MCHC: 35 g/dL (ref 30.0–36.0)
MCV: 84.3 fL (ref 80.0–100.0)
Platelets: 221 10*3/uL (ref 150–400)
RBC: 4.78 MIL/uL (ref 4.22–5.81)
RDW: 13.3 % (ref 11.5–15.5)
WBC: 12 10*3/uL — ABNORMAL HIGH (ref 4.0–10.5)
nRBC: 0 % (ref 0.0–0.2)

## 2020-10-21 LAB — BASIC METABOLIC PANEL
Anion gap: 4 — ABNORMAL LOW (ref 5–15)
BUN: 10 mg/dL (ref 6–20)
CO2: 29 mmol/L (ref 22–32)
Calcium: 8.7 mg/dL — ABNORMAL LOW (ref 8.9–10.3)
Chloride: 101 mmol/L (ref 98–111)
Creatinine, Ser: 0.55 mg/dL — ABNORMAL LOW (ref 0.61–1.24)
GFR, Estimated: >60 mL/min (ref >60)
Glucose, Bld: 185 mg/dL — ABNORMAL HIGH (ref 70–99)
Potassium: 3.6 mmol/L (ref 3.5–5.1)
Sodium: 134 mmol/L — ABNORMAL LOW (ref 135–145)

## 2020-10-21 LAB — GLUCOSE, CAPILLARY
Glucose-Capillary: 225 mg/dL — ABNORMAL HIGH (ref 70–99)
Glucose-Capillary: 166 mg/dL — ABNORMAL HIGH (ref 70–99)
Glucose-Capillary: 193 mg/dL — ABNORMAL HIGH (ref 70–99)
Glucose-Capillary: 200 mg/dL — ABNORMAL HIGH (ref 70–99)

## 2020-10-21 MED ORDER — AMOXICILLIN-POT CLAVULANATE 875-125 MG PO TABS: 1.0000 | ORAL_TABLET | Freq: Two times a day (BID) | ORAL | 0 refills | Status: AC

## 2020-10-21 NOTE — Discharge Summary (Signed)
Physician Discharge Summary  [name] Day. STM:[id number] DOB: 1961/01/09 DOA: 10/20/2020  PCP: Jon Billings, NP  Admit date: 10/20/2020 Discharge date: 10/21/2020  Admitted From: home  Disposition:  home  Recommendations for Outpatient Follow-up:  Follow up with PCP in 1-2 weeks F/u w/ podiatry, Dr. Vickki Muff, in 2 weeks   Home Health: no  Equipment/Devices:  Discharge Condition: stable CODE STATUS: full  Diet recommendation: Heart Healthy / Carb Modified  Brief/Interim Summary: HPI was taken from [name]: [name] Day. is a 60 y.o. 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   Patient reports having what he thought was a plantar wart at the bottom of his right foot 3 months ago.  However did not start to bother him until about 3 days ago where he felt more pressure and later noticed bleeding from the wound.  He then presented to his PCP yesterday and was prescribed Bactrim which he has not started.  They then later discussed with wound care who recommended he be promptly admitted to ED for further evaluation.  He denies any chills or fevers.  No nausea, vomiting or diarrhea.   ED Course: He was afebrile normotensive, mildly tachycardic, with leukocytosis of 13.5.   MRI of the foot was recommended by Dr. Vickki Muff of podiatry in did not show any osteomyelitis however there is diffuse cellulitis and myositis around the plantar wound.   Hospital course from Dr. Jimmye Norman 10/21/20: Pt presented w/ sepsis likely secondary right plantar foot wound cellulitis. Pt is s/p bedside debridement of the ulceration was performed by podiatry. Podiatry recs patient should cleanse wound daily and apply antibiotic ointment and a bandage. Pt was d/c home w/ po augmentin x 5  days. Pt will f/u w/ podiatry, Dr. Vickki Muff, in 2 weeks     Discharge Diagnoses:  Principal Problem:   Wound cellulitis Active Problems:   S/P BKA (below knee amputation) unilateral, left ([place])   Morbid obesity ([place])   Hypertension associated with diabetes ([place])   Type 2 diabetes mellitus with foot ulcer ([place])   Tobacco use   Sepsis ([place])   Sepsis: met criteria w/ tachycardia, leukocytosis and right plantar foot wound cellulitis. No osteomyelitis seen on MRI. Continue on IV rocephin, zosyn while inpatient. Resolved    DM2: uncontrolled, w/ HbA1c 10.9. Continue on SSI w/ accuchecks  Peripheral neuropathy: continue on home dose of gabapentin    S/p left BKA: secondary to trauma. Continue w/ supportive care   Tobacco use: smokes half a pack per day. Smoking cessation counseling    Morbid obesity: BMI 34.6. Complicates overall care and prognosis   Discharge Instructions  Discharge Instructions     Diet - low sodium heart healthy   Complete by: As directed    Discharge instructions   Complete by: As directed    F/u w/ podiatry, Dr. Vickki Muff, in 2 weeks. F/u w/ PCP in 1-2 weeks.  Patient should cleanse wound daily and apply antibiotic ointment and a bandage.  Try to minimize ambulation to this forefoot over the next few weeks.   Increase activity slowly   Complete by: As directed       Allergies as of 10/21/2020       Reactions   Tramadol Diarrhea   Severe Headaches        Medication List  STOP taking these medications    sulfamethoxazole-trimethoprim 800-160 MG tablet Commonly known as: BACTRIM DS       TAKE these medications    acetaminophen 500 MG tablet Commonly known as: TYLENOL Take 500 mg by mouth every 6 (six) hours as needed.   AeroChamber MV inhaler Use as instructed   albuterol 108 (90 Base) MCG/ACT inhaler Commonly known as: VENTOLIN HFA Inhale 2 puffs into the lungs every 4 (four) hours as needed.   amoxicillin-clavulanate 875-125 MG  tablet Commonly known as: Augmentin Take 1 tablet by mouth 2 (two) times daily for 5 days.   gabapentin 600 MG tablet Commonly known as: Neurontin 1200 mg  BID   metFORMIN 500 MG tablet Commonly known as: GLUCOPHAGE Take 1 tablet (500 mg total) by mouth 2 (two) times daily with a meal.   naloxone 4 MG/0.1ML Liqd nasal spray kit Commonly known as: NARCAN As needed for witnessed or suspected opioid overdose       ASK your doctor about these medications    empagliflozin 10 MG Tabs tablet Commonly known as: Jardiance Take 1 tablet (10 mg total) by mouth daily before breakfast.   gentamicin cream 0.1 % Commonly known as: GARAMYCIN Apply 1 application topically 3 (three) times daily.   Trulicity 0.75 MG/0.5ML Sopn Generic drug: Dulaglutide Inject 0.75 mg into the skin once a week.        Follow-up Information     Gwyneth Revels, DPM Follow up in 2 week(s).   Specialty: Podiatry Contact information: [address] [place] [address] [phone]                Allergies  Allergen Reactions   Tramadol Diarrhea    Severe Headaches    Consultations: Podiatry    Procedures/Studies: MR FOOT RIGHT WO CONTRAST  Result Date: 10/20/2020 CLINICAL DATA:  Open wound on the plantar aspect of the foot. Assess for osteomyelitis or abscess. EXAM: MRI OF THE RIGHT FOREFOOT WITHOUT CONTRAST TECHNIQUE: Multiplanar, multisequence MR imaging of the right foot was performed. No intravenous contrast was administered. COMPARISON:  Radiographs, same date FINDINGS: There is an open wound noted on the plantar aspect of the forefoot located at the level of the second and third metatarsal heads. Diffuse subcutaneous soft tissue swelling/edema/fluid suggesting cellulitis. No discrete fluid collection to suggest an abscess. Moderate diffuse myositis without findings suspicious for pyomyositis. The joint spaces are maintained. No findings suspicious for septic arthritis. The bony  structures are intact. No findings suspicious for osteomyelitis. The major tendons and ligaments are grossly intact. The visualized mid and hindfoot bony structures are intact. IMPRESSION: 1. Open wound on the plantar aspect of the forefoot at the level of the second and third metatarsal heads. 2. Diffuse cellulitis and myositis but no discrete fluid collection to suggest an abscess or pyomyositis. 3. No findings for septic arthritis or osteomyelitis. Electronically Signed   By: Rudie Meyer M.D.   On: 10/20/2020 16:55   DG Foot Complete Right  Result Date: 10/20/2020 CLINICAL DATA:  Diabetic patient with a skin ulceration on the right foot. EXAM: RIGHT FOOT COMPLETE - 3+ VIEW COMPARISON:  None. FINDINGS: Skin ulceration is seen on the plantar surface of the foot. No underlying radiopaque foreign body is identified. No gas tracking within soft tissues is identified. No bony destructive change or periosteal reaction. No fracture or dislocation. IMPRESSION: Skin ulceration on the plantar surface of the foot. The exam is otherwise negative. Electronically Signed   By: Maisie Fus  Dalessio M.D.   On: 10/20/2020 16:23      Subjective: Pt c/o fatigue    Discharge Exam: Vitals:   10/21/20 0727 10/21/20 1147  BP: 134/90 118/68  Pulse: (!) 103 (!) 101  Resp: 15 18  Temp: 97.7 F (36.5 C) (!) 97.4 F (36.3 C)  SpO2: 96% 100%   Vitals:   10/21/20 0031 10/21/20 0407 10/21/20 0727 10/21/20 1147  BP: 123/69 104/73 134/90 118/68  Pulse: (!) 103 (!) 106 (!) 103 (!) 101  Resp: _0 Temp: 97.6 F (36.4 C) 98 F (36.7 C) 97.7 F (36.5 C) (!) 97.4 F (36.3 C)  TempSrc:   Oral Oral  SpO2: 96% 97% 96% 100%  Weight:      Height:        General: Pt is alert, awake, not in acute distress Cardiovascular: S1/S2 +, no rubs, no gallops Respiratory: CTA bilaterally, no wheezing, no rhonchi Abdominal: Soft, NT, obese, bowel sounds + Extremities:  no cyanosis    The results of significant  diagnostics from this hospitalization (including imaging, microbiology, ancillary and laboratory) are listed below for reference.     Microbiology: Recent Results (from the past 240 hour(s))  Anaerobic and Aerobic Culture     Status: Abnormal (Preliminary result)   Collection Time: 10/19/20  4:08 PM   Specimen: Ear, Right; Sterile Swab   WO  Result Value Ref Range Status   Anaerobic Culture WILL FOLLOW  Preliminary   Aerobic Culture Preliminary report (A)  Preliminary   Result 1 Gram negative rods (A)  Preliminary    Comment: Heavy growth   Result 2 Comment  Preliminary    Comment: Microbiological testing to rule out the presence of possible pathogens is in progress.    Result 3 Comment (A)  Preliminary    Comment: Beta hemolytic Streptococcus, group G Heavy growth Penicillin and ampicillin are drugs of choice for treatment of beta-hemolytic streptococcal infections. Susceptibility testing of penicillins and other beta-lactam agents approved by the FDA for treatment of beta-hemolytic streptococcal infections need not be performed routinely because nonsusceptible isolates are extremely rare in any beta-hemolytic streptococcus and have not been reported for Streptococcus pyogenes (group A). (CLSI)    Result 4 Mixed skin flora  Preliminary    Comment: Heavy growth  Resp Panel by RT-PCR (Flu A&B, Covid) Nasopharyngeal Swab     Status: None   Collection Time: 10/20/20  4:12 PM   Specimen: Nasopharyngeal Swab; Nasopharyngeal(NP) swabs in vial transport medium  Result Value Ref Range Status   SARS Coronavirus 2 by RT PCR NEGATIVE NEGATIVE Final    Comment: (NOTE) SARS-CoV-2 target nucleic acids are NOT DETECTED.  The SARS-CoV-2 RNA is generally detectable in upper respiratory specimens during the acute phase of infection. The lowest concentration of SARS-CoV-2 viral copies this assay can detect is 138 copies/mL. A negative result does not preclude SARS-Cov-2 infection and should  not be used as the sole basis for treatment or other patient management decisions. A negative result may occur with  improper specimen collection/handling, submission of specimen other than nasopharyngeal swab, presence of viral mutation(s) within the areas targeted by this assay, and inadequate number of viral copies(<138 copies/mL). A negative result must be combined with clinical observations, patient history, and epidemiological information. The expected result is Negative.  Fact Sheet for Patients:  EntrepreneurPulse.com.au  Fact Sheet for Healthcare Providers:  IncredibleEmployment.be  This test is no t yet approved or cleared by the [place] and  has been authorized for detection and/or diagnosis of SARS-CoV-2 by FDA under an Emergency Use Authorization (EUA). This EUA will remain  in effect (meaning this test can be used) for the duration of the COVID-19 declaration under Section 564(b)(1) of the Act, 21 U.S.C.section 360bbb-3(b)(1), unless the authorization is terminated  or revoked sooner.       Influenza A by PCR NEGATIVE NEGATIVE Final   Influenza B by PCR NEGATIVE NEGATIVE Final    Comment: (NOTE) The Xpert Xpress SARS-CoV-2/FLU/RSV plus assay is intended as an aid in the diagnosis of influenza from Nasopharyngeal swab specimens and should not be used as a sole basis for treatment. Nasal washings and aspirates are unacceptable for Xpert Xpress SARS-CoV-2/FLU/RSV testing.  Fact Sheet for Patients: EntrepreneurPulse.com.au  Fact Sheet for Healthcare Providers: IncredibleEmployment.be  This test is not yet approved or cleared by the [place] FDA and has been authorized for detection and/or diagnosis of SARS-CoV-2 by FDA under an Emergency Use Authorization (EUA). This EUA will remain in effect (meaning this test can be used) for the duration of the COVID-19 declaration under  Section 564(b)(1) of the Act, 21 U.S.C. section 360bbb-3(b)(1), unless the authorization is terminated or revoked.  Performed at [facility], [address] [place], [place] [address]   Blood culture (routine x 2)     Status: None (Preliminary result)   Collection Time: 10/20/20  4:12 PM   Specimen: BLOOD  Result Value Ref Range Status   Specimen Description BLOOD RIGHT ANTECUBITAL  Final   Special Requests   Final    BOTTLES DRAWN AEROBIC AND ANAEROBIC Blood Culture adequate volume   Culture   Final    NO GROWTH < 24 HOURS Performed at [facility], [address], [place], [place] [address]    Report Status PENDING  Incomplete  Blood culture (routine x 2)     Status: None (Preliminary result)   Collection Time: 10/20/20  4:12 PM   Specimen: BLOOD  Result Value Ref Range Status   Specimen Description BLOOD LEFT ANTECUBITAL  Final   Special Requests   Final    BOTTLES DRAWN AEROBIC AND ANAEROBIC Blood Culture adequate volume   Culture   Final    NO GROWTH < 24 HOURS Performed at [facility], [address] [place], [place] [address]    Report Status PENDING  Incomplete     Labs: BNP (last 3 results) No results for input(s): BNP in the last 8760 hours. Basic Metabolic Panel: Recent Labs  Lab 10/20/20 1501 10/21/20 0422  NA 134* 134*  K 3.8 3.6  CL 97* 101  CO2 29 29  GLUCOSE 320* 185*  BUN 11 10  CREATININE 0.67 0.55*  CALCIUM 9.4 8.7*   Liver Function Tests: Recent Labs  Lab 10/20/20 1501  AST 24  ALT 21  ALKPHOS 69  BILITOT 0.7  PROT 7.7  ALBUMIN 4.1   No results for input(s): LIPASE, AMYLASE in the last 168 hours. No results for input(s): AMMONIA in the last 168 hours. CBC: Recent Labs  Lab 10/19/20 1607 10/20/20 1501 10/21/20 0422  WBC 15.0* 13.5* 12.0*  NEUTROABS 9.5*  --   --   HGB 16.0 15.7 14.1  HCT 46.4 45.5 40.3  MCV 86 86.2 84.3  PLT 270 248 221   Cardiac Enzymes: No results for input(s): CKTOTAL,  CKMB, CKMBINDEX, TROPONINI in the last 168 hours. BNP: Invalid input(s): POCBNP CBG: Recent Labs  Lab 10/20/20 2052 10/21/20 0129 10/21/20 0409  10/21/20 0727 10/21/20 1147  GLUCAP 214* 225* 166* 193* 200*   D-Dimer No results for input(s): DDIMER in the last 72 hours. Hgb A1c Recent Labs    10/19/20 1603 10/20/20 1612  HGBA1C 10.9* 10.9*   Lipid Profile No results for input(s): CHOL, HDL, LDLCALC, TRIG, CHOLHDL, LDLDIRECT in the last 72 hours. Thyroid function studies No results for input(s): TSH, T4TOTAL, T3FREE, THYROIDAB in the last 72 hours.  Invalid input(s): FREET3 Anemia work up No results for input(s): VITAMINB12, FOLATE, FERRITIN, TIBC, IRON, RETICCTPCT in the last 72 hours. Urinalysis    Component Value Date/Time   COLORURINE YELLOW 08/20/2020 1210   APPEARANCEUR HAZY (A) 08/20/2020 1210   APPEARANCEUR Hazy (A) 12/05/2017 0912   LABSPEC 1.010 08/20/2020 1210   PHURINE 6.0 08/20/2020 1210   GLUCOSEU 500 (A) 08/20/2020 1210   HGBUR NEGATIVE 08/20/2020 1210   BILIRUBINUR NEGATIVE 08/20/2020 1210   BILIRUBINUR Negative 12/05/2017 0912   KETONESUR NEGATIVE 08/20/2020 1210   PROTEINUR NEGATIVE 08/20/2020 1210   NITRITE NEGATIVE 08/20/2020 1210   LEUKOCYTESUR NEGATIVE 08/20/2020 1210   Sepsis Labs Invalid input(s): PROCALCITONIN,  WBC,  LACTICIDVEN Microbiology Recent Results (from the past 240 hour(s))  Anaerobic and Aerobic Culture     Status: Abnormal (Preliminary result)   Collection Time: 10/19/20  4:08 PM   Specimen: Ear, Right; Sterile Swab   WO  Result Value Ref Range Status   Anaerobic Culture WILL FOLLOW  Preliminary   Aerobic Culture Preliminary report (A)  Preliminary   Result 1 Gram negative rods (A)  Preliminary    Comment: Heavy growth   Result 2 Comment  Preliminary    Comment: Microbiological testing to rule out the presence of possible pathogens is in progress.    Result 3 Comment (A)  Preliminary    Comment: Beta hemolytic  Streptococcus, group G Heavy growth Penicillin and ampicillin are drugs of choice for treatment of beta-hemolytic streptococcal infections. Susceptibility testing of penicillins and other beta-lactam agents approved by the FDA for treatment of beta-hemolytic streptococcal infections need not be performed routinely because nonsusceptible isolates are extremely rare in any beta-hemolytic streptococcus and have not been reported for Streptococcus pyogenes (group A). (CLSI)    Result 4 Mixed skin flora  Preliminary    Comment: Heavy growth  Resp Panel by RT-PCR (Flu A&B, Covid) Nasopharyngeal Swab     Status: None   Collection Time: 10/20/20  4:12 PM   Specimen: Nasopharyngeal Swab; Nasopharyngeal(NP) swabs in vial transport medium  Result Value Ref Range Status   SARS Coronavirus 2 by RT PCR NEGATIVE NEGATIVE Final    Comment: (NOTE) SARS-CoV-2 target nucleic acids are NOT DETECTED.  The SARS-CoV-2 RNA is generally detectable in upper respiratory specimens during the acute phase of infection. The lowest concentration of SARS-CoV-2 viral copies this assay can detect is 138 copies/mL. A negative result does not preclude SARS-Cov-2 infection and should not be used as the sole basis for treatment or other patient management decisions. A negative result may occur with  improper specimen collection/handling, submission of specimen other than nasopharyngeal swab, presence of viral mutation(s) within the areas targeted by this assay, and inadequate number of viral copies(<138 copies/mL). A negative result must be combined with clinical observations, patient history, and epidemiological information. The expected result is Negative.  Fact Sheet for Patients:  EntrepreneurPulse.com.au  Fact Sheet for Healthcare Providers:  IncredibleEmployment.be  This test is no t yet approved or cleared by the [place] and  has been authorized for  detection  and/or diagnosis of SARS-CoV-2 by FDA under an Emergency Use Authorization (EUA). This EUA will remain  in effect (meaning this test can be used) for the duration of the COVID-19 declaration under Section 564(b)(1) of the Act, 21 U.S.C.section 360bbb-3(b)(1), unless the authorization is terminated  or revoked sooner.       Influenza A by PCR NEGATIVE NEGATIVE Final   Influenza B by PCR NEGATIVE NEGATIVE Final    Comment: (NOTE) The Xpert Xpress SARS-CoV-2/FLU/RSV plus assay is intended as an aid in the diagnosis of influenza from Nasopharyngeal swab specimens and should not be used as a sole basis for treatment. Nasal washings and aspirates are unacceptable for Xpert Xpress SARS-CoV-2/FLU/RSV testing.  Fact Sheet for Patients: EntrepreneurPulse.com.au  Fact Sheet for Healthcare Providers: IncredibleEmployment.be  This test is not yet approved or cleared by the [place] FDA and has been authorized for detection and/or diagnosis of SARS-CoV-2 by FDA under an Emergency Use Authorization (EUA). This EUA will remain in effect (meaning this test can be used) for the duration of the COVID-19 declaration under Section 564(b)(1) of the Act, 21 U.S.C. section 360bbb-3(b)(1), unless the authorization is terminated or revoked.  Performed at [facility], [address] [place], [place] [address]   Blood culture (routine x 2)     Status: None (Preliminary result)   Collection Time: 10/20/20  4:12 PM   Specimen: BLOOD  Result Value Ref Range Status   Specimen Description BLOOD RIGHT ANTECUBITAL  Final   Special Requests   Final    BOTTLES DRAWN AEROBIC AND ANAEROBIC Blood Culture adequate volume   Culture   Final    NO GROWTH < 24 HOURS Performed at [facility], [address], [place], [place] [address]    Report Status PENDING  Incomplete  Blood culture (routine x 2)     Status: None (Preliminary result)   Collection  Time: 10/20/20  4:12 PM   Specimen: BLOOD  Result Value Ref Range Status   Specimen Description BLOOD LEFT ANTECUBITAL  Final   Special Requests   Final    BOTTLES DRAWN AEROBIC AND ANAEROBIC Blood Culture adequate volume   Culture   Final    NO GROWTH < 24 HOURS Performed at [facility], [address] [place], [place] [address]    Report Status PENDING  Incomplete     Time coordinating discharge: Over 30 minutes  SIGNED:   Wyvonnia Dusky, MD  [facility] 10/21/2020, 12:48 PM Pager   If 7PM-7AM, please contact night-coverage [email or web address]

## 2020-10-21 NOTE — Progress Notes (Signed)
[name] Day. to be D/C'd Home per MD order.  Discussed with the patient and all questions fully answered.  VSS, Skin clean, dry and intact without evidence of skin break down, no evidence of skin tears noted. IV catheters discontinued intact. Site without signs and symptoms of complications. Dressing and pressure applied. Post-op ortho shoe applied.  An After Visit Summary was printed and given to the patient. Patient prescriptions sent to pharmacy.  D/c education completed with patient/family including follow up instructions, medication list, d/c activities limitations if indicated, with other d/c instructions as indicated by MD - patient able to verbalize understanding, all questions fully answered.   Patient instructed to return to ED, call 911, or call MD for any changes in condition.   Patient escorted via [place], and D/C home via private auto.  Melton Walls L Katrenia Alkins 10/21/2020 1:00 PM

## 2020-10-21 NOTE — Progress Notes (Addendum)
Inpatient Diabetes Program Recommendations  AACE/ADA: New Consensus Statement on Inpatient Glycemic Control (2015)  Target Ranges:  Prepandial:   less than 140 mg/dL      Peak postprandial:   less than 180 mg/dL (1-2 hours)      Critically ill patients:  140 - 180 mg/dL   Results for [name] Day, [name] Day (MRN [id number]) as of 10/21/2020 07:26  Ref. Range 10/20/2020 17:30 10/20/2020 20:52 10/21/2020 01:29 10/21/2020 04:09  Glucose-Capillary Latest Ref Range: 70 - 99 mg/dL 277 (H)  8 units NOVOLOG  214 (H)  5 units NOVOLOG  225 (H)  5 units NOVOLOG  166 (H)  3 units NOVOLOG    Results for [name] Day, [name] Day (MRN [id number]) as of 10/21/2020 07:26  Ref. Range 08/20/2020 12:10 10/20/2020 16:12  Hemoglobin A1C Latest Ref Range: 4.8 - 5.6 % 12.8 (H) 10.9 (H)  (266 mg/dl)    Admit with: Sepsis secondary to Right plantar foot wound cellulitis   History: DM  Home DM Meds: Jardiance 10 mg Daily (NOT taking)       Trulicity 2.88 mg Qweek       Metformin 500 mg BID  Current Orders: Novolog Moderate Correction Scale/ SSI (0-15 units) Q4 hours   PCP: [name] Last visit 08/25/2020--A1c at that visit was 12.8% Pt agreed to restart Metformin and Trulcity at that visit  Note Novolog SSi started last PM.  CBG down to 166 at 4am today.  Addendum 11:20am--Met w/ pt at bedside this AM.  We discussed his last PCP visit and how he restarted the Metformin and Trulicity injection once weekly back in May.  Pt told me he was lax about taking meds prior to that visit but has been more diligent with taking his meds and is trying to eat better and improve his CBG control.  Daughter helps with Trulicity injections at home.  Has CBG meter at home.  Congratulated pt on improving his A1c from 12.8% down to 10.9%.  We discussed healthy CBGs and A1c for home.  Encouraged pt to check his CBGs at least BID at home (1st thing in the AM and vary the 2nd check of the day).  Also encouraged pt  to be consistent with  his meds at home.  Discussed with pt the importance of good CBG control at home for healing for his foot and prevention of further diabetes complications.  Pt admits to drinking Monster energy drinks at home 2-3 times per week.  We discussed how these drinks have a large amount of carbohydrates and that it is best to avoid drinks with carbohydrates (including juice, sweet tea, energy drinks, regular soda).  Pt told me he has been trying to cut down on the monster drinks and will attempt to stop them all together.  Admits to poor appetite sometimes.  We discussed how the Trulicity could be causing lower appetite.  Pt appreciative of visit and did not have any additional diabetes questions for me at this time.  Has follow up appt with [facility] practice on 07/15.     --Will follow patient during hospitalization--  Wyn Quaker RN, MSN, CDE Diabetes Coordinator Inpatient Glycemic Control Team Team Pager: [phone] (8a-5p)

## 2020-10-22 ENCOUNTER — Telehealth: Payer: Self-pay

## 2020-10-22 NOTE — Telephone Encounter (Signed)
Transition Care Management Unsuccessful Follow-up Telephone Call  Date of discharge and from where:  10/21/2020-[facility] ED  Attempts:  1st Attempt  Reason for unsuccessful TCM follow-up call:  Unable to leave message

## 2020-10-23 LAB — ANAEROBIC AND AEROBIC CULTURE

## 2020-10-25 LAB — CULTURE, BLOOD (ROUTINE X 2)
Culture: NO GROWTH
Culture: NO GROWTH
Special Requests: ADEQUATE
Special Requests: ADEQUATE

## 2020-10-26 ENCOUNTER — Encounter: Payer: Self-pay | Admitting: Nurse Practitioner

## 2020-10-26 ENCOUNTER — Other Ambulatory Visit: Payer: Self-pay

## 2020-10-26 ENCOUNTER — Ambulatory Visit: Payer: Medicaid Other | Admitting: Nurse Practitioner

## 2020-10-26 VITALS — BP 155/93 | HR 106 | Temp 98.9°F

## 2020-10-26 DIAGNOSIS — N529 Male erectile dysfunction, unspecified: Secondary | ICD-10-CM | POA: Diagnosis not present

## 2020-10-26 DIAGNOSIS — T148XXA Other injury of unspecified body region, initial encounter: Secondary | ICD-10-CM | POA: Diagnosis not present

## 2020-10-26 DIAGNOSIS — L089 Local infection of the skin and subcutaneous tissue, unspecified: Secondary | ICD-10-CM

## 2020-10-26 NOTE — Telephone Encounter (Signed)
Transition Care Management Unsuccessful Follow-up Telephone Call  Date of discharge and from where:  10/21/2020-[facility] ED   Attempts:  2nd Attempt  Reason for unsuccessful TCM follow-up call:  Voice mail full

## 2020-10-26 NOTE — Progress Notes (Signed)
BP (!) 155/93   Pulse (!) 106   Temp 98.9 F (37.2 C)   SpO2 98%    Subjective:    Patient ID: [name] Amy., male    DOB: 10/19/1960, 60 y.o.   MRN: [id number]  HPI: [name] Smick. is a 60 y.o. male  Chief Complaint  Patient presents with   Wound Check   Patient presents to clinic for a wound check after seeing another provider last week and was seen by wound clinic after. Patient states that wound is looking a lot better. Denies any pain, fever, drainage.  Per patient was previously the size of quarter.  Patient states he is having some erectile dysfunction. He still has the urge to be sexually active but he is not able to sustain an erection.  Patient wondering if his testosterone is low.   Relevant past medical, surgical, family and social history reviewed and updated as indicated. Interim medical history since our last visit reviewed. Allergies and medications reviewed and updated.  Review of Systems  Skin:  Positive for wound.   Per HPI unless specifically indicated above     Objective:    BP (!) 155/93   Pulse (!) 106   Temp 98.9 F (37.2 C)   SpO2 98%   Wt Readings from Last 3 Encounters:  10/20/20 262 lb 5.6 oz (119 kg)  10/19/20 262 lb 9.6 oz (119.1 kg)  08/25/20 265 lb (120.2 kg)    Physical Exam Vitals and nursing note reviewed.  Constitutional:      General: He is not in acute distress.    Appearance: Normal appearance. He is not ill-appearing, toxic-appearing or diaphoretic.  HENT:     Head: Normocephalic.     Right Ear: External ear normal.     Left Ear: External ear normal.     Nose: Nose normal. No congestion or rhinorrhea.     Mouth/Throat:     Mouth: Mucous membranes are moist.  Eyes:     General:        Right eye: No discharge.        Left eye: No discharge.     Extraocular Movements: Extraocular movements intact.     Conjunctiva/sclera: Conjunctivae normal.     Pupils: Pupils are equal, round, and reactive to  light.  Cardiovascular:     Rate and Rhythm: Normal rate and regular rhythm.     Heart sounds: No murmur heard. Pulmonary:     Effort: Pulmonary effort is normal. No respiratory distress.     Breath sounds: Normal breath sounds. No wheezing, rhonchi or rales.  Abdominal:     General: Abdomen is flat. Bowel sounds are normal.  Musculoskeletal:     Cervical back: Normal range of motion and neck supple.       Feet:  Skin:    General: Skin is warm and dry.     Capillary Refill: Capillary refill takes less than 2 seconds.  Neurological:     General: No focal deficit present.     Mental Status: He is alert and oriented to person, place, and time.  Psychiatric:        Mood and Affect: Mood normal.        Behavior: Behavior normal.        Thought Content: Thought content normal.        Judgment: Judgment normal.    Results for orders placed or performed during the hospital encounter of 10/20/20  Resp Panel  by RT-PCR (Flu A&B, Covid) Nasopharyngeal Swab   Specimen: Nasopharyngeal Swab; Nasopharyngeal(NP) swabs in vial transport medium  Result Value Ref Range   SARS Coronavirus 2 by RT PCR NEGATIVE NEGATIVE   Influenza A by PCR NEGATIVE NEGATIVE   Influenza B by PCR NEGATIVE NEGATIVE  Blood culture (routine x 2)   Specimen: BLOOD  Result Value Ref Range   Specimen Description BLOOD RIGHT ANTECUBITAL    Special Requests      BOTTLES DRAWN AEROBIC AND ANAEROBIC Blood Culture adequate volume   Culture      NO GROWTH 5 DAYS Performed at [facility], [address] [place], [place] [address]    Report Status 10/25/2020 FINAL   Blood culture (routine x 2)   Specimen: BLOOD  Result Value Ref Range   Specimen Description BLOOD LEFT ANTECUBITAL    Special Requests      BOTTLES DRAWN AEROBIC AND ANAEROBIC Blood Culture adequate volume   Culture      NO GROWTH 5 DAYS Performed at [facility], [address] [place], [place] [address]    Report Status  10/25/2020 FINAL   CBC  Result Value Ref Range   WBC 13.5 (H) 4.0 - 10.5 K/uL   RBC 5.28 4.22 - 5.81 MIL/uL   Hemoglobin 15.7 13.0 - 17.0 g/dL   HCT 45.5 39.0 - 52.0 %   MCV 86.2 80.0 - 100.0 fL   MCH 29.7 26.0 - 34.0 pg   MCHC 34.5 30.0 - 36.0 g/dL   RDW 13.3 11.5 - 15.5 %   Platelets 248 150 - 400 K/uL   nRBC 0.0 0.0 - 0.2 %  Comprehensive metabolic panel  Result Value Ref Range   Sodium 134 (L) 135 - 145 mmol/L   Potassium 3.8 3.5 - 5.1 mmol/L   Chloride 97 (L) 98 - 111 mmol/L   CO2 29 22 - 32 mmol/L   Glucose, Bld 320 (H) 70 - 99 mg/dL   BUN 11 6 - 20 mg/dL   Creatinine, Ser 0.67 0.61 - 1.24 mg/dL   Calcium 9.4 8.9 - 10.3 mg/dL   Total Protein 7.7 6.5 - 8.1 g/dL   Albumin 4.1 3.5 - 5.0 g/dL   AST 24 15 - 41 U/L   ALT 21 0 - 44 U/L   Alkaline Phosphatase 69 38 - 126 U/L   Total Bilirubin 0.7 0.3 - 1.2 mg/dL   GFR, Estimated >60 >60 mL/min   Anion gap 8 5 - 15  Lactic acid, plasma  Result Value Ref Range   Lactic Acid, Venous 1.7 0.5 - 1.9 mmol/L  Sedimentation rate  Result Value Ref Range   Sed Rate 41 (H) 0 - 20 mm/hr  C-reactive protein  Result Value Ref Range   CRP 1.5 (H) <1.0 mg/dL  Hemoglobin A1c  Result Value Ref Range   Hgb A1c MFr Bld 10.9 (H) 4.8 - 5.6 %   Mean Plasma Glucose 266.13 mg/dL  Basic metabolic panel  Result Value Ref Range   Sodium 134 (L) 135 - 145 mmol/L   Potassium 3.6 3.5 - 5.1 mmol/L   Chloride 101 98 - 111 mmol/L   CO2 29 22 - 32 mmol/L   Glucose, Bld 185 (H) 70 - 99 mg/dL   BUN 10 6 - 20 mg/dL   Creatinine, Ser 0.55 (L) 0.61 - 1.24 mg/dL   Calcium 8.7 (L) 8.9 - 10.3 mg/dL   GFR, Estimated >60 >60 mL/min   Anion gap 4 (L) 5 -  15  CBC  Result Value Ref Range   WBC 12.0 (H) 4.0 - 10.5 K/uL   RBC 4.78 4.22 - 5.81 MIL/uL   Hemoglobin 14.1 13.0 - 17.0 g/dL   HCT 40.3 39.0 - 52.0 %   MCV 84.3 80.0 - 100.0 fL   MCH 29.5 26.0 - 34.0 pg   MCHC 35.0 30.0 - 36.0 g/dL   RDW 13.3 11.5 - 15.5 %   Platelets 221 150 - 400 K/uL   nRBC  0.0 0.0 - 0.2 %  Glucose, capillary  Result Value Ref Range   Glucose-Capillary 214 (H) 70 - 99 mg/dL  Glucose, capillary  Result Value Ref Range   Glucose-Capillary 225 (H) 70 - 99 mg/dL  Glucose, capillary  Result Value Ref Range   Glucose-Capillary 166 (H) 70 - 99 mg/dL  Glucose, capillary  Result Value Ref Range   Glucose-Capillary 193 (H) 70 - 99 mg/dL  Glucose, capillary  Result Value Ref Range   Glucose-Capillary 200 (H) 70 - 99 mg/dL  CBG monitoring, ED  Result Value Ref Range   Glucose-Capillary 277 (H) 70 - 99 mg/dL   Comment 1 Notify RN    Comment 2 Document in Chart       Assessment & Plan:   Problem List Items Addressed This Visit   None Visit Diagnoses     Wound infection    -  Primary   Continue with keeping wound clean. Patient has appointment with Podiatry tomorrow to follow up on wound. Return to clinic if symptoms worsen or fail to improve.   Erectile dysfunction, unspecified erectile dysfunction type       Testosterone ordered to be drawn first thing in the am. Educated patient regarding options for therapy and next steps after labs. Also discussed diabetes and ED   Relevant Orders   Testosterone        Follow up plan: No follow-ups on file.

## 2020-10-27 ENCOUNTER — Other Ambulatory Visit: Payer: Medicaid Other

## 2020-10-27 ENCOUNTER — Ambulatory Visit: Payer: Self-pay | Admitting: Podiatry

## 2020-10-27 NOTE — Telephone Encounter (Signed)
Transition Care Management Unsuccessful Follow-up Telephone Call  Date of discharge and from where:  10/21/2020-[facility] ED  Attempts:  3rd Attempt  Reason for unsuccessful TCM follow-up call:  Voice mail full

## 2020-11-03 ENCOUNTER — Ambulatory Visit (INDEPENDENT_AMBULATORY_CARE_PROVIDER_SITE_OTHER): Payer: Medicaid Other

## 2020-11-03 ENCOUNTER — Ambulatory Visit: Payer: Medicaid Other | Admitting: Podiatry

## 2020-11-03 ENCOUNTER — Other Ambulatory Visit: Payer: Self-pay

## 2020-11-03 DIAGNOSIS — E08621 Diabetes mellitus due to underlying condition with foot ulcer: Secondary | ICD-10-CM

## 2020-11-03 DIAGNOSIS — L97512 Non-pressure chronic ulcer of other part of right foot with fat layer exposed: Secondary | ICD-10-CM | POA: Diagnosis not present

## 2020-11-03 NOTE — Progress Notes (Signed)
   Subjective:  60 y.o. male with PMHx of diabetes mellitus plantar aspect of the right forefoot has been present for about 2-4 weeks.  He was in the hospital and upon discharge she was recommended to follow-up here in the office.  Patient is diabetic.  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  He presents for further treatment evaluation   Past Medical History:  Diagnosis Date   Arthritis    hands   Depression    Diabetes mellitus without complication ([place])    Dyspnea    Hx of BKA ([place])    due to complicated fracture wears prosthesis   Panic attack    Wears dentures    upper and lower full plate       Objective/Physical Exam General: The patient is alert and oriented x3 in no acute distress.  Dermatology:  Wound #1 noted to the plantar aspect of the right forefoot measuring approximately 0.5 x 0.6 x 0.2 cm (LxWxD).   To the noted ulceration(s), there is no eschar. There is a moderate amount of slough, fibrin, and necrotic tissue noted. Granulation tissue and wound base is red. There is a minimal amount of serosanguineous drainage noted. There is no exposed bone muscle-tendon ligament or joint. There is no malodor. Periwound integrity is intact. Skin is warm, dry and supple bilateral lower extremities.  Vascular: Palpable pedal pulses bilaterally. No edema or erythema noted. Capillary refill within normal limits.  Neurological: Epicritic and protective threshold diminished bilaterally.   Musculoskeletal Exam: History of BKA LLE.  Radiographic exam: Joint spaces preserved.  No cortical erosions along the heads of the metatarsals.  No radiographic evidence of osteomyelitis at the moment  Assessment: 1.  Ulcer right plantar forefoot secondary to diabetes mellitus 2. diabetes mellitus w/ peripheral neuropathy   Plan of Care:  1. Patient was evaluated. 2. medically necessary excisional debridement including subcutaneous tissue was performed using a tissue  nipper and a chisel blade. Excisional debridement of all the necrotic nonviable tissue down to healthy bleeding viable tissue was performed with post-debridement measurements same as pre-. 3. the wound was cleansed and dry sterile dressing applied. 4.  Betadine ointment provided.  Apply daily  5.  Continue postsurgical shoe.   6.  Offloading felt metatarsal pads were applied to the insole of the postsurgical shoe.   7.  Patient is to return to clinic in 3 weeks.   Edrick Kins, DPM [facility]  Dr. Edrick Kins, DPM    [address], [place] [address]                Office [phone]  Fax [phone]

## 2020-11-24 ENCOUNTER — Other Ambulatory Visit: Payer: Self-pay

## 2020-11-24 ENCOUNTER — Ambulatory Visit: Payer: Medicaid Other | Admitting: Podiatry

## 2020-11-24 DIAGNOSIS — L97512 Non-pressure chronic ulcer of other part of right foot with fat layer exposed: Secondary | ICD-10-CM | POA: Diagnosis not present

## 2020-11-24 DIAGNOSIS — E08621 Diabetes mellitus due to underlying condition with foot ulcer: Secondary | ICD-10-CM

## 2020-11-24 NOTE — Progress Notes (Signed)
   Subjective:  60 y.o. male with PMHx of diabetes mellitus presenting for follow-up evaluation of a plantar diabetic foot ulcer RT foot.  History of BKA LLE.  Patient continues to have some pain and sensitivity to the area.  Overall he says there is some improvement.  He has been wearing the postsurgical shoe with offloading felt metatarsal pads.  No new complaints at this time   Past Medical History:  Diagnosis Date   Arthritis    hands   Depression    Diabetes mellitus without complication ([place])    Dyspnea    Hx of BKA ([place])    due to complicated fracture wears prosthesis   Panic attack    Wears dentures    upper and lower full plate       Objective/Physical Exam General: The patient is alert and oriented x3 in no acute distress.  Dermatology:  Wound #1 noted to the plantar aspect of the right forefoot measuring approximately 0.3 x 0.4 x 0.2 cm (LxWxD).   To the noted ulceration(s), there is no eschar. There is a minimal amount of slough, fibrin, and necrotic tissue noted. Granulation tissue and wound base is red. There is a minimal amount of serosanguineous drainage noted. There is no exposed bone muscle-tendon ligament or joint. There is no malodor. Periwound integrity is intact. Skin is warm, dry and supple bilateral lower extremities.  Vascular: Palpable pedal pulses bilaterally. No edema or erythema noted. Capillary refill within normal limits.  Neurological: Epicritic and protective threshold diminished bilaterally.   Musculoskeletal Exam: History of BKA LLE.  Assessment: 1.  Ulcer right plantar forefoot secondary to diabetes mellitus 2. diabetes mellitus w/ peripheral neuropathy   Plan of Care:  1. Patient was evaluated. 2. medically necessary excisional debridement including subcutaneous tissue was performed using a tissue nipper and a chisel blade. Excisional debridement of all the necrotic nonviable tissue down to healthy bleeding viable tissue was performed  with post-debridement measurements same as pre-. 3. the wound was cleansed and dry sterile dressing applied. 4.  Betadine ointment provided.  Continue to apply daily  5.  Continue postsurgical shoe with offloading felt metatarsal pads.   6.  Return to clinic in 3-4 weeks.   Edrick Kins, DPM [facility]  Dr. Edrick Kins, DPM    [address], [place] [address]                Office [phone]  Fax [phone]

## 2020-12-16 NOTE — Progress Notes (Deleted)
There were no vitals taken for this visit.   Subjective:    Patient ID: [name] Amy., male    DOB: 10-May-1960, 60 y.o.   MRN: [id number]  HPI: [name] Semo. is a 60 y.o. male  No chief complaint on file.   Relevant past medical, surgical, family and social history reviewed and updated as indicated. Interim medical history since our last visit reviewed. Allergies and medications reviewed and updated.  Review of Systems  Per HPI unless specifically indicated above     Objective:    There were no vitals taken for this visit.  Wt Readings from Last 3 Encounters:  10/20/20 262 lb 5.6 oz (119 kg)  10/19/20 262 lb 9.6 oz (119.1 kg)  08/25/20 265 lb (120.2 kg)    Physical Exam  Results for orders placed or performed during the hospital encounter of 10/20/20  Resp Panel by RT-PCR (Flu A&B, Covid) Nasopharyngeal Swab   Specimen: Nasopharyngeal Swab; Nasopharyngeal(NP) swabs in vial transport medium  Result Value Ref Range   SARS Coronavirus 2 by RT PCR NEGATIVE NEGATIVE   Influenza A by PCR NEGATIVE NEGATIVE   Influenza B by PCR NEGATIVE NEGATIVE  Blood culture (routine x 2)   Specimen: BLOOD  Result Value Ref Range   Specimen Description BLOOD RIGHT ANTECUBITAL    Special Requests      BOTTLES DRAWN AEROBIC AND ANAEROBIC Blood Culture adequate volume   Culture      NO GROWTH 5 DAYS Performed at [facility], [address] [place], [place] [address]    Report Status 10/25/2020 FINAL   Blood culture (routine x 2)   Specimen: BLOOD  Result Value Ref Range   Specimen Description BLOOD LEFT ANTECUBITAL    Special Requests      BOTTLES DRAWN AEROBIC AND ANAEROBIC Blood Culture adequate volume   Culture      NO GROWTH 5 DAYS Performed at [facility], [address] [place], [place] [address]    Report Status 10/25/2020 FINAL   CBC  Result Value Ref Range   WBC 13.5 (H) 4.0 - 10.5 K/uL   RBC 5.28 4.22 - 5.81 MIL/uL    Hemoglobin 15.7 13.0 - 17.0 g/dL   HCT 45.5 39.0 - 52.0 %   MCV 86.2 80.0 - 100.0 fL   MCH 29.7 26.0 - 34.0 pg   MCHC 34.5 30.0 - 36.0 g/dL   RDW 13.3 11.5 - 15.5 %   Platelets 248 150 - 400 K/uL   nRBC 0.0 0.0 - 0.2 %  Comprehensive metabolic panel  Result Value Ref Range   Sodium 134 (L) 135 - 145 mmol/L   Potassium 3.8 3.5 - 5.1 mmol/L   Chloride 97 (L) 98 - 111 mmol/L   CO2 29 22 - 32 mmol/L   Glucose, Bld 320 (H) 70 - 99 mg/dL   BUN 11 6 - 20 mg/dL   Creatinine, Ser 0.67 0.61 - 1.24 mg/dL   Calcium 9.4 8.9 - 10.3 mg/dL   Total Protein 7.7 6.5 - 8.1 g/dL   Albumin 4.1 3.5 - 5.0 g/dL   AST 24 15 - 41 U/L   ALT 21 0 - 44 U/L   Alkaline Phosphatase 69 38 - 126 U/L   Total Bilirubin 0.7 0.3 - 1.2 mg/dL   GFR, Estimated >60 >60 mL/min   Anion gap 8 5 - 15  Lactic acid, plasma  Result Value Ref Range   Lactic Acid, Venous 1.7 0.5 -  1.9 mmol/L  Sedimentation rate  Result Value Ref Range   Sed Rate 41 (H) 0 - 20 mm/hr  C-reactive protein  Result Value Ref Range   CRP 1.5 (H) <1.0 mg/dL  Hemoglobin A1c  Result Value Ref Range   Hgb A1c MFr Bld 10.9 (H) 4.8 - 5.6 %   Mean Plasma Glucose 266.13 mg/dL  Basic metabolic panel  Result Value Ref Range   Sodium 134 (L) 135 - 145 mmol/L   Potassium 3.6 3.5 - 5.1 mmol/L   Chloride 101 98 - 111 mmol/L   CO2 29 22 - 32 mmol/L   Glucose, Bld 185 (H) 70 - 99 mg/dL   BUN 10 6 - 20 mg/dL   Creatinine, Ser 0.55 (L) 0.61 - 1.24 mg/dL   Calcium 8.7 (L) 8.9 - 10.3 mg/dL   GFR, Estimated >60 >60 mL/min   Anion gap 4 (L) 5 - 15  CBC  Result Value Ref Range   WBC 12.0 (H) 4.0 - 10.5 K/uL   RBC 4.78 4.22 - 5.81 MIL/uL   Hemoglobin 14.1 13.0 - 17.0 g/dL   HCT 40.3 39.0 - 52.0 %   MCV 84.3 80.0 - 100.0 fL   MCH 29.5 26.0 - 34.0 pg   MCHC 35.0 30.0 - 36.0 g/dL   RDW 13.3 11.5 - 15.5 %   Platelets 221 150 - 400 K/uL   nRBC 0.0 0.0 - 0.2 %  Glucose, capillary  Result Value Ref Range   Glucose-Capillary 214 (H) 70 - 99 mg/dL  Glucose,  capillary  Result Value Ref Range   Glucose-Capillary 225 (H) 70 - 99 mg/dL  Glucose, capillary  Result Value Ref Range   Glucose-Capillary 166 (H) 70 - 99 mg/dL  Glucose, capillary  Result Value Ref Range   Glucose-Capillary 193 (H) 70 - 99 mg/dL  Glucose, capillary  Result Value Ref Range   Glucose-Capillary 200 (H) 70 - 99 mg/dL  CBG monitoring, ED  Result Value Ref Range   Glucose-Capillary 277 (H) 70 - 99 mg/dL   Comment 1 Notify RN    Comment 2 Document in Chart       Assessment & Plan:   Problem List Items Addressed This Visit       Other   S/P BKA (below knee amputation) unilateral, left ([facility]) - Primary     Follow up plan: No follow-ups on file.

## 2020-12-17 ENCOUNTER — Ambulatory Visit: Payer: Medicaid Other | Admitting: Nurse Practitioner

## 2020-12-17 ENCOUNTER — Encounter: Payer: Self-pay | Admitting: Nurse Practitioner

## 2020-12-17 ENCOUNTER — Other Ambulatory Visit: Payer: Self-pay

## 2020-12-17 ENCOUNTER — Ambulatory Visit (INDEPENDENT_AMBULATORY_CARE_PROVIDER_SITE_OTHER): Payer: Medicaid Other | Admitting: Nurse Practitioner

## 2020-12-17 VITALS — BP 116/81 | HR 140 | Temp 98.1°F | Wt 261.8 lb

## 2020-12-17 DIAGNOSIS — R7989 Other specified abnormal findings of blood chemistry: Secondary | ICD-10-CM

## 2020-12-17 DIAGNOSIS — R809 Proteinuria, unspecified: Secondary | ICD-10-CM

## 2020-12-17 DIAGNOSIS — E1129 Type 2 diabetes mellitus with other diabetic kidney complication: Secondary | ICD-10-CM | POA: Diagnosis not present

## 2020-12-17 DIAGNOSIS — Z89512 Acquired absence of left leg below knee: Secondary | ICD-10-CM

## 2020-12-17 DIAGNOSIS — M25811 Other specified joint disorders, right shoulder: Secondary | ICD-10-CM | POA: Diagnosis not present

## 2020-12-17 MED ORDER — TRULICITY 0.75 MG/0.5ML ~~LOC~~ SOAJ: 0.7500 mg | SUBCUTANEOUS | 4 refills | Status: DC

## 2020-12-17 NOTE — Progress Notes (Signed)
BP 116/81   Pulse (!) 140   Temp 98.1 F (36.7 C) (Oral)   Wt 261 lb 12.8 oz (118.8 kg)   SpO2 98%   BMI 34.54 kg/m    Subjective:    Patient ID: [name] Amy., male    DOB: 20-Jun-1960, 60 y.o.   MRN: [id number]  HPI: [name] Klemz. is a 60 y.o. male  Chief Complaint  Patient presents with   prosthetic prescription    Pt states he needs sleeves and liners for his prosthetic    [facility] in [place].  Sleeves and liners and new prosthetic if possible due to recent weight loss.  Patient states he needs a prescription faxed to [facility] and they will fit him for the sleeve and liners.    Patient states that he went to get the MRI done that was ordered but his shoulders were too wide and they don't him he would need an open MRI.  Patient hasn't heard anything about the new MRI appt.   Denies HA, CP, SOB, dizziness, palpitations, visual changes, and lower extremity swelling.     Relevant past medical, surgical, family and social history reviewed and updated as indicated. Interim medical history since our last visit reviewed. Allergies and medications reviewed and updated.  Review of Systems  Eyes:  Negative for visual disturbance.  Respiratory:  Negative for shortness of breath.   Cardiovascular:  Negative for chest pain and leg swelling.  Neurological:  Negative for light-headedness and headaches.   Per HPI unless specifically indicated above     Objective:    BP 116/81   Pulse (!) 140   Temp 98.1 F (36.7 C) (Oral)   Wt 261 lb 12.8 oz (118.8 kg)   SpO2 98%   BMI 34.54 kg/m   Wt Readings from Last 3 Encounters:  12/17/20 261 lb 12.8 oz (118.8 kg)  10/20/20 262 lb 5.6 oz (119 kg)  10/19/20 262 lb 9.6 oz (119.1 kg)    Physical Exam Vitals and nursing note reviewed.  Constitutional:      General: He is not in acute distress.    Appearance: Normal appearance. He is not ill-appearing,  toxic-appearing or diaphoretic.  HENT:     Head: Normocephalic.     Right Ear: External ear normal.     Left Ear: External ear normal.     Nose: Nose normal. No congestion or rhinorrhea.     Mouth/Throat:     Mouth: Mucous membranes are moist.  Eyes:     General:        Right eye: No discharge.        Left eye: No discharge.     Extraocular Movements: Extraocular movements intact.     Conjunctiva/sclera: Conjunctivae normal.     Pupils: Pupils are equal, round, and reactive to light.  Cardiovascular:     Rate and Rhythm: Normal rate and regular rhythm.     Heart sounds: No murmur heard. Pulmonary:     Effort: Pulmonary effort is normal. No respiratory distress.     Breath sounds: Normal breath sounds. No wheezing, rhonchi or rales.  Abdominal:     General: Abdomen is flat. Bowel sounds are normal.  Musculoskeletal:     Cervical back: Normal range of motion and neck supple.     Comments: Left BKA  Skin:    General: Skin is warm and dry.     Capillary Refill: Capillary refill takes  less than 2 seconds.  Neurological:     General: No focal deficit present.     Mental Status: He is alert and oriented to person, place, and time.  Psychiatric:        Mood and Affect: Mood normal.        Behavior: Behavior normal.        Thought Content: Thought content normal.        Judgment: Judgment normal.    Results for orders placed or performed during the hospital encounter of 10/20/20  Resp Panel by RT-PCR (Flu A&B, Covid) Nasopharyngeal Swab   Specimen: Nasopharyngeal Swab; Nasopharyngeal(NP) swabs in vial transport medium  Result Value Ref Range   SARS Coronavirus 2 by RT PCR NEGATIVE NEGATIVE   Influenza A by PCR NEGATIVE NEGATIVE   Influenza B by PCR NEGATIVE NEGATIVE  Blood culture (routine x 2)   Specimen: BLOOD  Result Value Ref Range   Specimen Description BLOOD RIGHT ANTECUBITAL    Special Requests      BOTTLES DRAWN AEROBIC AND ANAEROBIC Blood Culture adequate volume    Culture      NO GROWTH 5 DAYS Performed at [facility] [address] [place], [place] [address]    Report Status 10/25/2020 FINAL   Blood culture (routine x 2)   Specimen: BLOOD  Result Value Ref Range   Specimen Description BLOOD LEFT ANTECUBITAL    Special Requests      BOTTLES DRAWN AEROBIC AND ANAEROBIC Blood Culture adequate volume   Culture      NO GROWTH 5 DAYS Performed at [facility], [address] [place], [place] [address]    Report Status 10/25/2020 FINAL   CBC  Result Value Ref Range   WBC 13.5 (H) 4.0 - 10.5 K/uL   RBC 5.28 4.22 - 5.81 MIL/uL   Hemoglobin 15.7 13.0 - 17.0 g/dL   HCT 45.5 39.0 - 52.0 %   MCV 86.2 80.0 - 100.0 fL   MCH 29.7 26.0 - 34.0 pg   MCHC 34.5 30.0 - 36.0 g/dL   RDW 13.3 11.5 - 15.5 %   Platelets 248 150 - 400 K/uL   nRBC 0.0 0.0 - 0.2 %  Comprehensive metabolic panel  Result Value Ref Range   Sodium 134 (L) 135 - 145 mmol/L   Potassium 3.8 3.5 - 5.1 mmol/L   Chloride 97 (L) 98 - 111 mmol/L   CO2 29 22 - 32 mmol/L   Glucose, Bld 320 (H) 70 - 99 mg/dL   BUN 11 6 - 20 mg/dL   Creatinine, Ser 0.67 0.61 - 1.24 mg/dL   Calcium 9.4 8.9 - 10.3 mg/dL   Total Protein 7.7 6.5 - 8.1 g/dL   Albumin 4.1 3.5 - 5.0 g/dL   AST 24 15 - 41 U/L   ALT 21 0 - 44 U/L   Alkaline Phosphatase 69 38 - 126 U/L   Total Bilirubin 0.7 0.3 - 1.2 mg/dL   GFR, Estimated >60 >60 mL/min   Anion gap 8 5 - 15  Lactic acid, plasma  Result Value Ref Range   Lactic Acid, Venous 1.7 0.5 - 1.9 mmol/L  Sedimentation rate  Result Value Ref Range   Sed Rate 41 (H) 0 - 20 mm/hr  C-reactive protein  Result Value Ref Range   CRP 1.5 (H) <1.0 mg/dL  Hemoglobin A1c  Result Value Ref Range   Hgb A1c MFr Bld 10.9 (H) 4.8 - 5.6 %   Mean Plasma Glucose  266.13 mg/dL  Basic metabolic panel  Result Value Ref Range   Sodium 134 (L) 135 - 145 mmol/L   Potassium 3.6 3.5 - 5.1 mmol/L   Chloride 101 98 - 111 mmol/L   CO2 29 22 - 32 mmol/L   Glucose,  Bld 185 (H) 70 - 99 mg/dL   BUN 10 6 - 20 mg/dL   Creatinine, Ser 0.55 (L) 0.61 - 1.24 mg/dL   Calcium 8.7 (L) 8.9 - 10.3 mg/dL   GFR, Estimated >60 >60 mL/min   Anion gap 4 (L) 5 - 15  CBC  Result Value Ref Range   WBC 12.0 (H) 4.0 - 10.5 K/uL   RBC 4.78 4.22 - 5.81 MIL/uL   Hemoglobin 14.1 13.0 - 17.0 g/dL   HCT 40.3 39.0 - 52.0 %   MCV 84.3 80.0 - 100.0 fL   MCH 29.5 26.0 - 34.0 pg   MCHC 35.0 30.0 - 36.0 g/dL   RDW 13.3 11.5 - 15.5 %   Platelets 221 150 - 400 K/uL   nRBC 0.0 0.0 - 0.2 %  Glucose, capillary  Result Value Ref Range   Glucose-Capillary 214 (H) 70 - 99 mg/dL  Glucose, capillary  Result Value Ref Range   Glucose-Capillary 225 (H) 70 - 99 mg/dL  Glucose, capillary  Result Value Ref Range   Glucose-Capillary 166 (H) 70 - 99 mg/dL  Glucose, capillary  Result Value Ref Range   Glucose-Capillary 193 (H) 70 - 99 mg/dL  Glucose, capillary  Result Value Ref Range   Glucose-Capillary 200 (H) 70 - 99 mg/dL  CBG monitoring, ED  Result Value Ref Range   Glucose-Capillary 277 (H) 70 - 99 mg/dL   Comment 1 Notify RN    Comment 2 Document in Chart       Assessment & Plan:   Problem List Items Addressed This Visit       Endocrine   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  Discussed with patient the importance of taking them to help get his diabetes under control.  Patient does not want to take oral medications but does agree to restart Trulicity.       Relevant Medications   Dulaglutide (TRULICITY) [id number] SOPN     Other   S/P BKA (below knee amputation) unilateral, left (HCC)    Orders placed for patient to get new sleeves and liner for his prosthetic.  Patient needs a new prosthetic due to weight loss.  Will send prescription to [facility] in [place].       Other Visit Diagnoses     Low testosterone in male    -  Primary   Patient would like to have  testosterone done. Order placed for lab to be drawn first thing in the morning. Explained to patient the level is more accurate.   Relevant Orders   Testosterone, free, total([facility])        Follow up plan: Return in about 2 months (around 02/16/2021) for HTN, HLD, DM2 FU.  A total of 30 minutes were spent on this encounter today.  When total time is documented, this includes both the face-to-face and non-face-to-face time personally spent before, during and after the visit on the date of the encounter discussing new orders for prosthetic and importance of diabetic medications.

## 2020-12-18 NOTE — Addendum Note (Signed)
Addended by: Jon Billings on: 12/18/2020 09:25 AM   Modules accepted: Orders

## 2020-12-18 NOTE — Assessment & Plan Note (Signed)
Orders placed for patient to get new sleeves and liner for his prosthetic.  Patient needs a new prosthetic due to weight loss.  Will send prescription to [facility] in [place].

## 2020-12-18 NOTE — Assessment & Plan Note (Signed)
Patient has stopped taking all of his medications.  Discussed with patient the importance of taking them to help get his diabetes under control.  Patient does not want to take oral medications but does agree to restart Trulicity.

## 2020-12-29 ENCOUNTER — Ambulatory Visit: Payer: Medicaid Other | Admitting: Podiatry

## 2021-01-01 ENCOUNTER — Other Ambulatory Visit: Payer: Self-pay

## 2021-01-01 ENCOUNTER — Ambulatory Visit
Admission: RE | Admit: 2021-01-01 | Discharge: 2021-01-01 | Disposition: A | Discharge status: Home / Self Care | Payer: Medicaid Other | Source: Ambulatory Visit | Attending: Nurse Practitioner | Admitting: Nurse Practitioner

## 2021-01-01 DIAGNOSIS — M25811 Other specified joint disorders, right shoulder: Secondary | ICD-10-CM | POA: Insufficient documentation

## 2021-01-01 DIAGNOSIS — M25511 Pain in right shoulder: Secondary | ICD-10-CM | POA: Diagnosis not present

## 2021-01-04 NOTE — Progress Notes (Signed)
BP (!) 168/90   Pulse (!) 109   Ht 6\' 1"  (1.854 m)   Wt 261 lb (118.4 kg)   BMI 34.43 kg/m    Subjective:    Patient ID: [name] Amy., male    DOB: [birth year], 60 y.o.   MRN: [id number]  HPI: [name] Day. is a 60 y.o. male  Chief Complaint  Patient presents with   Results   Patient presents to clinic to discuss imaging results.  Patient states he wants to know what to do next.    Discussed with patient that his blood pressure is elevated.  He states he does not want to take medication for his blood pressure.  He has had some recent stress in his life which has caused his blood pressure to be elevated.  Denies HA, CP, SOB, dizziness, palpitations, visual changes, and lower extremity swelling.    Relevant past medical, surgical, family and social history reviewed and updated as indicated. Interim medical history since our last visit reviewed. Allergies and medications reviewed and updated.  Review of Systems  Eyes:  Negative for visual disturbance.  Respiratory:  Negative for shortness of breath.   Cardiovascular:  Negative for chest pain and leg swelling.  Neurological:  Negative for light-headedness and headaches.   Per HPI unless specifically indicated above     Objective:    BP (!) 168/90   Pulse (!) 109   Ht 6\' 1"  (1.854 m)   Wt 261 lb (118.4 kg)   BMI 34.43 kg/m   Wt Readings from Last 3 Encounters:  01/05/21 261 lb (118.4 kg)  12/17/20 261 lb 12.8 oz (118.8 kg)  10/20/20 262 lb 5.6 oz (119 kg)    Physical Exam Vitals and nursing note reviewed.  Constitutional:      General: He is not in acute distress.    Appearance: Normal appearance. He is obese. He is not ill-appearing, toxic-appearing or diaphoretic.  HENT:     Head: Normocephalic.     Right Ear: External ear normal.     Left Ear: External ear normal.     Nose: Nose normal. No congestion or rhinorrhea.     Mouth/Throat:     Mouth: Mucous membranes are moist.  Eyes:      General:        Right eye: No discharge.        Left eye: No discharge.     Extraocular Movements: Extraocular movements intact.     Conjunctiva/sclera: Conjunctivae normal.     Pupils: Pupils are equal, round, and reactive to light.  Cardiovascular:     Rate and Rhythm: Normal rate and regular rhythm.     Heart sounds: No murmur heard. Pulmonary:     Effort: Pulmonary effort is normal. No respiratory distress.     Breath sounds: Normal breath sounds. No wheezing, rhonchi or rales.  Abdominal:     General: Abdomen is flat. Bowel sounds are normal.  Musculoskeletal:     Cervical back: Normal range of motion and neck supple.  Skin:    General: Skin is warm and dry.     Capillary Refill: Capillary refill takes less than 2 seconds.  Neurological:     General: No focal deficit present.     Mental Status: He is alert and oriented to person, place, and time.  Psychiatric:        Mood and Affect: Mood normal.        Behavior: Behavior normal.  Thought Content: Thought content normal.        Judgment: Judgment normal.    Results for orders placed or performed during the hospital encounter of 10/20/20  Resp Panel by RT-PCR (Flu A&B, Covid) Nasopharyngeal Swab   Specimen: Nasopharyngeal Swab; Nasopharyngeal(NP) swabs in vial transport medium  Result Value Ref Range   SARS Coronavirus 2 by RT PCR NEGATIVE NEGATIVE   Influenza A by PCR NEGATIVE NEGATIVE   Influenza B by PCR NEGATIVE NEGATIVE  Blood culture (routine x 2)   Specimen: BLOOD  Result Value Ref Range   Specimen Description BLOOD RIGHT ANTECUBITAL    Special Requests      BOTTLES DRAWN AEROBIC AND ANAEROBIC Blood Culture adequate volume   Culture      NO GROWTH 5 DAYS Performed at [facility], [address] [place], [place] [address]    Report Status 10/25/2020 FINAL   Blood culture (routine x 2)   Specimen: BLOOD  Result Value Ref Range   Specimen Description BLOOD LEFT ANTECUBITAL    Special  Requests      BOTTLES DRAWN AEROBIC AND ANAEROBIC Blood Culture adequate volume   Culture      NO GROWTH 5 DAYS Performed at [facility], [address] [place], [place] [address]    Report Status 10/25/2020 FINAL   CBC  Result Value Ref Range   WBC 13.5 (H) 4.0 - 10.5 K/uL   RBC 5.28 4.22 - 5.81 MIL/uL   Hemoglobin 15.7 13.0 - 17.0 g/dL   HCT 45.5 39.0 - 52.0 %   MCV 86.2 80.0 - 100.0 fL   MCH 29.7 26.0 - 34.0 pg   MCHC 34.5 30.0 - 36.0 g/dL   RDW 13.3 11.5 - 15.5 %   Platelets 248 150 - 400 K/uL   nRBC 0.0 0.0 - 0.2 %  Comprehensive metabolic panel  Result Value Ref Range   Sodium 134 (L) 135 - 145 mmol/L   Potassium 3.8 3.5 - 5.1 mmol/L   Chloride 97 (L) 98 - 111 mmol/L   CO2 29 22 - 32 mmol/L   Glucose, Bld 320 (H) 70 - 99 mg/dL   BUN 11 6 - 20 mg/dL   Creatinine, Ser 0.67 0.61 - 1.24 mg/dL   Calcium 9.4 8.9 - 10.3 mg/dL   Total Protein 7.7 6.5 - 8.1 g/dL   Albumin 4.1 3.5 - 5.0 g/dL   AST 24 15 - 41 U/L   ALT 21 0 - 44 U/L   Alkaline Phosphatase 69 38 - 126 U/L   Total Bilirubin 0.7 0.3 - 1.2 mg/dL   GFR, Estimated >60 >60 mL/min   Anion gap 8 5 - 15  Lactic acid, plasma  Result Value Ref Range   Lactic Acid, Venous 1.7 0.5 - 1.9 mmol/L  Sedimentation rate  Result Value Ref Range   Sed Rate 41 (H) 0 - 20 mm/hr  C-reactive protein  Result Value Ref Range   CRP 1.5 (H) <1.0 mg/dL  Hemoglobin A1c  Result Value Ref Range   Hgb A1c MFr Bld 10.9 (H) 4.8 - 5.6 %   Mean Plasma Glucose 266.13 mg/dL  Basic metabolic panel  Result Value Ref Range   Sodium 134 (L) 135 - 145 mmol/L   Potassium 3.6 3.5 - 5.1 mmol/L   Chloride 101 98 - 111 mmol/L   CO2 29 22 - 32 mmol/L   Glucose, Bld 185 (H) 70 - 99 mg/dL   BUN 10 6 - 20 mg/dL  Creatinine, Ser 0.55 (L) 0.61 - 1.24 mg/dL   Calcium 8.7 (L) 8.9 - 10.3 mg/dL   GFR, Estimated >60 >60 mL/min   Anion gap 4 (L) 5 - 15  CBC  Result Value Ref Range   WBC 12.0 (H) 4.0 - 10.5 K/uL   RBC 4.78 4.22 - 5.81 MIL/uL    Hemoglobin 14.1 13.0 - 17.0 g/dL   HCT 40.3 39.0 - 52.0 %   MCV 84.3 80.0 - 100.0 fL   MCH 29.5 26.0 - 34.0 pg   MCHC 35.0 30.0 - 36.0 g/dL   RDW 13.3 11.5 - 15.5 %   Platelets 221 150 - 400 K/uL   nRBC 0.0 0.0 - 0.2 %  Glucose, capillary  Result Value Ref Range   Glucose-Capillary 214 (H) 70 - 99 mg/dL  Glucose, capillary  Result Value Ref Range   Glucose-Capillary 225 (H) 70 - 99 mg/dL  Glucose, capillary  Result Value Ref Range   Glucose-Capillary 166 (H) 70 - 99 mg/dL  Glucose, capillary  Result Value Ref Range   Glucose-Capillary 193 (H) 70 - 99 mg/dL  Glucose, capillary  Result Value Ref Range   Glucose-Capillary 200 (H) 70 - 99 mg/dL  CBG monitoring, ED  Result Value Ref Range   Glucose-Capillary 277 (H) 70 - 99 mg/dL   Comment 1 Notify RN    Comment 2 Document in Chart       Assessment & Plan:   Problem List Items Addressed This Visit       Cardiovascular and Mediastinum   Hypertension associated with diabetes ([place])    Patient declines medication during visit today.  Discussed that his blood pressure is elevated and it is recommended to reduce risk of stroke and heart attack.  Also discussed with patient that I do not advise he drink energy drinks.        Other Visit Diagnoses     Encounter to discuss test results    -  Primary   Reviewed imaging results with patient during visit. All questions answered to the best of my ability.    Tendinitis of shoulder, unspecified laterality       Referral placed for patient to follow up with Orthopedics.   Relevant Orders   Ambulatory referral to Orthopedics        Follow up plan: Return if symptoms worsen or fail to improve.

## 2021-01-04 NOTE — Progress Notes (Signed)
Please let patient know that his MRI shows he has tendinosis in most of tendons in his shoulder.  Also, he has a possible tear in his bursa surface.  I recommend he see Orthopedics for further evaluation and treatment.  If he needs a referral I can place it for him.

## 2021-01-05 ENCOUNTER — Ambulatory Visit (INDEPENDENT_AMBULATORY_CARE_PROVIDER_SITE_OTHER): Payer: Medicaid Other | Admitting: Nurse Practitioner

## 2021-01-05 ENCOUNTER — Other Ambulatory Visit: Payer: Self-pay

## 2021-01-05 ENCOUNTER — Encounter: Payer: Self-pay | Admitting: Nurse Practitioner

## 2021-01-05 VITALS — BP 168/90 | HR 109 | Ht 73.0 in | Wt 261.0 lb

## 2021-01-05 DIAGNOSIS — E1159 Type 2 diabetes mellitus with other circulatory complications: Secondary | ICD-10-CM | POA: Diagnosis not present

## 2021-01-05 DIAGNOSIS — Z712 Person consulting for explanation of examination or test findings: Secondary | ICD-10-CM | POA: Diagnosis not present

## 2021-01-05 DIAGNOSIS — M778 Other enthesopathies, not elsewhere classified: Secondary | ICD-10-CM | POA: Diagnosis not present

## 2021-01-05 DIAGNOSIS — I152 Hypertension secondary to endocrine disorders: Secondary | ICD-10-CM

## 2021-01-05 NOTE — Assessment & Plan Note (Signed)
Patient declines medication during visit today.  Discussed that his blood pressure is elevated and it is recommended to reduce risk of stroke and heart attack.  Also discussed with patient that I do not advise he drink energy drinks.

## 2021-01-08 DIAGNOSIS — Z89512 Acquired absence of left leg below knee: Secondary | ICD-10-CM | POA: Diagnosis not present

## 2021-01-27 DIAGNOSIS — M75101 Unspecified rotator cuff tear or rupture of right shoulder, not specified as traumatic: Secondary | ICD-10-CM | POA: Diagnosis not present

## 2021-02-16 ENCOUNTER — Ambulatory Visit: Payer: Medicaid Other | Admitting: Nurse Practitioner

## 2021-02-16 NOTE — Progress Notes (Deleted)
There were no vitals taken for this visit.   Subjective:    Patient ID: [name] Amy., male    DOB: 12-Sep-1960, 60 y.o.   MRN: [id number]  HPI: [name] Hunsinger. is a 60 y.o. male  No chief complaint on file.  HYPERTENSION Hypertension status: {Blank single:19197::"controlled","uncontrolled","better","worse","exacerbated","stable"}  Satisfied with current treatment? {Blank single:19197::"yes","no"} Duration of hypertension: {Blank single:19197::"chronic","months","years"} BP monitoring frequency:  {Blank single:19197::"not checking","rarely","daily","weekly","monthly","a few times a day","a few times a week","a few times a month"} BP range:  BP medication side effects:  {Blank single:19197::"yes","no"} Medication compliance: {Blank single:19197::"excellent compliance","good compliance","fair compliance","poor compliance"} Previous BP meds:{Blank NKNLZJQB:34193::"XTKW","IOXBDZHGDJ","MEQASTMHDQ/QIWLNLGXQJ","JHERDEYC","XKGYJEHUDJ","SHFWYOVZCH/YIFO","YDXAJOINOM (bystolic)","carvedilol","chlorthalidone","clonidine","diltiazem","exforge HCT","HCTZ","irbesartan (avapro)","labetalol","lisinopril","lisinopril-HCTZ","losartan (cozaar)","methyldopa","nifedipine","olmesartan (benicar)","olmesartan-HCTZ","quinapril","ramipril","spironalactone","tekturna","valsartan","valsartan-HCTZ","verapamil"} Aspirin: {Blank single:19197::"yes","no"} Recurrent headaches: {Blank single:19197::"yes","no"} Visual changes: {Blank single:19197::"yes","no"} Palpitations: {Blank single:19197::"yes","no"} Dyspnea: {Blank single:19197::"yes","no"} Chest pain: {Blank single:19197::"yes","no"} Lower extremity edema: {Blank single:19197::"yes","no"} Dizzy/lightheaded: {Blank single:19197::"yes","no"}  DIABETES Hypoglycemic episodes:{Blank single:19197::"yes","no"} Polydipsia/polyuria: {Blank single:19197::"yes","no"} Visual disturbance: {Blank single:19197::"yes","no"} Chest pain: {Blank  single:19197::"yes","no"} Paresthesias: {Blank single:19197::"yes","no"} Glucose Monitoring: {Blank single:19197::"yes","no"}  Accucheck frequency: {Blank single:19197::"Not Checking","Daily","BID","TID"}  Fasting glucose:  Post prandial:  Evening:  Before meals: Taking Insulin?: {Blank single:19197::"yes","no"}  Long acting insulin:  Short acting insulin: Blood Pressure Monitoring: {Blank single:19197::"not checking","rarely","daily","weekly","monthly","a few times a day","a few times a week","a few times a month"} Retinal Examination: {Blank single:19197::"Up to Date","Not up to Date"} Foot Exam: {Blank single:19197::"Up to Date","Not up to Date"} Diabetic Education: {Blank single:19197::"Completed","Not Completed"} Pneumovax: {Blank single:19197::"Up to Date","Not up to Date","unknown"} Influenza: {Blank single:19197::"Up to Date","Not up to Date","unknown"} Aspirin: {Blank single:19197::"yes","no"}  Relevant past medical, surgical, family and social history reviewed and updated as indicated. Interim medical history since our last visit reviewed. Allergies and medications reviewed and updated.  Review of Systems  Per HPI unless specifically indicated above     Objective:    There were no vitals taken for this visit.  Wt Readings from Last 3 Encounters:  01/05/21 261 lb (118.4 kg)  12/17/20 261 lb 12.8 oz (118.8 kg)  10/20/20 262 lb 5.6 oz (119 kg)    Physical Exam  Results for orders placed or performed during the hospital encounter of 10/20/20  Resp Panel by RT-PCR (Flu A&B, Covid) Nasopharyngeal Swab   Specimen: Nasopharyngeal Swab; Nasopharyngeal(NP) swabs in vial transport medium  Result Value Ref Range   SARS Coronavirus 2 by RT PCR NEGATIVE NEGATIVE   Influenza A by PCR NEGATIVE NEGATIVE   Influenza B by PCR NEGATIVE NEGATIVE  Blood culture (routine x 2)   Specimen: BLOOD  Result Value Ref Range   Specimen Description BLOOD RIGHT ANTECUBITAL    Special  Requests      BOTTLES DRAWN AEROBIC AND ANAEROBIC Blood Culture adequate volume   Culture      NO GROWTH 5 DAYS Performed at [facility], [address] [place], [place] [address]    Report Status 10/25/2020 FINAL   Blood culture (routine x 2)   Specimen: BLOOD  Result Value Ref Range   Specimen Description BLOOD LEFT ANTECUBITAL    Special Requests      BOTTLES DRAWN AEROBIC AND ANAEROBIC Blood Culture adequate volume   Culture      NO GROWTH 5 DAYS Performed at [facility], [address] [place], [place] [address]    Report Status 10/25/2020 FINAL   CBC  Result Value Ref Range   WBC 13.5 (H) 4.0 - 10.5 K/uL   RBC 5.28 4.22 - 5.81 MIL/uL   Hemoglobin 15.7 13.0 - 17.0 g/dL   HCT 45.5 39.0 - 52.0 %   MCV  86.2 80.0 - 100.0 fL   MCH 29.7 26.0 - 34.0 pg   MCHC 34.5 30.0 - 36.0 g/dL   RDW 13.3 11.5 - 15.5 %   Platelets 248 150 - 400 K/uL   nRBC 0.0 0.0 - 0.2 %  Comprehensive metabolic panel  Result Value Ref Range   Sodium 134 (L) 135 - 145 mmol/L   Potassium 3.8 3.5 - 5.1 mmol/L   Chloride 97 (L) 98 - 111 mmol/L   CO2 29 22 - 32 mmol/L   Glucose, Bld 320 (H) 70 - 99 mg/dL   BUN 11 6 - 20 mg/dL   Creatinine, Ser 0.67 0.61 - 1.24 mg/dL   Calcium 9.4 8.9 - 10.3 mg/dL   Total Protein 7.7 6.5 - 8.1 g/dL   Albumin 4.1 3.5 - 5.0 g/dL   AST 24 15 - 41 U/L   ALT 21 0 - 44 U/L   Alkaline Phosphatase 69 38 - 126 U/L   Total Bilirubin 0.7 0.3 - 1.2 mg/dL   GFR, Estimated >60 >60 mL/min   Anion gap 8 5 - 15  Lactic acid, plasma  Result Value Ref Range   Lactic Acid, Venous 1.7 0.5 - 1.9 mmol/L  Sedimentation rate  Result Value Ref Range   Sed Rate 41 (H) 0 - 20 mm/hr  C-reactive protein  Result Value Ref Range   CRP 1.5 (H) <1.0 mg/dL  Hemoglobin A1c  Result Value Ref Range   Hgb A1c MFr Bld 10.9 (H) 4.8 - 5.6 %   Mean Plasma Glucose 266.13 mg/dL  Basic metabolic panel  Result Value Ref Range   Sodium 134 (L) 135 - 145 mmol/L   Potassium 3.6 3.5  - 5.1 mmol/L   Chloride 101 98 - 111 mmol/L   CO2 29 22 - 32 mmol/L   Glucose, Bld 185 (H) 70 - 99 mg/dL   BUN 10 6 - 20 mg/dL   Creatinine, Ser 0.55 (L) 0.61 - 1.24 mg/dL   Calcium 8.7 (L) 8.9 - 10.3 mg/dL   GFR, Estimated >60 >60 mL/min   Anion gap 4 (L) 5 - 15  CBC  Result Value Ref Range   WBC 12.0 (H) 4.0 - 10.5 K/uL   RBC 4.78 4.22 - 5.81 MIL/uL   Hemoglobin 14.1 13.0 - 17.0 g/dL   HCT 40.3 39.0 - 52.0 %   MCV 84.3 80.0 - 100.0 fL   MCH 29.5 26.0 - 34.0 pg   MCHC 35.0 30.0 - 36.0 g/dL   RDW 13.3 11.5 - 15.5 %   Platelets 221 150 - 400 K/uL   nRBC 0.0 0.0 - 0.2 %  Glucose, capillary  Result Value Ref Range   Glucose-Capillary 214 (H) 70 - 99 mg/dL  Glucose, capillary  Result Value Ref Range   Glucose-Capillary 225 (H) 70 - 99 mg/dL  Glucose, capillary  Result Value Ref Range   Glucose-Capillary 166 (H) 70 - 99 mg/dL  Glucose, capillary  Result Value Ref Range   Glucose-Capillary 193 (H) 70 - 99 mg/dL  Glucose, capillary  Result Value Ref Range   Glucose-Capillary 200 (H) 70 - 99 mg/dL  CBG monitoring, ED  Result Value Ref Range   Glucose-Capillary 277 (H) 70 - 99 mg/dL   Comment 1 Notify RN    Comment 2 Document in Chart       Assessment & Plan:   Problem List Items Addressed This Visit       Cardiovascular and Mediastinum   Hypertension associated with  diabetes ([name]) - Primary     Endocrine   Type 2 diabetes mellitus with proteinuria (HCC)   Type 2 diabetes mellitus with foot ulcer (HCC)     Nervous and Auditory   Nicotine dependence, cigarettes, w unsp disorders     Follow up plan: No follow-ups on file.

## 2021-07-25 NOTE — Progress Notes (Deleted)
? ?There were no vitals taken for this visit.  ? ?Subjective:  ? ? Patient ID: [name] Amy., male    DOB: 06-04-1960, 61 y.o.   MRN: [id number] ? ?HPI: ?[name] Kaspar. is a 61 y.o. male ? ?No chief complaint on file. ? ?FOOT PAIN ?Duration: {Blank single:19197::"chronic","days","weeks","months"} ?Involved foot: {Blank single:19197::"left","right","bilateral"} ?Mechanism of injury: {Blank single:19197::"trauma","unknown"} ?Location:  ?Onset: {Blank single:19197::"sudden","gradual"}  ?Severity: {Blank single:19197::"mild","moderate","severe","1/10","2/10","3/10","4/10","5/10","6/10","7/10","8/10","9/10","10/10"}  ?Quality:  {Blank multiple:19196::"sharp","dull","aching","burning","cramping","ill-defined","itchy","pressure-like","pulling","shooting","sore","stabbing","tender","tearing","throbbing"} ?Frequency: {Blank single:19197::"constant","intermittent","occasional","rare","every few minutes","a few times a hour","a few times a day","a few times a week","a few times a month","a few times a year"} ?Radiation: {Blank single:19197::"yes","no"} ?Aggravating factors: {Blank multiple:19196::"weight bearing","walking","running","stairs","bending","movement","prolonged sitting"}  ?Alleviating factors: {Blank multiple:19196::"nothing","ice","physical therapy","HEP","APAP","NSAIDs","brace","crutches","rest"}  ?Status: {Blank multiple:19196::"better","worse","stable","fluctuating"} ?Treatments attempted: {Blank multiple:19196::"none","rest","ice","heat","APAP","ibuprofen","aleve","physical therapy","HEP"}  ?Relief with NSAIDs?:  {Blank single:19197::"No NSAIDs Taken","no","mild","moderate","significant"} ?Weakness with weight bearing or walking: {Blank single:19197::"yes","no"} ?Morning stiffness: {Blank single:19197::"yes","no"} ?Swelling: {Blank single:19197::"yes","no"} ?Redness: {Blank single:19197::"yes","no"} ?Bruising: {Blank single:19197::"yes","no"} ?Paresthesias / decreased sensation: {Blank  single:19197::"yes","no"}  ?Fevers:{Blank single:19197::"yes","no"} ? ?Relevant past medical, surgical, family and social history reviewed and updated as indicated. Interim medical history since our last visit reviewed. ?Allergies and medications reviewed and updated. ? ?Review of Systems ? ?Per HPI unless specifically indicated above ? ?   ?Objective:  ?  ?There were no vitals taken for this visit.  ?Wt Readings from Last 3 Encounters:  ?01/05/21 261 lb (118.4 kg)  ?12/17/20 261 lb 12.8 oz (118.8 kg)  ?10/20/20 262 lb 5.6 oz (119 kg)  ?  ?Physical Exam ? ?Results for orders placed or performed during the hospital encounter of 10/20/20  ?Resp Panel by RT-PCR (Flu A&B, Covid) Nasopharyngeal Swab  ? Specimen: Nasopharyngeal Swab; Nasopharyngeal(NP) swabs in vial transport medium  ?Result Value Ref Range  ? SARS Coronavirus 2 by RT PCR NEGATIVE NEGATIVE  ? Influenza A by PCR NEGATIVE NEGATIVE  ? Influenza B by PCR NEGATIVE NEGATIVE  ?Blood culture (routine x 2)  ? Specimen: BLOOD  ?Result Value Ref Range  ? Specimen Description BLOOD RIGHT ANTECUBITAL   ? Special Requests    ?  BOTTLES DRAWN AEROBIC AND ANAEROBIC Blood Culture adequate volume  ? Culture    ?  NO GROWTH 5 DAYS ?Performed at [facility], [address] [place], [place] [address] ?  ? Report Status 10/25/2020 FINAL   ?Blood culture (routine x 2)  ? Specimen: BLOOD  ?Result Value Ref Range  ? Specimen Description BLOOD LEFT ANTECUBITAL   ? Special Requests    ?  BOTTLES DRAWN AEROBIC AND ANAEROBIC Blood Culture adequate volume  ? Culture    ?  NO GROWTH 5 DAYS ?Performed at [facility], [address], [place],  [address] ?  ? Report Status 10/25/2020 FINAL   ?CBC  ?Result Value Ref Range  ? WBC 13.5 (H) 4.0 - 10.5 K/uL  ? RBC 5.28 4.22 - 5.81 MIL/uL  ? Hemoglobin 15.7 13.0 - 17.0 g/dL  ? HCT 45.5 39.0 - 52.0 %  ? MCV 86.2 80.0 - 100.0 fL  ? MCH 29.7 26.0 - 34.0 pg  ? MCHC 34.5 30.0 - 36.0 g/dL  ? RDW 13.3 11.5 - 15.5 %  ?  Platelets 248 150 - 400 K/uL  ? nRBC 0.0 0.0 - 0.2 %  ?Comprehensive metabolic panel  ?Result Value Ref Range  ? Sodium 134 (L) 135 - 145 mmol/L  ? Potassium 3.8 3.5 - 5.1 mmol/L  ? Chloride 97 (L) 98 - 111 mmol/L  ? CO2 29 22 - 32 mmol/L  ?  Glucose, Bld 320 (H) 70 - 99 mg/dL  ? BUN 11 6 - 20 mg/dL  ? Creatinine, Ser 0.67 0.61 - 1.24 mg/dL  ? Calcium 9.4 8.9 - 10.3 mg/dL  ? Total Protein 7.7 6.5 - 8.1 g/dL  ? Albumin 4.1 3.5 - 5.0 g/dL  ? AST 24 15 - 41 U/L  ? ALT 21 0 - 44 U/L  ? Alkaline Phosphatase 69 38 - 126 U/L  ? Total Bilirubin 0.7 0.3 - 1.2 mg/dL  ? GFR, Estimated >60 >60 mL/min  ? Anion gap 8 5 - 15  ?Lactic acid, plasma  ?Result Value Ref Range  ? Lactic Acid, Venous 1.7 0.5 - 1.9 mmol/L  ?Sedimentation rate  ?Result Value Ref Range  ? Sed Rate 41 (H) 0 - 20 mm/hr  ?C-reactive protein  ?Result Value Ref Range  ? CRP 1.5 (H) <1.0 mg/dL  ?Hemoglobin A1c  ?Result Value Ref Range  ? Hgb A1c MFr Bld 10.9 (H) 4.8 - 5.6 %  ? Mean Plasma Glucose 266.13 mg/dL  ?Basic metabolic panel  ?Result Value Ref Range  ? Sodium 134 (L) 135 - 145 mmol/L  ? Potassium 3.6 3.5 - 5.1 mmol/L  ? Chloride 101 98 - 111 mmol/L  ? CO2 29 22 - 32 mmol/L  ? Glucose, Bld 185 (H) 70 - 99 mg/dL  ? BUN 10 6 - 20 mg/dL  ? Creatinine, Ser 0.55 (L) 0.61 - 1.24 mg/dL  ? Calcium 8.7 (L) 8.9 - 10.3 mg/dL  ? GFR, Estimated >60 >60 mL/min  ? Anion gap 4 (L) 5 - 15  ?CBC  ?Result Value Ref Range  ? WBC 12.0 (H) 4.0 - 10.5 K/uL  ? RBC 4.78 4.22 - 5.81 MIL/uL  ? Hemoglobin 14.1 13.0 - 17.0 g/dL  ? HCT 40.3 39.0 - 52.0 %  ? MCV 84.3 80.0 - 100.0 fL  ? MCH 29.5 26.0 - 34.0 pg  ? MCHC 35.0 30.0 - 36.0 g/dL  ? RDW 13.3 11.5 - 15.5 %  ? Platelets 221 150 - 400 K/uL  ? nRBC 0.0 0.0 - 0.2 %  ?Glucose, capillary  ?Result Value Ref Range  ? Glucose-Capillary 214 (H) 70 - 99 mg/dL  ?Glucose, capillary  ?Result Value Ref Range  ? Glucose-Capillary 225 (H) 70 - 99 mg/dL  ?Glucose, capillary  ?Result Value Ref Range  ? Glucose-Capillary 166 (H) 70 - 99 mg/dL   ?Glucose, capillary  ?Result Value Ref Range  ? Glucose-Capillary 193 (H) 70 - 99 mg/dL  ?Glucose, capillary  ?Result Value Ref Range  ? Glucose-Capillary 200 (H) 70 - 99 mg/dL  ?CBG monitoring, ED  ?Result Value Ref Range  ? Glucose-Capillary 277 (H) 70 - 99 mg/dL  ? Comment 1 Notify RN   ? Comment 2 Document in Chart   ? ?   ?Assessment & Plan:  ? ?Problem List Items Addressed This Visit   ?None ?  ? ?Follow up plan: ?No follow-ups on file. ? ? ? ? ? ?

## 2021-07-26 ENCOUNTER — Ambulatory Visit: Payer: Medicaid Other | Admitting: Nurse Practitioner

## 2021-09-24 ENCOUNTER — Ambulatory Visit: Payer: Medicaid Other | Admitting: Nurse Practitioner

## 2021-10-04 ENCOUNTER — Ambulatory Visit: Payer: Medicaid Other | Admitting: Nurse Practitioner

## 2021-10-04 NOTE — Progress Notes (Deleted)
There were no vitals taken for this visit.   Subjective:    Patient ID: [name] Amy., male    DOB: November 06, 1960, 61 y.o.   MRN: [id number]  HPI: [name] Bonn. is a 61 y.o. male  No chief complaint on file.  DIABETES Patient has restarted his Metformin.  Hypoglycemic episodes:no Polydipsia/polyuria: yes Visual disturbance: no Chest pain: no Paresthesias: yes Glucose Monitoring: no  Accucheck frequency: Not Checking  Fasting glucose:  Post prandial:  Evening:  Before meals: Taking Insulin?: no  Long acting insulin:  Short acting insulin: Blood Pressure Monitoring: not checking Retinal Examination: Not up to Date Foot Exam: Up to Date Diabetic Education: Not Completed Pneumovax: Not up to Date Influenza: Not up to Date Aspirin: no  HYPERTENSION / HYPERLIPIDEMIA Satisfied with current treatment? no Duration of hypertension: years BP monitoring frequency: not checking BP range:  BP medication side effects: no Past BP meds: none Duration of hyperlipidemia: years Cholesterol medication side effects: no Cholesterol supplements: none Past cholesterol medications: none Medication compliance: poor compliance Aspirin: no Recent stressors: no Recurrent headaches: no Visual changes: yes Palpitations: no Dyspnea: yes Chest pain: no Lower extremity edema: no Dizzy/lightheaded: no  CHRONIC PAIN Patient states he hurts in shoulders and hands. 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   Relevant past medical, surgical, family and social history reviewed and updated as indicated. Interim medical history since our last visit reviewed. Allergies and medications reviewed and updated.  Review of Systems  Eyes:  Positive for visual disturbance.  Respiratory:  Positive for shortness of breath.  Negative for chest tightness.   Cardiovascular:  Negative for chest pain, palpitations and leg swelling.  Endocrine: Positive for polydipsia and polyuria.  Neurological:  Positive for numbness. Negative for dizziness, light-headedness and headaches.    Per HPI unless specifically indicated above     Objective:    There were no vitals taken for this visit.  Wt Readings from Last 3 Encounters:  01/05/21 261 lb (118.4 kg)  12/17/20 261 lb 12.8 oz (118.8 kg)  10/20/20 262 lb 5.6 oz (119 kg)    Physical Exam Vitals and nursing note reviewed.  Constitutional:      General: He is not in acute distress.    Appearance: Normal appearance. He is not ill-appearing, toxic-appearing or diaphoretic.  HENT:     Head: Normocephalic.     Right Ear: External ear normal.     Left Ear: External ear normal.     Nose: Nose normal. No congestion or rhinorrhea.     Mouth/Throat:     Mouth: Mucous membranes are moist.  Eyes:     General:        Right eye: No discharge.        Left eye: No discharge.     Extraocular Movements: Extraocular movements intact.     Conjunctiva/sclera: Conjunctivae normal.     Pupils: Pupils are equal, round, and reactive to light.  Cardiovascular:     Rate and Rhythm: Regular rhythm. Tachycardia present.     Heart sounds: No murmur heard. Pulmonary:     Effort: Pulmonary effort is normal. No respiratory distress.     Breath sounds: Wheezing present. No rhonchi or rales.  Abdominal:     General: Abdomen is flat. Bowel  sounds are normal.  Musculoskeletal:        General: No swelling or tenderness.     Cervical back: Normal range of motion and neck supple.     Comments: Limited shoulder ROM bilaterally.  Skin:    General: Skin is warm and dry.     Capillary Refill: Capillary refill takes less than 2 seconds.  Neurological:     General: No focal deficit present.     Mental Status: He is alert and oriented to person, place, and time.  Psychiatric:        Mood and  Affect: Mood normal.        Behavior: Behavior normal.        Thought Content: Thought content normal.        Judgment: Judgment normal.    Results for orders placed or performed during the hospital encounter of 10/20/20  Resp Panel by RT-PCR (Flu A&B, Covid) Nasopharyngeal Swab   Specimen: Nasopharyngeal Swab; Nasopharyngeal(NP) swabs in vial transport medium  Result Value Ref Range   SARS Coronavirus 2 by RT PCR NEGATIVE NEGATIVE   Influenza A by PCR NEGATIVE NEGATIVE   Influenza B by PCR NEGATIVE NEGATIVE  Blood culture (routine x 2)   Specimen: BLOOD  Result Value Ref Range   Specimen Description BLOOD RIGHT ANTECUBITAL    Special Requests      BOTTLES DRAWN AEROBIC AND ANAEROBIC Blood Culture adequate volume   Culture      NO GROWTH 5 DAYS Performed at [facility], [address] [place], [place] [address]    Report Status 10/25/2020 FINAL   Blood culture (routine x 2)   Specimen: BLOOD  Result Value Ref Range   Specimen Description BLOOD LEFT ANTECUBITAL    Special Requests      BOTTLES DRAWN AEROBIC AND ANAEROBIC Blood Culture adequate volume   Culture      NO GROWTH 5 DAYS Performed at [facility], [address] [place],  [address]    Report Status 10/25/2020 FINAL   CBC  Result Value Ref Range   WBC 13.5 (H) 4.0 - 10.5 K/uL   RBC 5.28 4.22 - 5.81 MIL/uL   Hemoglobin 15.7 13.0 - 17.0 g/dL   HCT 45.5 39.0 - 52.0 %   MCV 86.2 80.0 - 100.0 fL   MCH 29.7 26.0 - 34.0 pg   MCHC 34.5 30.0 - 36.0 g/dL   RDW 13.3 11.5 - 15.5 %   Platelets 248 150 - 400 K/uL   nRBC 0.0 0.0 - 0.2 %  Comprehensive metabolic panel  Result Value Ref Range   Sodium 134 (L) 135 - 145 mmol/L   Potassium 3.8 3.5 - 5.1 mmol/L   Chloride 97 (L) 98 - 111 mmol/L   CO2 29 22 - 32 mmol/L   Glucose, Bld 320 (H) 70 - 99 mg/dL   BUN 11 6 - 20 mg/dL   Creatinine, Ser 0.67 0.61 - 1.24 mg/dL   Calcium 9.4 8.9 - 10.3 mg/dL   Total Protein 7.7 6.5 - 8.1 g/dL   Albumin  4.1 3.5 - 5.0 g/dL   AST 24 15 - 41 U/L   ALT 21 0 - 44 U/L   Alkaline Phosphatase 69 38 - 126 U/L   Total Bilirubin 0.7 0.3 - 1.2 mg/dL   GFR, Estimated >60 >60 mL/min   Anion gap 8 5 - 15  Lactic acid, plasma  Result Value Ref Range   Lactic Acid, Venous 1.7 0.5 -  1.9 mmol/L  Sedimentation rate  Result Value Ref Range   Sed Rate 41 (H) 0 - 20 mm/hr  C-reactive protein  Result Value Ref Range   CRP 1.5 (H) <1.0 mg/dL  Hemoglobin A1c  Result Value Ref Range   Hgb A1c MFr Bld 10.9 (H) 4.8 - 5.6 %   Mean Plasma Glucose 266.13 mg/dL  Basic metabolic panel  Result Value Ref Range   Sodium 134 (L) 135 - 145 mmol/L   Potassium 3.6 3.5 - 5.1 mmol/L   Chloride 101 98 - 111 mmol/L   CO2 29 22 - 32 mmol/L   Glucose, Bld 185 (H) 70 - 99 mg/dL   BUN 10 6 - 20 mg/dL   Creatinine, Ser 0.55 (L) 0.61 - 1.24 mg/dL   Calcium 8.7 (L) 8.9 - 10.3 mg/dL   GFR, Estimated >60 >60 mL/min   Anion gap 4 (L) 5 - 15  CBC  Result Value Ref Range   WBC 12.0 (H) 4.0 - 10.5 K/uL   RBC 4.78 4.22 - 5.81 MIL/uL   Hemoglobin 14.1 13.0 - 17.0 g/dL   HCT 40.3 39.0 - 52.0 %   MCV 84.3 80.0 - 100.0 fL   MCH 29.5 26.0 - 34.0 pg   MCHC 35.0 30.0 - 36.0 g/dL   RDW 13.3 11.5 - 15.5 %   Platelets 221 150 - 400 K/uL   nRBC 0.0 0.0 - 0.2 %  Glucose, capillary  Result Value Ref Range   Glucose-Capillary 214 (H) 70 - 99 mg/dL  Glucose, capillary  Result Value Ref Range   Glucose-Capillary 225 (H) 70 - 99 mg/dL  Glucose, capillary  Result Value Ref Range   Glucose-Capillary 166 (H) 70 - 99 mg/dL  Glucose, capillary  Result Value Ref Range   Glucose-Capillary 193 (H) 70 - 99 mg/dL  Glucose, capillary  Result Value Ref Range   Glucose-Capillary 200 (H) 70 - 99 mg/dL  CBG monitoring, ED  Result Value Ref Range   Glucose-Capillary 277 (H) 70 - 99 mg/dL   Comment 1 Notify RN    Comment 2 Document in Chart       Assessment & Plan:   Problem List Items Addressed This Visit      Cardiovascular and  Mediastinum   Hypertension associated with diabetes ([place]) - Primary     Endocrine   Type 2 diabetes mellitus with proteinuria (HCC)   Type 2 diabetes mellitus with foot ulcer (HCC)     Other   S/P BKA (below knee amputation) unilateral, left (HCC)   Chronic pain syndrome   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HCC)   Hypercholesteremia     Follow up plan: No follow-ups on file.

## 2021-10-05 NOTE — Progress Notes (Signed)
BP (!) 166/98   Pulse 94   Temp 97.6 F (36.4 C) (Oral)   Wt 263 lb 12.8 oz (119.7 kg)   SpO2 97%   BMI 34.80 kg/m    Subjective:    Patient ID: [name] Day., male    DOB: 14-Oct-1960, 61 y.o.   MRN: [id number]  HPI: Malique Driskill. is a 61 y.o. male  Chief Complaint  Patient presents with   Foot Pain    R foot pain states he has a piece of broken bone- seems like it wants to poke through skin? Pt states he was told there might be surgery needed for it.    DIABETES Patient only taking his Metformin.   Hypoglycemic episodes:no Polydipsia/polyuria: yes Visual disturbance: no Chest pain: no Paresthesias: yes Glucose Monitoring: no  Accucheck frequency: Not Checking  Fasting glucose:  Post prandial:  Evening:  Before meals: Taking Insulin?: no  Long acting insulin:  Short acting insulin: Blood Pressure Monitoring: not checking Retinal Examination: Not up to Date Foot Exam: Up to Date Diabetic Education: Not Completed Pneumovax: Not up to Date Influenza: Not up to Date Aspirin: no Patient needs to go back and see the podiatrist.  He states the place on his foot likely needs debridement.   HYPERTENSION / HYPERLIPIDEMIA Patient states he thinks it is related to his back pain that is slowing him down.   Satisfied with current treatment? no Duration of hypertension: years BP monitoring frequency: not checking BP range:  BP medication side effects: no Past BP meds: none Duration of hyperlipidemia: years Cholesterol medication side effects: no Cholesterol supplements: none Past cholesterol medications: none Medication compliance: poor compliance- not taking any medications.  Aspirin: no Recent stressors: no Recurrent headaches: no Visual changes: no Palpitations: no Dyspnea: yes Chest pain: no Lower extremity edema: no Dizzy/lightheaded: no  CHRONIC PAIN Patient states he hurt his back recently.  He has been having trouble sleeping.  He  states the gabapentin was helping his pain when he was taking it.  He would like to restart it.   Relevant past medical, surgical, family and social history reviewed and updated as indicated. Interim medical history since our last visit reviewed. Allergies and medications reviewed and updated.  Review of Systems  Eyes:  Negative for visual disturbance.  Respiratory:  Negative for chest tightness and shortness of breath.   Cardiovascular:  Negative for chest pain, palpitations and leg swelling.  Endocrine: Positive for polydipsia and polyuria.  Musculoskeletal:  Positive for back pain.  Neurological:  Positive for numbness. Negative for dizziness, light-headedness and headaches.    Per HPI unless specifically indicated above     Objective:    BP (!) 166/98   Pulse 94   Temp 97.6 F (36.4 C) (Oral)   Wt 263 lb 12.8 oz (119.7 kg)   SpO2 97%   BMI 34.80 kg/m   Wt Readings from Last 3 Encounters:  10/06/21 263 lb 12.8 oz (119.7 kg)  01/05/21 261 lb (118.4 kg)  12/17/20 261 lb 12.8 oz (118.8 kg)    Physical Exam Vitals and nursing note reviewed.  Constitutional:      General: He is not in acute distress.    Appearance: Normal appearance. He is not ill-appearing, toxic-appearing or diaphoretic.  HENT:     Head: Normocephalic.     Right Ear: External ear normal.     Left Ear: External ear normal.     Nose: Nose normal. No congestion or rhinorrhea.  Mouth/Throat:     Mouth: Mucous membranes are moist.  Eyes:     General:        Right eye: No discharge.        Left eye: No discharge.     Extraocular Movements: Extraocular movements intact.     Conjunctiva/sclera: Conjunctivae normal.     Pupils: Pupils are equal, round, and reactive to light.  Cardiovascular:     Rate and Rhythm: Regular rhythm. Tachycardia present.     Heart sounds: No murmur heard. Pulmonary:     Effort: Pulmonary effort is normal. No respiratory distress.     Breath sounds: No wheezing, rhonchi or  rales.  Abdominal:     General: Abdomen is flat. Bowel sounds are normal.  Musculoskeletal:        General: No swelling or tenderness.     Cervical back: Normal range of motion and neck supple.     Comments: Limited shoulder ROM bilaterally.  Skin:    General: Skin is warm and dry.     Capillary Refill: Capillary refill takes less than 2 seconds.  Neurological:     General: No focal deficit present.     Mental Status: He is alert and oriented to person, place, and time.  Psychiatric:        Mood and Affect: Mood normal.        Behavior: Behavior normal.        Thought Content: Thought content normal.        Judgment: Judgment normal.     Results for orders placed or performed during the hospital encounter of 10/20/20  Resp Panel by RT-PCR (Flu A&B, Covid) Nasopharyngeal Swab   Specimen: Nasopharyngeal Swab; Nasopharyngeal(NP) swabs in vial transport medium  Result Value Ref Range   SARS Coronavirus 2 by RT PCR NEGATIVE NEGATIVE   Influenza A by PCR NEGATIVE NEGATIVE   Influenza B by PCR NEGATIVE NEGATIVE  Blood culture (routine x 2)   Specimen: BLOOD  Result Value Ref Range   Specimen Description BLOOD RIGHT ANTECUBITAL    Special Requests      BOTTLES DRAWN AEROBIC AND ANAEROBIC Blood Culture adequate volume   Culture      NO GROWTH 5 DAYS Performed at [facility], [address] [place], [place] [address]    Report Status 10/25/2020 FINAL   Blood culture (routine x 2)   Specimen: BLOOD  Result Value Ref Range   Specimen Description BLOOD LEFT ANTECUBITAL    Special Requests      BOTTLES DRAWN AEROBIC AND ANAEROBIC Blood Culture adequate volume   Culture      NO GROWTH 5 DAYS Performed at [facility], [address] [place], [place] [address]    Report Status 10/25/2020 FINAL   CBC  Result Value Ref Range   WBC 13.5 (H) 4.0 - 10.5 K/uL   RBC 5.28 4.22 - 5.81 MIL/uL   Hemoglobin 15.7 13.0 - 17.0 g/dL   HCT 45.5 39.0 - 52.0 %   MCV 86.2  80.0 - 100.0 fL   MCH 29.7 26.0 - 34.0 pg   MCHC 34.5 30.0 - 36.0 g/dL   RDW 13.3 11.5 - 15.5 %   Platelets 248 150 - 400 K/uL   nRBC 0.0 0.0 - 0.2 %  Comprehensive metabolic panel  Result Value Ref Range   Sodium 134 (L) 135 - 145 mmol/L   Potassium 3.8 3.5 - 5.1 mmol/L   Chloride 97 (L) 98 - 111 mmol/L  CO2 29 22 - 32 mmol/L   Glucose, Bld 320 (H) 70 - 99 mg/dL   BUN 11 6 - 20 mg/dL   Creatinine, Ser 0.67 0.61 - 1.24 mg/dL   Calcium 9.4 8.9 - 10.3 mg/dL   Total Protein 7.7 6.5 - 8.1 g/dL   Albumin 4.1 3.5 - 5.0 g/dL   AST 24 15 - 41 U/L   ALT 21 0 - 44 U/L   Alkaline Phosphatase 69 38 - 126 U/L   Total Bilirubin 0.7 0.3 - 1.2 mg/dL   GFR, Estimated >60 >60 mL/min   Anion gap 8 5 - 15  Lactic acid, plasma  Result Value Ref Range   Lactic Acid, Venous 1.7 0.5 - 1.9 mmol/L  Sedimentation rate  Result Value Ref Range   Sed Rate 41 (H) 0 - 20 mm/hr  C-reactive protein  Result Value Ref Range   CRP 1.5 (H) <1.0 mg/dL  Hemoglobin A1c  Result Value Ref Range   Hgb A1c MFr Bld 10.9 (H) 4.8 - 5.6 %   Mean Plasma Glucose 266.13 mg/dL  Basic metabolic panel  Result Value Ref Range   Sodium 134 (L) 135 - 145 mmol/L   Potassium 3.6 3.5 - 5.1 mmol/L   Chloride 101 98 - 111 mmol/L   CO2 29 22 - 32 mmol/L   Glucose, Bld 185 (H) 70 - 99 mg/dL   BUN 10 6 - 20 mg/dL   Creatinine, Ser 0.55 (L) 0.61 - 1.24 mg/dL   Calcium 8.7 (L) 8.9 - 10.3 mg/dL   GFR, Estimated >60 >60 mL/min   Anion gap 4 (L) 5 - 15  CBC  Result Value Ref Range   WBC 12.0 (H) 4.0 - 10.5 K/uL   RBC 4.78 4.22 - 5.81 MIL/uL   Hemoglobin 14.1 13.0 - 17.0 g/dL   HCT 40.3 39.0 - 52.0 %   MCV 84.3 80.0 - 100.0 fL   MCH 29.5 26.0 - 34.0 pg   MCHC 35.0 30.0 - 36.0 g/dL   RDW 13.3 11.5 - 15.5 %   Platelets 221 150 - 400 K/uL   nRBC 0.0 0.0 - 0.2 %  Glucose, capillary  Result Value Ref Range   Glucose-Capillary 214 (H) 70 - 99 mg/dL  Glucose, capillary  Result Value Ref Range   Glucose-Capillary 225 (H) 70 - 99  mg/dL  Glucose, capillary  Result Value Ref Range   Glucose-Capillary 166 (H) 70 - 99 mg/dL  Glucose, capillary  Result Value Ref Range   Glucose-Capillary 193 (H) 70 - 99 mg/dL  Glucose, capillary  Result Value Ref Range   Glucose-Capillary 200 (H) 70 - 99 mg/dL  CBG monitoring, ED  Result Value Ref Range   Glucose-Capillary 277 (H) 70 - 99 mg/dL   Comment 1 Notify RN    Comment 2 Document in Chart       Assessment & Plan:   Problem List Items Addressed This Visit       Cardiovascular and Mediastinum   Hypertension associated with diabetes ([place]) - Primary    Chronic. Not well controlled.  Not currently taking any medication. Feels like it is related to his pain.  Will work to treat pain and follow up in 1 month for reevaluation.  Will re discuss medication at that time if needed.      Relevant Orders   Comp Met (CMET)     Endocrine   Type 2 diabetes mellitus with proteinuria (HCC)    Chronic. Currently taking Metformin  533m BID.  Agrees that if a1c is elevated to restart Jardiance.  Labs ordered today.  Would benefit from Statin and ACE/Arb. Will follow up in 1 month for reevaluation.      Relevant Orders   HgB A1c   Type 2 diabetes mellitus with foot ulcer ([place]    Recommend patient seeing Podiatry for further evaluation and management.         Other   Chronic pain syndrome    Chronic. Will restart Gabapentin BID.  Will follow up in 1 month to see how patient's pain is.      Relevant Medications   gabapentin (NEURONTIN) 600 MG tablet   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HCC)   Hypercholesteremia    Chronic. Not currently on medication.  Would benefit from statin.  Labs ordered today. Will make recommendations based on lab results.       Relevant Orders   Lipid Profile   Other Visit Diagnoses     Leukocytosis, unspecified type       Labs ordered today. Will make recommendations based on lab results.   Relevant Orders   CBC w/Diff         Follow up plan: Return in about 1 month (around 11/05/2021) for BP Check.

## 2021-10-06 ENCOUNTER — Encounter: Payer: Self-pay | Admitting: Nurse Practitioner

## 2021-10-06 ENCOUNTER — Ambulatory Visit: Payer: Medicaid Other | Admitting: Nurse Practitioner

## 2021-10-06 VITALS — BP 166/98 | HR 94 | Temp 97.6°F | Wt 263.8 lb

## 2021-10-06 DIAGNOSIS — D72829 Elevated white blood cell count, unspecified: Secondary | ICD-10-CM

## 2021-10-06 DIAGNOSIS — R809 Proteinuria, unspecified: Secondary | ICD-10-CM

## 2021-10-06 DIAGNOSIS — G546 Phantom limb syndrome with pain: Secondary | ICD-10-CM

## 2021-10-06 DIAGNOSIS — E7800 Pure hypercholesterolemia, unspecified: Secondary | ICD-10-CM | POA: Diagnosis not present

## 2021-10-06 DIAGNOSIS — E1159 Type 2 diabetes mellitus with other circulatory complications: Secondary | ICD-10-CM

## 2021-10-06 DIAGNOSIS — L97509 Non-pressure chronic ulcer of other part of unspecified foot with unspecified severity: Secondary | ICD-10-CM | POA: Diagnosis not present

## 2021-10-06 DIAGNOSIS — E11621 Type 2 diabetes mellitus with foot ulcer: Secondary | ICD-10-CM | POA: Diagnosis not present

## 2021-10-06 DIAGNOSIS — E1129 Type 2 diabetes mellitus with other diabetic kidney complication: Secondary | ICD-10-CM | POA: Diagnosis not present

## 2021-10-06 DIAGNOSIS — G894 Chronic pain syndrome: Secondary | ICD-10-CM

## 2021-10-06 DIAGNOSIS — I152 Hypertension secondary to endocrine disorders: Secondary | ICD-10-CM | POA: Diagnosis not present

## 2021-10-06 MED ORDER — GABAPENTIN 600 MG PO TABS: 600.0000 mg | ORAL_TABLET | Freq: Two times a day (BID) | ORAL | 1 refills | Status: AC

## 2021-10-06 NOTE — Assessment & Plan Note (Signed)
Chronic. Not well controlled.  Not currently taking any medication. Feels like it is related to his pain.  Will work to treat pain and follow up in 1 month for reevaluation.  Will re discuss medication at that time if needed.

## 2021-10-06 NOTE — Assessment & Plan Note (Signed)
Chronic. Currently taking Metformin '500mg'$  BID.  Agrees that if a1c is elevated to restart Jardiance.  Labs ordered today.  Would benefit from Statin and ACE/Arb. Will follow up in 1 month for reevaluation.

## 2021-10-06 NOTE — Assessment & Plan Note (Signed)
Chronic. Not currently on medication.  Would benefit from statin.  Labs ordered today. Will make recommendations based on lab results.

## 2021-10-06 NOTE — Assessment & Plan Note (Signed)
Chronic. Will restart Gabapentin BID.  Will follow up in 1 month to see how patient's pain is.

## 2021-10-06 NOTE — Assessment & Plan Note (Signed)
Recommend patient seeing Podiatry for further evaluation and management.

## 2021-10-07 LAB — COMPREHENSIVE METABOLIC PANEL
ALT: 19 IU/L (ref 0–44)
AST: 17 IU/L (ref 0–40)
Albumin/Globulin Ratio: 1.5 (ref 1.2–2.2)
Albumin: 4.4 g/dL (ref 3.8–4.9)
Alkaline Phosphatase: 75 IU/L (ref 44–121)
BUN/Creatinine Ratio: 12 (ref 9–20)
BUN: 10 mg/dL (ref 6–24)
Bilirubin Total: 0.2 mg/dL (ref 0.0–1.2)
CO2: 21 mmol/L (ref 20–29)
Calcium: 9.8 mg/dL (ref 8.7–10.2)
Chloride: 100 mmol/L (ref 96–106)
Creatinine, Ser: 0.83 mg/dL (ref 0.76–1.27)
Globulin, Total: 2.9 g/dL (ref 1.5–4.5)
Glucose: 168 mg/dL — ABNORMAL HIGH (ref 70–99)
Potassium: 4.5 mmol/L (ref 3.5–5.2)
Sodium: 136 mmol/L (ref 134–144)
Total Protein: 7.3 g/dL (ref 6.0–8.5)
eGFR: 101 mL/min/{1.73_m2} (ref >59)

## 2021-10-07 LAB — CBC WITH DIFFERENTIAL/PLATELET
Basophils Absolute: 0.1 10*3/uL (ref 0.0–0.2)
Basos: 1 %
EOS (ABSOLUTE): 0.2 10*3/uL (ref 0.0–0.4)
Eos: 1 %
Hematocrit: 49 % (ref 37.5–51.0)
Hemoglobin: 16.5 g/dL (ref 13.0–17.7)
Immature Grans (Abs): 0.1 10*3/uL (ref 0.0–0.1)
Immature Granulocytes: 1 %
Lymphocytes Absolute: 3.6 10*3/uL — ABNORMAL HIGH (ref 0.7–3.1)
Lymphs: 28 %
MCH: 29.2 pg (ref 26.6–33.0)
MCHC: 33.7 g/dL (ref 31.5–35.7)
MCV: 87 fL (ref 79–97)
Monocytes Absolute: 0.6 10*3/uL (ref 0.1–0.9)
Monocytes: 4 %
Neutrophils Absolute: 8.6 10*3/uL — ABNORMAL HIGH (ref 1.4–7.0)
Neutrophils: 65 %
Platelets: 249 10*3/uL (ref 150–450)
RBC: 5.66 x10E6/uL (ref 4.14–5.80)
RDW: 13.6 % (ref 11.6–15.4)
WBC: 13.1 10*3/uL — ABNORMAL HIGH (ref 3.4–10.8)

## 2021-10-07 LAB — LIPID PANEL
Chol/HDL Ratio: 6.4 ratio — ABNORMAL HIGH (ref 0.0–5.0)
Cholesterol, Total: 197 mg/dL (ref 100–199)
HDL: 31 mg/dL — ABNORMAL LOW (ref >39)
LDL Chol Calc (NIH): 117 mg/dL — ABNORMAL HIGH (ref 0–99)
Triglycerides: 277 mg/dL — ABNORMAL HIGH (ref 0–149)
VLDL Cholesterol Cal: 49 mg/dL — ABNORMAL HIGH (ref 5–40)

## 2021-10-07 LAB — HEMOGLOBIN A1C
Est. average glucose Bld gHb Est-mCnc: 189 mg/dL
Hgb A1c MFr Bld: 8.2 % — ABNORMAL HIGH (ref 4.8–5.6)

## 2021-10-07 MED ORDER — EMPAGLIFLOZIN 10 MG PO TABS: 10.0000 mg | ORAL_TABLET | Freq: Every day | ORAL | 1 refills | Status: AC

## 2021-10-07 NOTE — Addendum Note (Signed)
Addended by: Jon Billings on: 10/07/2021 08:17 AM   Modules accepted: Orders

## 2021-10-07 NOTE — Progress Notes (Signed)
Please let patient know that his lab work shows that his A1c improved from 10.9 to 8.2 which is great news.  However, it is still above goal.  I would like him to restart the Jardiance so I have sent this to the pharmacy for him.  This will also help to protect his heart and kidneys so benefits him in several ways.   Please let patient know that his cholesterol is elevated.  His cardiac risk score puts his at high risk of having a stroke or heart attack over the next 10 years.  I recommend that he start a statin called crestor '5mg'$  daily.  The goal will be to increase this to '20mg'$  daily if patient tolerates it well.  If he agrees to the medication I can send it to the pharmacy.    The 10-year ASCVD risk score (Arnett DK, et al., 2019) is: 45.4%   Values used to calculate the score:     Age: 61 years     Sex: Male     Is Non-Hispanic African American: No     Diabetic: Yes     Tobacco smoker: Yes     Systolic Blood Pressure: 758 mmHg     Is BP treated: No     HDL Cholesterol: 31 mg/dL     Total Cholesterol: 197 mg/dL

## 2021-10-13 ENCOUNTER — Telehealth: Payer: Self-pay

## 2021-10-13 NOTE — Telephone Encounter (Signed)
PA has been initiated fro Jardiance through covermymeds. Approved !   Key: [id number]   Attempted to call pt regarding approval. Unable to LVM due to VM not being set up.

## 2021-10-21 ENCOUNTER — Encounter: Payer: Self-pay | Admitting: Nurse Practitioner

## 2021-11-02 ENCOUNTER — Encounter: Payer: Self-pay | Admitting: Nurse Practitioner

## 2021-11-02 ENCOUNTER — Ambulatory Visit (INDEPENDENT_AMBULATORY_CARE_PROVIDER_SITE_OTHER): Payer: Medicaid Other | Admitting: Nurse Practitioner

## 2021-11-02 VITALS — BP 169/100 | HR 100 | Temp 97.8°F | Wt 263.0 lb

## 2021-11-02 DIAGNOSIS — E1159 Type 2 diabetes mellitus with other circulatory complications: Secondary | ICD-10-CM | POA: Diagnosis not present

## 2021-11-02 DIAGNOSIS — G894 Chronic pain syndrome: Secondary | ICD-10-CM | POA: Diagnosis not present

## 2021-11-02 DIAGNOSIS — I152 Hypertension secondary to endocrine disorders: Secondary | ICD-10-CM | POA: Diagnosis not present

## 2021-11-02 MED ORDER — VALSARTAN 80 MG PO TABS: 80.0000 mg | ORAL_TABLET | Freq: Every day | ORAL | 0 refills | Status: AC

## 2021-11-02 NOTE — Assessment & Plan Note (Signed)
Chronic. Not well controlled.  Will start Valsartan '80mg'$  daily.  Side effects and benefits discussed during visit today.  Recommend checking blood pressure at home.  Follow up in 6 months for reevaluation.  Call sooner if concerns arise.

## 2021-11-02 NOTE — Assessment & Plan Note (Signed)
Chronic. Ongoing bilateral shoulder pain.  Information given for [facility] to make an appt.

## 2021-11-02 NOTE — Patient Instructions (Signed)
Referral sent to [facility] via [facility]  Address: [address] [place], [place], [place] [address] Phone: [phone]

## 2021-11-02 NOTE — Progress Notes (Signed)
BP (!) 169/100   Pulse 100   Temp 97.8 F (36.6 C) (Oral)   Wt 263 lb (119.3 kg)   SpO2 98%   BMI 34.70 kg/m    Subjective:    Patient ID: [name] Day., male    DOB: [birth year], 61 y.o.   MRN: [id number]  HPI: [name] Day. is a 61 y.o. male  Chief Complaint  Patient presents with   Hypertension    1 month follow up     HYPERTENSION / HYPERLIPIDEMIA Satisfied with current treatment? no Duration of hypertension: years BP monitoring frequency: not checking BP range:  BP medication side effects: no Past BP meds: none Duration of hyperlipidemia: years Cholesterol medication side effects: no Cholesterol supplements: none Past cholesterol medications: none Medication compliance: poor compliance- not taking any medications.  Aspirin: no Recent stressors: no Recurrent headaches: no Visual changes: no Palpitations: no Dyspnea: yes Chest pain: no Lower extremity edema: no Dizzy/lightheaded: no  CHRONIC PAIN Patient states he is having pain in both arms.  Feels like a burning feeling when he raises both arms.  If he does a lot of work over his head at night his pain is worse.  He has not started taking the Gabapentin.   Relevant past medical, surgical, family and social history reviewed and updated as indicated. Interim medical history since our last visit reviewed. Allergies and medications reviewed and updated.  Review of Systems  Per HPI unless specifically indicated above     Objective:    BP (!) 169/100   Pulse 100   Temp 97.8 F (36.6 C) (Oral)   Wt 263 lb (119.3 kg)   SpO2 98%   BMI 34.70 kg/m   Wt Readings from Last 3 Encounters:  11/02/21 263 lb (119.3 kg)  10/06/21 263 lb 12.8 oz (119.7 kg)  01/05/21 261 lb (118.4 kg)    Physical Exam Vitals and nursing note reviewed.  Constitutional:      General: He is not in acute distress.    Appearance: Normal appearance. He is not ill-appearing, toxic-appearing or diaphoretic.   HENT:     Head: Normocephalic.     Right Ear: External ear normal.     Left Ear: External ear normal.     Nose: Nose normal. No congestion or rhinorrhea.     Mouth/Throat:     Mouth: Mucous membranes are moist.  Eyes:     General:        Right eye: No discharge.        Left eye: No discharge.     Extraocular Movements: Extraocular movements intact.     Conjunctiva/sclera: Conjunctivae normal.     Pupils: Pupils are equal, round, and reactive to light.  Cardiovascular:     Rate and Rhythm: Regular rhythm. Tachycardia present.     Heart sounds: No murmur heard. Pulmonary:     Effort: Pulmonary effort is normal. No respiratory distress.     Breath sounds: No wheezing, rhonchi or rales.  Abdominal:     General: Abdomen is flat. Bowel sounds are normal.  Musculoskeletal:        General: No swelling or tenderness.     Cervical back: Normal range of motion and neck supple.     Comments: Limited shoulder ROM bilaterally.  Skin:    General: Skin is warm and dry.     Capillary Refill: Capillary refill takes less than 2 seconds.  Neurological:     General: No focal deficit present.  Mental Status: He is alert and oriented to person, place, and time.  Psychiatric:        Mood and Affect: Mood normal.        Behavior: Behavior normal.        Thought Content: Thought content normal.        Judgment: Judgment normal.     Results for orders placed or performed in visit on 10/06/21  Comp Met (CMET)  Result Value Ref Range   Glucose 168 (H) 70 - 99 mg/dL   BUN 10 6 - 24 mg/dL   Creatinine, Ser 0.83 0.76 - 1.27 mg/dL   eGFR 101 >59 mL/min/1.73   BUN/Creatinine Ratio 12 9 - 20   Sodium 136 134 - 144 mmol/L   Potassium 4.5 3.5 - 5.2 mmol/L   Chloride 100 96 - 106 mmol/L   CO2 21 20 - 29 mmol/L   Calcium 9.8 8.7 - 10.2 mg/dL   Total Protein 7.3 6.0 - 8.5 g/dL   Albumin 4.4 3.8 - 4.9 g/dL   Globulin, Total 2.9 1.5 - 4.5 g/dL   Albumin/Globulin Ratio 1.5 1.2 - 2.2   Bilirubin  Total 0.2 0.0 - 1.2 mg/dL   Alkaline Phosphatase 75 44 - 121 IU/L   AST 17 0 - 40 IU/L   ALT 19 0 - 44 IU/L  Lipid Profile  Result Value Ref Range   Cholesterol, Total 197 100 - 199 mg/dL   Triglycerides 277 (H) 0 - 149 mg/dL   HDL 31 (L) >39 mg/dL   VLDL Cholesterol Cal 49 (H) 5 - 40 mg/dL   LDL Chol Calc (NIH) 117 (H) 0 - 99 mg/dL   Chol/HDL Ratio 6.4 (H) 0.0 - 5.0 ratio  HgB A1c  Result Value Ref Range   Hgb A1c MFr Bld 8.2 (H) 4.8 - 5.6 %   Est. average glucose Bld gHb Est-mCnc 189 mg/dL  CBC w/Diff  Result Value Ref Range   WBC 13.1 (H) 3.4 - 10.8 x10E3/uL   RBC 5.66 4.14 - 5.80 x10E6/uL   Hemoglobin 16.5 13.0 - 17.7 g/dL   Hematocrit 49.0 37.5 - 51.0 %   MCV 87 79 - 97 fL   MCH 29.2 26.6 - 33.0 pg   MCHC 33.7 31.5 - 35.7 g/dL   RDW 13.6 11.6 - 15.4 %   Platelets 249 150 - 450 x10E3/uL   Neutrophils 65 Not Estab. %   Lymphs 28 Not Estab. %   Monocytes 4 Not Estab. %   Eos 1 Not Estab. %   Basos 1 Not Estab. %   Neutrophils Absolute 8.6 (H) 1.4 - 7.0 x10E3/uL   Lymphocytes Absolute 3.6 (H) 0.7 - 3.1 x10E3/uL   Monocytes Absolute 0.6 0.1 - 0.9 x10E3/uL   EOS (ABSOLUTE) 0.2 0.0 - 0.4 x10E3/uL   Basophils Absolute 0.1 0.0 - 0.2 x10E3/uL   Immature Granulocytes 1 Not Estab. %   Immature Grans (Abs) 0.1 0.0 - 0.1 x10E3/uL      Assessment & Plan:   Problem List Items Addressed This Visit       Cardiovascular and Mediastinum   Hypertension associated with diabetes ([place]) - Primary    Chronic. Not well controlled.  Will start Valsartan 66m daily.  Side effects and benefits discussed during visit today.  Recommend checking blood pressure at home.  Follow up in 6 months for reevaluation.  Call sooner if concerns arise.       Relevant Medications   valsartan (DIOVAN) 80 MG tablet  Other   Chronic pain syndrome    Chronic. Ongoing bilateral shoulder pain.  Information given for [facility] to make an appt.         Follow up plan: Return in about 2 months  (around 01/03/2022) for HTN, HLD, DM2 FU.

## 2021-11-05 ENCOUNTER — Ambulatory Visit: Payer: Medicaid Other | Admitting: Nurse Practitioner

## 2022-01-03 ENCOUNTER — Ambulatory Visit: Payer: Medicaid Other | Admitting: Nurse Practitioner

## 2022-01-03 NOTE — Progress Notes (Deleted)
There were no vitals taken for this visit.   Subjective:    Patient ID: [name] Amy., male    DOB: [birth year], 61 y.o.   MRN: [id number]  HPI: [name] Roark. is a 61 y.o. male  No chief complaint on file.  DIABETES Patient only taking his Metformin.   Hypoglycemic episodes:no Polydipsia/polyuria: yes Visual disturbance: no Chest pain: no Paresthesias: yes Glucose Monitoring: no  Accucheck frequency: Not Checking  Fasting glucose:  Post prandial:  Evening:  Before meals: Taking Insulin?: no  Long acting insulin:  Short acting insulin: Blood Pressure Monitoring: not checking Retinal Examination: Not up to Date Foot Exam: Up to Date Diabetic Education: Not Completed Pneumovax: Not up to Date Influenza: Not up to Date Aspirin: no Patient needs to go back and see the podiatrist.  He states the place on his foot likely needs debridement.   HYPERTENSION / HYPERLIPIDEMIA Patient states he thinks it is related to his back pain that is slowing him down.   Satisfied with current treatment? no Duration of hypertension: years BP monitoring frequency: not checking BP range:  BP medication side effects: no Past BP meds: none Duration of hyperlipidemia: years Cholesterol medication side effects: no Cholesterol supplements: none Past cholesterol medications: none Medication compliance: poor compliance- not taking any medications.  Aspirin: no Recent stressors: no Recurrent headaches: no Visual changes: no Palpitations: no Dyspnea: yes Chest pain: no Lower extremity edema: no Dizzy/lightheaded: no  CHRONIC PAIN Patient states he hurt his back recently.  He has been having trouble sleeping.  He states the gabapentin was helping his pain when he was taking it.  He would like to restart it.   Relevant past medical, surgical, family and social history reviewed and updated as indicated. Interim medical history since our last visit reviewed. Allergies and  medications reviewed and updated.  Review of Systems  Eyes:  Negative for visual disturbance.  Respiratory:  Negative for chest tightness and shortness of breath.   Cardiovascular:  Negative for chest pain, palpitations and leg swelling.  Endocrine: Positive for polydipsia and polyuria.  Musculoskeletal:  Positive for back pain.  Neurological:  Positive for numbness. Negative for dizziness, light-headedness and headaches.    Per HPI unless specifically indicated above     Objective:    There were no vitals taken for this visit.  Wt Readings from Last 3 Encounters:  11/02/21 263 lb (119.3 kg)  10/06/21 263 lb 12.8 oz (119.7 kg)  01/05/21 261 lb (118.4 kg)    Physical Exam Vitals and nursing note reviewed.  Constitutional:      General: He is not in acute distress.    Appearance: Normal appearance. He is not ill-appearing, toxic-appearing or diaphoretic.  HENT:     Head: Normocephalic.     Right Ear: External ear normal.     Left Ear: External ear normal.     Nose: Nose normal. No congestion or rhinorrhea.     Mouth/Throat:     Mouth: Mucous membranes are moist.  Eyes:     General:        Right eye: No discharge.        Left eye: No discharge.     Extraocular Movements: Extraocular movements intact.     Conjunctiva/sclera: Conjunctivae normal.     Pupils: Pupils are equal, round, and reactive to light.  Cardiovascular:     Rate and Rhythm: Regular rhythm. Tachycardia present.     Heart sounds: No murmur heard. Pulmonary:  Effort: Pulmonary effort is normal. No respiratory distress.     Breath sounds: No wheezing, rhonchi or rales.  Abdominal:     General: Abdomen is flat. Bowel sounds are normal.  Musculoskeletal:        General: No swelling or tenderness.     Cervical back: Normal range of motion and neck supple.     Comments: Limited shoulder ROM bilaterally.  Skin:    General: Skin is warm and dry.     Capillary Refill: Capillary refill takes less than 2  seconds.  Neurological:     General: No focal deficit present.     Mental Status: He is alert and oriented to person, place, and time.  Psychiatric:        Mood and Affect: Mood normal.        Behavior: Behavior normal.        Thought Content: Thought content normal.        Judgment: Judgment normal.    Results for orders placed or performed in visit on 10/06/21  Comp Met (CMET)  Result Value Ref Range   Glucose 168 (H) 70 - 99 mg/dL   BUN 10 6 - 24 mg/dL   Creatinine, Ser 0.83 0.76 - 1.27 mg/dL   eGFR 101 >59 mL/min/1.73   BUN/Creatinine Ratio 12 9 - 20   Sodium 136 134 - 144 mmol/L   Potassium 4.5 3.5 - 5.2 mmol/L   Chloride 100 96 - 106 mmol/L   CO2 21 20 - 29 mmol/L   Calcium 9.8 8.7 - 10.2 mg/dL   Total Protein 7.3 6.0 - 8.5 g/dL   Albumin 4.4 3.8 - 4.9 g/dL   Globulin, Total 2.9 1.5 - 4.5 g/dL   Albumin/Globulin Ratio 1.5 1.2 - 2.2   Bilirubin Total 0.2 0.0 - 1.2 mg/dL   Alkaline Phosphatase 75 44 - 121 IU/L   AST 17 0 - 40 IU/L   ALT 19 0 - 44 IU/L  Lipid Profile  Result Value Ref Range   Cholesterol, Total 197 100 - 199 mg/dL   Triglycerides 277 (H) 0 - 149 mg/dL   HDL 31 (L) >39 mg/dL   VLDL Cholesterol Cal 49 (H) 5 - 40 mg/dL   LDL Chol Calc (NIH) 117 (H) 0 - 99 mg/dL   Chol/HDL Ratio 6.4 (H) 0.0 - 5.0 ratio  HgB A1c  Result Value Ref Range   Hgb A1c MFr Bld 8.2 (H) 4.8 - 5.6 %   Est. average glucose Bld gHb Est-mCnc 189 mg/dL  CBC w/Diff  Result Value Ref Range   WBC 13.1 (H) 3.4 - 10.8 x10E3/uL   RBC 5.66 4.14 - 5.80 x10E6/uL   Hemoglobin 16.5 13.0 - 17.7 g/dL   Hematocrit 49.0 37.5 - 51.0 %   MCV 87 79 - 97 fL   MCH 29.2 26.6 - 33.0 pg   MCHC 33.7 31.5 - 35.7 g/dL   RDW 13.6 11.6 - 15.4 %   Platelets 249 150 - 450 x10E3/uL   Neutrophils 65 Not Estab. %   Lymphs 28 Not Estab. %   Monocytes 4 Not Estab. %   Eos 1 Not Estab. %   Basos 1 Not Estab. %   Neutrophils Absolute 8.6 (H) 1.4 - 7.0 x10E3/uL   Lymphocytes Absolute 3.6 (H) 0.7 - 3.1  x10E3/uL   Monocytes Absolute 0.6 0.1 - 0.9 x10E3/uL   EOS (ABSOLUTE) 0.2 0.0 - 0.4 x10E3/uL   Basophils Absolute 0.1 0.0 - 0.2 x10E3/uL   Immature  Granulocytes 1 Not Estab. 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

## 2022-01-09 ENCOUNTER — Ambulatory Visit
Admission: EM | Admit: 2022-01-09 | Discharge: 2022-01-09 | Disposition: A | Discharge status: Home / Self Care | Payer: Medicaid Other | Attending: Internal Medicine | Admitting: Internal Medicine

## 2022-01-09 ENCOUNTER — Encounter: Payer: Self-pay | Admitting: Emergency Medicine

## 2022-01-09 DIAGNOSIS — E11621 Type 2 diabetes mellitus with foot ulcer: Secondary | ICD-10-CM

## 2022-01-09 DIAGNOSIS — L97511 Non-pressure chronic ulcer of other part of right foot limited to breakdown of skin: Secondary | ICD-10-CM | POA: Diagnosis not present

## 2022-01-09 MED ORDER — DOXYCYCLINE HYCLATE 100 MG PO CAPS: 100.0000 mg | ORAL_CAPSULE | Freq: Two times a day (BID) | ORAL | 0 refills | Status: DC

## 2022-01-09 NOTE — Discharge Instructions (Addendum)
You were seen today for diabetic foot ulcer.  This is a chronic issue.  I am covering you with antibiotics just to prevent infection.  Okay to soak the foot with Epsom salt, pat dry and keep covered.  Follow-up with your PCP as well as the wound clinic.

## 2022-01-09 NOTE — ED Triage Notes (Addendum)
Pt has wound on the bottom of his right foot. He states he noticed it yesterday. He states he has numbness in his foot. He has been soaking and keeping it clean. Pt has not had any of his diabetes or BP medications in more than a month.

## 2022-01-09 NOTE — ED Provider Notes (Signed)
[facility]    CSN: [id number] Arrival date & time: 01/09/22  1532      History   Chief Complaint Chief Complaint  Patient presents with   Wound Check    Right foot    HPI [name] Day. is a 61 y.o. male with a history of HTN, HLD, DM 2 with peripheral neuropathy, chronic low back pain presents to the urgent care today with complaint of a chronic wound of his right foot.  He reports this has been there for years, but it just seemed to open up yesterday.  He has noticed very small amount of bloody drainage from the area.  He has not noticed any redness, warmth or purulent drainage.  He denies fever, chills or body aches.  He denies fever, chills, nausea or vomiting.  He has been soaking his foot and trying to keep the area clean.  HPI  Past Medical History:  Diagnosis Date   Arthritis    hands   Depression    Diabetes mellitus without complication ([place])    Dyspnea    Hx of BKA ([place])    due to complicated fracture wears prosthesis   Panic attack    Wears dentures    upper and lower full plate    Patient Active Problem List   Diagnosis Date Noted   Wound cellulitis 10/20/2020   Type 2 diabetes mellitus with foot ulcer ([place]) 10/20/2020   Tobacco use 10/20/2020   Sepsis ([place]) 10/20/2020   Hypercholesteremia 08/25/2020   Mass of joint of right shoulder 08/25/2020   Type 2 diabetes mellitus with proteinuria ([place]) 09/06/2018   Hypertension associated with diabetes ([place]) 08/03/2018   Long term current use of opiate analgesic 12/12/2017   Morbid obesity ([place]) 09/11/2017   Nicotine dependence, cigarettes, w unsp disorders 09/11/2017   Lumbar spondylosis 09/11/2017   Chronic sacroiliac joint pain 09/11/2017   Chronic bilateral low back pain with left-sided sciatica 09/11/2017   S/P BKA (below knee amputation) unilateral, left ([place]) 03/29/2017   Chronic pain syndrome 03/29/2017   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[place]) 03/29/2017    Chronic left hip pain 03/04/2017   Traumatic arthropathy of ankle and foot 12/26/2011   Pain in joint involving ankle and foot 11/09/2011   Primary localized osteoarthrosis of ankle and foot 10/03/2011    Past Surgical History:  Procedure Laterality Date   COLONOSCOPY WITH PROPOFOL N/A 09/27/2018   Procedure: COLONOSCOPY WITH BIOPSY;  Surgeon: Lucilla Lame, MD;  Location: [place];  Service: Endoscopy;  Laterality: N/A;   HAND SURGERY     LEG AMPUTATION     LEG SURGERY     POLYPECTOMY N/A 09/27/2018   Procedure: POLYPECTOMY;  Surgeon: Lucilla Lame, MD;  Location: [place];  Service: Endoscopy;  Laterality: N/A;       Home Medications    Prior to Admission medications   Medication Sig Start Date End Date Taking? Authorizing Provider  doxycycline (VIBRAMYCIN) 100 MG capsule Take 1 capsule (100 mg total) by mouth 2 (two) times daily. 01/09/22  Yes Jearld Fenton, NP  empagliflozin (JARDIANCE) 10 MG TABS tablet Take 1 tablet (10 mg total) by mouth daily before breakfast. 10/07/21   Jon Billings, NP  gabapentin (NEURONTIN) 600 MG tablet Take 1 tablet (600 mg total) by mouth 2 (two) times daily. 1200 mg  BID 10/06/21   Jon Billings, NP  metFORMIN (GLUCOPHAGE) 500 MG tablet Take 1 tablet (500 mg total) by mouth  2 (two) times daily with a meal. 08/20/20 10/20/20  Margarette Canada, NP  valsartan (DIOVAN) 80 MG tablet Take 1 tablet (80 mg total) by mouth daily. 11/02/21   Jon Billings, NP    Family History Family History  Problem Relation Age of Onset   Cancer Mother        Mastatic   Diabetes Father    Hypertension Father    Heart disease Father    Alzheimer's disease Maternal Grandmother    Cancer Maternal Grandfather    Cancer Paternal Grandmother     Social History Social History   Tobacco Use   Smoking status: Every Day    Packs/day: 1.00    Years: 45.00    Total pack years: 45.00    Types: Cigarettes   Smokeless tobacco: Never  Vaping Use    Vaping Use: Never used  Substance Use Topics   Alcohol use: No   Drug use: No     Allergies   Tramadol   Review of Systems Review of Systems   Past Medical History:  Diagnosis Date   Arthritis    hands   Depression    Diabetes mellitus without complication (HCC)    Dyspnea    Hx of BKA ([place])    due to complicated fracture wears prosthesis   Panic attack    Wears dentures    upper and lower full plate    No current facility-administered medications for this encounter.   Current Outpatient Medications  Medication Sig Dispense Refill   doxycycline (VIBRAMYCIN) 100 MG capsule Take 1 capsule (100 mg total) by mouth 2 (two) times daily. 20 capsule 0   empagliflozin (JARDIANCE) 10 MG TABS tablet Take 1 tablet (10 mg total) by mouth daily before breakfast. 90 tablet 1   gabapentin (NEURONTIN) 600 MG tablet Take 1 tablet (600 mg total) by mouth 2 (two) times daily. 1200 mg  BID 180 tablet 1   metFORMIN (GLUCOPHAGE) 500 MG tablet Take 1 tablet (500 mg total) by mouth 2 (two) times daily with a meal. 60 tablet 1   valsartan (DIOVAN) 80 MG tablet Take 1 tablet (80 mg total) by mouth daily. 60 tablet 0    Allergies  Allergen Reactions   Tramadol Diarrhea    Severe Headaches    Family History  Problem Relation Age of Onset   Cancer Mother        Mastatic   Diabetes Father    Hypertension Father    Heart disease Father    Alzheimer's disease Maternal Grandmother    Cancer Maternal Grandfather    Cancer Paternal Grandmother     Social History   Socioeconomic History   Marital status: Married    Spouse name: Not on file   Number of children: Not on file   Years of education: Not on file   Highest education level: Not on file  Occupational History   Not on file  Tobacco Use   Smoking status: Every Day    Packs/day: 1.00    Years: 45.00    Total pack years: 45.00    Types: Cigarettes   Smokeless tobacco: Never  Vaping Use   Vaping Use: Never used  Substance  and Sexual Activity   Alcohol use: No   Drug use: No   Sexual activity: Yes    Partners: Female  Other Topics Concern   Not on file  Social History Narrative   Not on file   Social Determinants of Health  Financial Resource Strain: Not on file  Food Insecurity: Not on file  Transportation Needs: Not on file  Physical Activity: Not on file  Stress: Not on file  Social Connections: Not on file  Intimate Partner Violence: Not on file     Constitutional: Denies fever, malaise, fatigue, headache or abrupt weight changes.  Respiratory: Denies difficulty breathing, shortness of breath, cough or sputum production.   Cardiovascular: Denies chest pain, chest tightness, palpitations or swelling in the hands or feet.  Musculoskeletal: Patient reports amputation of his left leg.  Denies decrease in range of motion, difficulty with gait, muscle pain or joint pain and swelling.  Skin: Patient reports open wound to the bottom of the right foot.  Denies redness, rashes, lesions or ulcercations.  Neurological: Patient reports neuropathy.  Denies dizziness, difficulty with memory, difficulty with speech or problems with balance and coordination.   No other specific complaints in a complete review of systems (except as listed in HPI above).  Physical Exam Triage Vital Signs ED Triage Vitals  Enc Vitals Group     BP      Pulse      Resp      Temp      Temp src      SpO2      Weight      Height      Head Circumference      Peak Flow      Pain Score      Pain Loc      Pain Edu?      Excl. in [place]?    No data found.  Updated Vital Signs BP (!) 187/98 (BP Location: Right Arm)   Pulse 76   Temp 98.2 F (36.8 C) (Oral)   Resp 18   Ht '6\' 1"'$  (1.854 m)   Wt 263 lb 0.1 oz (119.3 kg)   SpO2 95%   BMI 34.70 kg/m      Physical Exam  BP (!) 187/98 (BP Location: Right Arm)   Pulse 76   Temp 98.2 F (36.8 C) (Oral)   Resp 18   Ht '6\' 1"'$  (1.854 m)   Wt 263 lb 0.1 oz (119.3 kg)    SpO2 95%   BMI 34.70 kg/m  Wt Readings from Last 3 Encounters:  01/09/22 263 lb 0.1 oz (119.3 kg)  11/02/21 263 lb (119.3 kg)  10/06/21 263 lb 12.8 oz (119.7 kg)    General: Appears their stated age, well developed, well nourished in NAD. Skin: 3-1/2 x 2-1/2 cm callus noted over the distal metatarsals with an area of approximately 1 and half centimeters open area with scant bloody drainage.  No odor.  No redness or warmth noted. Cardiovascular: Normal rate and rhythm. Pulmonary/Chest: Normal effort and positive vesicular breath sounds.  Musculoskeletal: Prosthesis noted of his left lower extremity.  No difficulty with gait. Neurological: Alert and oriented.  Decreased sensation noted of his right lower extremity.  BMET    Component Value Date/Time   NA 136 10/06/2021 1146   NA 140 04/07/2012 2254   K 4.5 10/06/2021 1146   K 4.2 04/07/2012 2254   CL 100 10/06/2021 1146   CL 105 04/07/2012 2254   CO2 21 10/06/2021 1146   CO2 29 04/07/2012 2254   GLUCOSE 168 (H) 10/06/2021 1146   GLUCOSE 185 (H) 10/21/2020 0422   GLUCOSE 96 04/07/2012 2254   BUN 10 10/06/2021 1146   BUN 11 04/07/2012 2254   CREATININE 0.83 10/06/2021 1146  CREATININE 0.74 04/07/2012 2254   CALCIUM 9.8 10/06/2021 1146   CALCIUM 8.9 04/07/2012 2254   GFRNONAA >60 10/21/2020 0422   GFRNONAA >60 04/07/2012 2254   GFRAA 118 03/11/2020 1529   GFRAA >60 04/07/2012 2254    Lipid Panel     Component Value Date/Time   CHOL 197 10/06/2021 1146   TRIG 277 (H) 10/06/2021 1146   HDL 31 (L) 10/06/2021 1146   CHOLHDL 6.4 (H) 10/06/2021 1146   LDLCALC 117 (H) 10/06/2021 1146    CBC    Component Value Date/Time   WBC 13.1 (H) 10/06/2021 1146   WBC 12.0 (H) 10/21/2020 0422   RBC 5.66 10/06/2021 1146   RBC 4.78 10/21/2020 0422   HGB 16.5 10/06/2021 1146   HCT 49.0 10/06/2021 1146   PLT 249 10/06/2021 1146   MCV 87 10/06/2021 1146   MCV 89 04/07/2012 2254   MCH 29.2 10/06/2021 1146   MCH 29.5 10/21/2020 0422    MCHC 33.7 10/06/2021 1146   MCHC 35.0 10/21/2020 0422   RDW 13.6 10/06/2021 1146   RDW 14.4 04/07/2012 2254   LYMPHSABS 3.6 (H) 10/06/2021 1146   LYMPHSABS 2.1 06/02/2011 0924   MONOABS 0.6 08/20/2020 1210   MONOABS 0.6 06/02/2011 0924   EOSABS 0.2 10/06/2021 1146   EOSABS 0.2 06/02/2011 0924   BASOSABS 0.1 10/06/2021 1146   BASOSABS 0.0 06/02/2011 0924    Hgb A1C Lab Results  Component Value Date   HGBA1C 8.2 (H) 10/06/2021       UC Treatments / Results   Medications Ordered in UC Medications - No data to display  Initial Impression / Assessment and Plan / UC Course  I have reviewed the triage vital signs and the nursing notes.  Pertinent labs & imaging results that were available during my care of the patient were reviewed by me and considered in my medical decision making (see chart for details).   Chronic Diabetic Ulcer of Right Foot:  Wound will be cleansed with normal saline and covered with a dry dressing There are no overt signs of infection but given the fact that his has recently opened, will cover with Doxycycline 100 mg twice daily x10 days Okay to continue to soak the foot in Epsom salt, pat dry. Advised to keep the open wound covered. He will follow-up with his PCP for the same but I will also place referral to the wound clinic as this is a chronic issue and will likely need further evaluation outside of his PCP Final Clinical Impressions(s) / UC Diagnoses   Final diagnoses:  Diabetic ulcer of other part of right foot associated with type 2 diabetes mellitus, limited to breakdown of skin [facility])     Discharge Instructions      You were seen today for diabetic foot ulcer.  This is a chronic issue.  I am covering you with antibiotics just to prevent infection.  Okay to soak the foot with Epsom salt, pat dry and keep covered.  Follow-up with your PCP as well as the wound clinic.     ED Prescriptions     Medication Sig Dispense Auth. Provider    doxycycline (VIBRAMYCIN) 100 MG capsule Take 1 capsule (100 mg total) by mouth 2 (two) times daily. 20 capsule Jearld Fenton, NP      PDMP not reviewed this encounter.   Jearld Fenton, NP 01/09/22 1601

## 2022-01-19 ENCOUNTER — Telehealth: Payer: Self-pay | Admitting: Nurse Practitioner

## 2022-01-19 NOTE — Telephone Encounter (Signed)
PT dropped off medical paperwork from [facility] will make appointment.  Paper work is put in the incomplete bin.  Information purposes only!

## 2022-01-20 NOTE — Telephone Encounter (Signed)
Needs appt before it can be filled out.

## 2022-01-21 NOTE — Telephone Encounter (Signed)
PT made an appointment with another provider on 01/24/2022 @ 11:20 am.

## 2022-01-24 ENCOUNTER — Ambulatory Visit: Payer: Medicaid Other | Admitting: Physician Assistant

## 2022-01-24 ENCOUNTER — Encounter: Payer: Self-pay | Admitting: Physician Assistant

## 2022-01-24 VITALS — BP 170/108 | HR 109 | Temp 99.2°F | Ht 72.99 in | Wt 274.3 lb

## 2022-01-24 DIAGNOSIS — Z89512 Acquired absence of left leg below knee: Secondary | ICD-10-CM | POA: Diagnosis not present

## 2022-01-24 NOTE — Assessment & Plan Note (Signed)
Chronic, ongoing condition He brought paperwork from [facility] requesting script for new prosthesis - paperwork mentions that his current prosthetic is over 60 years old, no longer fits correctly, and is in danger of cracking  DME script provided today for new Prosthetic and casting per request, along with DME script for sleeves Will continue to collaborate with his Prosthetics team to provide resolution so he can continue ambulating safely.  Follow up as needed.

## 2022-01-24 NOTE — Progress Notes (Signed)
Established Patient Office Visit  Name: [name] Day.   MRN: [id number]    DOB: [birth year]   Date:01/24/2022  Today's Provider: Talitha Givens, MHS, PA-C Introduced myself to the patient as a PA-C and provided education on APPs in clinical practice.         Subjective  Chief Complaint  Chief Complaint  Patient presents with   Paperwork    For sleeves for his prosthesis    HPI  Patient is in need of script for new prosthesis for left BKA per his prosthetics and orthotics provider Reviewed this with patient - he is unsure what size sleeves he requires  He states he is falling frequently due to prosthesis being loose and cracking- reports that uneven ground is often treacherous for him as his prosthetic does not attach securely anymore.  He declines offer of crutches or other assistive ambulation devices at this time    Patient Active Problem List   Diagnosis Date Noted   Wound cellulitis 10/20/2020   Type 2 diabetes mellitus with foot ulcer ([place]) 10/20/2020   Tobacco use 10/20/2020   Sepsis ([place]) 10/20/2020   Hypercholesteremia 08/25/2020   Mass of joint of right shoulder 08/25/2020   Type 2 diabetes mellitus with proteinuria ([place]) 09/06/2018   Hypertension associated with diabetes ([place]) 08/03/2018   Long term current use of opiate analgesic 12/12/2017   Morbid obesity ([place]) 09/11/2017   Nicotine dependence, cigarettes, w unsp disorders 09/11/2017   Lumbar spondylosis 09/11/2017   Chronic sacroiliac joint pain 09/11/2017   Chronic bilateral low back pain with left-sided sciatica 09/11/2017   S/P BKA (below knee amputation) unilateral, left ([place]) 03/29/2017   Chronic pain syndrome 03/29/2017   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[place]) 03/29/2017   Chronic left hip pain 03/04/2017   Traumatic arthropathy of ankle and foot 12/26/2011   Pain in joint involving ankle and foot 11/09/2011   Primary localized osteoarthrosis of ankle and foot  10/03/2011    Past Surgical History:  Procedure Laterality Date   COLONOSCOPY WITH PROPOFOL N/A 09/27/2018   Procedure: COLONOSCOPY WITH BIOPSY;  Surgeon: Lucilla Lame, MD;  Location: [facility];  Service: Endoscopy;  Laterality: N/A;   HAND SURGERY     LEG AMPUTATION     LEG SURGERY     POLYPECTOMY N/A 09/27/2018   Procedure: POLYPECTOMY;  Surgeon: Lucilla Lame, MD;  Location: [facility];  Service: Endoscopy;  Laterality: N/A;    Family History  Problem Relation Age of Onset   Cancer Mother        Mastatic   Diabetes Father    Hypertension Father    Heart disease Father    Alzheimer's disease Maternal Grandmother    Cancer Maternal Grandfather    Cancer Paternal Grandmother     Social History   Tobacco Use   Smoking status: Every Day    Packs/day: 1.00    Years: 45.00    Total pack years: 45.00    Types: Cigarettes   Smokeless tobacco: Never  Substance Use Topics   Alcohol use: No     Current Outpatient Medications:    doxycycline (VIBRAMYCIN) 100 MG capsule, Take 1 capsule (100 mg total) by mouth 2 (two) times daily., Disp: 20 capsule, Rfl: 0   empagliflozin (JARDIANCE) 10 MG TABS tablet, Take 1 tablet (10 mg total) by mouth daily before breakfast., Disp: 90 tablet, Rfl: 1   gabapentin (NEURONTIN) 600 MG tablet, Take 1  tablet (600 mg total) by mouth 2 (two) times daily. 1200 mg  BID, Disp: 180 tablet, Rfl: 1   metFORMIN (GLUCOPHAGE) 500 MG tablet, Take 1 tablet (500 mg total) by mouth 2 (two) times daily with a meal., Disp: 60 tablet, Rfl: 1   valsartan (DIOVAN) 80 MG tablet, Take 1 tablet (80 mg total) by mouth daily., Disp: 60 tablet, Rfl: 0  Allergies  Allergen Reactions   Tramadol Diarrhea    Severe Headaches    I personally reviewed active problem list, medication list, notes from last encounter, lab results with the patient/caregiver today.   Review of Systems  Musculoskeletal:  Positive for falls ("i fall all the time").  Skin:         Denies concerns for skin breakdown, redness or irritation at prosthetic site   Neurological:  Negative for loss of consciousness.      Objective  Vitals:   01/24/22 1554 01/24/22 1557  BP: (!) 178/101 (!) 170/108  Pulse: (!) 110 (!) 109  Temp: 99.2 F (37.3 C)   TempSrc: Oral   SpO2: 99%   Weight: 274 lb 4.8 oz (124.4 kg)   Height: 6' 0.99" (1.854 m)     Body mass index is 36.2 kg/m.  Physical Exam Vitals reviewed.  Constitutional:      General: He is awake.     Appearance: Normal appearance. He is well-developed.  HENT:     Head: Normocephalic and atraumatic.  Pulmonary:     Effort: Pulmonary effort is normal.  Musculoskeletal:     Left Lower Extremity: Left leg is amputated below knee.  Neurological:     Mental Status: He is alert.  Psychiatric:        Attention and Perception: Attention and perception normal.        Mood and Affect: Mood and affect normal.        Speech: Speech normal.        Behavior: Behavior normal. Behavior is cooperative.      No results found for this or any previous visit (from the past 2160 hour(s)).   PHQ2/9:    10/06/2021   11:28 AM 10/19/2020    3:27 PM 03/11/2020    2:54 PM 08/28/2018    3:55 PM 08/03/2018    9:04 AM  Depression screen PHQ 2/9  Decreased Interest 3 0 3 0 0  Down, Depressed, Hopeless 1 0 0 0 0  PHQ - 2 Score 4 0 3 0 0  Altered sleeping '3  3  3  '$ Tired, decreased energy 0  3  0  Change in appetite 2  3  0  Feeling bad or failure about yourself  0  0  0  Trouble concentrating 1  2  0  Moving slowly or fidgety/restless 1  1  0  Suicidal thoughts 0  0  0  PHQ-9 Score '11  15  3  '$ Difficult doing work/chores Somewhat difficult  Somewhat difficult  Not difficult at all      Fall Risk:    10/06/2021   11:28 AM 10/19/2020    3:26 PM 08/28/2018    3:54 PM 06/04/2018    8:38 AM 03/12/2018    9:18 AM  Fall Risk   Falls in the past year? '1 1 1 1 1  '$ Number falls in past yr: 1 0 0    Injury with Fall? 1 1 0 0    Comment     increased pain in my back and  hip, legs  Risk for fall due to : History of fall(s) Impaired mobility Other (Comment)    Risk for fall due to: Comment   left leg stump    Follow up Falls evaluation completed  Education provided;Falls prevention discussed        Functional Status Survey:      Assessment & Plan  Problem List Items Addressed This Visit       Other   S/P BKA (below knee amputation) unilateral, left (HCC) - Primary    Chronic, ongoing condition He brought paperwork from [facility] requesting script for new prosthesis - paperwork mentions that his current prosthetic is over 80 years old, no longer fits correctly, and is in danger of cracking  DME script provided today for new Prosthetic and casting per request, along with DME script for sleeves Will continue to collaborate with his Prosthetics team to provide resolution so he can continue ambulating safely.  Follow up as needed.       Relevant Orders   For home use only DME Other see comment   For home use only DME Other see comment     No follow-ups on file.   I, Birdia Jaycox E Nayelis Bonito, PA-C, have reviewed all documentation for this visit. The documentation on 01/24/22 for the exam, diagnosis, procedures, and orders are all accurate and complete.   Talitha Givens, MHS, PA-C [facility]

## 2022-01-31 NOTE — Telephone Encounter (Signed)
Pt called saying he came in to get the paperwork completed but he said they still have not received the fax   CB@ [phone]

## 2022-02-01 NOTE — Telephone Encounter (Signed)
Office note printed and faxed to [place] at [phone]

## 2022-02-01 NOTE — Telephone Encounter (Signed)
[name] Day with [facility] is calling in to request ov notes for DOS 10/16 (Erin Mecum) faxed over to them at: [phone] (fax) they are working on pt's prosthetic and need notes for insurance.    Please assist further.

## 2022-02-03 ENCOUNTER — Telehealth: Payer: Self-pay | Admitting: Nurse Practitioner

## 2022-02-03 NOTE — Telephone Encounter (Signed)
Copied from [place] [id number]. Topic: General - Inquiry >> Feb 03, 2022  1:59 PM Devoria Glassing wrote: Reason for CRM: [facility] sent a fax on 10/24 for a prosthetic replacement, (Rx) would like to know if you have received and is it signed?? They also need dr Ok Edwards notes. Fax: [phone]

## 2022-02-04 NOTE — Telephone Encounter (Signed)
Documentation and paperwork have been faxed to number provided.

## 2022-02-10 ENCOUNTER — Telehealth: Payer: Self-pay

## 2022-02-10 NOTE — Telephone Encounter (Signed)
Pt has been made aware. 

## 2022-02-10 NOTE — Telephone Encounter (Signed)
Attempted to call patient to inform him that we have his paperwork ready and have attempted to fax over to [place] multiple times but it is unsuccessful. Spoke with Annelle from [facility], states she is having issues with thei fax machine. We decided to attempt to have patient pick up the forms and bring it to [facility] office, unable to reach patient. Not able to LVM due to inbox being full. [facility] will also attempt to reach out to patient.

## 2022-03-06 ENCOUNTER — Ambulatory Visit
Admission: EM | Admit: 2022-03-06 | Discharge: 2022-03-06 | Disposition: A | Discharge status: Home / Self Care | Payer: Medicaid Other

## 2022-03-06 ENCOUNTER — Encounter: Payer: Self-pay | Admitting: Emergency Medicine

## 2022-03-06 DIAGNOSIS — L97519 Non-pressure chronic ulcer of other part of right foot with unspecified severity: Secondary | ICD-10-CM | POA: Diagnosis not present

## 2022-03-06 DIAGNOSIS — F32A Depression, unspecified: Secondary | ICD-10-CM | POA: Diagnosis not present

## 2022-03-06 DIAGNOSIS — R030 Elevated blood-pressure reading, without diagnosis of hypertension: Secondary | ICD-10-CM | POA: Diagnosis not present

## 2022-03-06 DIAGNOSIS — G8929 Other chronic pain: Secondary | ICD-10-CM | POA: Diagnosis not present

## 2022-03-06 DIAGNOSIS — S91301A Unspecified open wound, right foot, initial encounter: Secondary | ICD-10-CM

## 2022-03-06 DIAGNOSIS — E1165 Type 2 diabetes mellitus with hyperglycemia: Secondary | ICD-10-CM | POA: Diagnosis not present

## 2022-03-06 DIAGNOSIS — R600 Localized edema: Secondary | ICD-10-CM | POA: Diagnosis not present

## 2022-03-06 DIAGNOSIS — E11628 Type 2 diabetes mellitus with other skin complications: Secondary | ICD-10-CM | POA: Diagnosis not present

## 2022-03-06 DIAGNOSIS — Z87891 Personal history of nicotine dependence: Secondary | ICD-10-CM | POA: Diagnosis not present

## 2022-03-06 DIAGNOSIS — F410 Panic disorder [episodic paroxysmal anxiety] without agoraphobia: Secondary | ICD-10-CM | POA: Diagnosis not present

## 2022-03-06 DIAGNOSIS — E11621 Type 2 diabetes mellitus with foot ulcer: Secondary | ICD-10-CM | POA: Diagnosis not present

## 2022-03-06 DIAGNOSIS — Z7984 Long term (current) use of oral hypoglycemic drugs: Secondary | ICD-10-CM | POA: Diagnosis not present

## 2022-03-06 DIAGNOSIS — E669 Obesity, unspecified: Secondary | ICD-10-CM | POA: Diagnosis not present

## 2022-03-06 DIAGNOSIS — L03115 Cellulitis of right lower limb: Secondary | ICD-10-CM | POA: Diagnosis not present

## 2022-03-06 DIAGNOSIS — L97518 Non-pressure chronic ulcer of other part of right foot with other specified severity: Secondary | ICD-10-CM | POA: Diagnosis not present

## 2022-03-06 DIAGNOSIS — Z89512 Acquired absence of left leg below knee: Secondary | ICD-10-CM | POA: Diagnosis not present

## 2022-03-06 DIAGNOSIS — D72829 Elevated white blood cell count, unspecified: Secondary | ICD-10-CM | POA: Diagnosis not present

## 2022-03-06 DIAGNOSIS — Z6834 Body mass index (BMI) 34.0-34.9, adult: Secondary | ICD-10-CM | POA: Diagnosis not present

## 2022-03-06 NOTE — ED Provider Notes (Signed)
Patient presents for evaluation for wound to the plantar aspect of the right foot for 6 days.  Endorses erythema and swelling as well as pain.  On initial evaluation in triage there is blackened tissue within the wound bed and therefore he has been sent to the nearest emergency department for immediate evaluation.  Has history of osteomyelitis, diabetes, left BKA, current tobacco use, sepsis.  Vital signs are stable, to explore self, has chosen to go to [facility].   Hans Eden, NP 03/06/22 1452

## 2022-03-06 NOTE — ED Triage Notes (Signed)
Patient states that he has a open wound on the bottom of his right foot for the past 6 days.  Patient reports redness and swelling.  Patient denies fevers.

## 2022-03-06 NOTE — Discharge Instructions (Signed)
Wound care put appears to have dead tissue inside and therefore will need to be thoroughly cleaned before any treatment can be done, please go to the nearest emergency department for further evaluation and management

## 2022-03-06 NOTE — ED Notes (Signed)
Patient is being discharged from the Urgent Care and sent to the [facility] Emergency Department via private vehicle . Per Lowella Petties, NP, patient is in need of higher level of care due to wound infection in right foot. Patient is aware and verbalizes understanding of plan of care.  Vitals:   03/06/22 1428  BP: (!) 144/86  Pulse: (!) 118  Resp: 16  Temp: 98.1 F (36.7 C)  SpO2: 99%

## 2022-03-07 DIAGNOSIS — L0889 Other specified local infections of the skin and subcutaneous tissue: Secondary | ICD-10-CM | POA: Diagnosis not present

## 2022-03-07 DIAGNOSIS — E11621 Type 2 diabetes mellitus with foot ulcer: Secondary | ICD-10-CM | POA: Diagnosis not present

## 2022-03-07 DIAGNOSIS — E11628 Type 2 diabetes mellitus with other skin complications: Secondary | ICD-10-CM | POA: Diagnosis not present

## 2022-03-07 DIAGNOSIS — D72829 Elevated white blood cell count, unspecified: Secondary | ICD-10-CM | POA: Diagnosis not present

## 2022-03-07 DIAGNOSIS — S91301A Unspecified open wound, right foot, initial encounter: Secondary | ICD-10-CM | POA: Diagnosis not present

## 2022-03-07 DIAGNOSIS — R030 Elevated blood-pressure reading, without diagnosis of hypertension: Secondary | ICD-10-CM | POA: Diagnosis not present

## 2022-03-07 DIAGNOSIS — R600 Localized edema: Secondary | ICD-10-CM | POA: Diagnosis not present

## 2022-03-07 DIAGNOSIS — L97518 Non-pressure chronic ulcer of other part of right foot with other specified severity: Secondary | ICD-10-CM | POA: Diagnosis not present

## 2022-03-08 DIAGNOSIS — L089 Local infection of the skin and subcutaneous tissue, unspecified: Secondary | ICD-10-CM | POA: Diagnosis not present

## 2022-03-08 DIAGNOSIS — E11628 Type 2 diabetes mellitus with other skin complications: Secondary | ICD-10-CM | POA: Diagnosis not present

## 2022-03-09 ENCOUNTER — Telehealth: Payer: Self-pay

## 2022-03-09 DIAGNOSIS — E11628 Type 2 diabetes mellitus with other skin complications: Secondary | ICD-10-CM | POA: Diagnosis not present

## 2022-03-09 DIAGNOSIS — L089 Local infection of the skin and subcutaneous tissue, unspecified: Secondary | ICD-10-CM | POA: Diagnosis not present

## 2022-03-09 NOTE — Telephone Encounter (Signed)
Patient needs an appt.

## 2022-03-09 NOTE — Telephone Encounter (Signed)
Copied from [place] [id number]. Topic: General - Inquiry >> Mar 09, 2022  1:54 PM Chapman Fitch wrote: Reason for CRM: Pt was in [facility] hospital for a few days and he needs an MRI on his right foot for a broken bone / pt would like to know if Santiago Glad can schedule an MRI asap / [facility] can't get pt scheduled until Saturday / he wants to get one right away / please advise

## 2022-03-11 NOTE — Progress Notes (Deleted)
There were no vitals taken for this visit.   Subjective:    Patient ID: [name] Day., male    DOB: 01-31-1961, 61 y.o.   MRN: [id number]  HPI: [name] Day. is a 61 y.o. male  No chief complaint on file.  Transition of Care Hospital Follow up.   Hospital/Facility: D/C Physician:  D/C Date:   Records Requested:  Records Received:  Records Reviewed:   Diagnoses on Discharge:   Date of interactive Contact within 48 hours of discharge:  Contact was through: {Blank single:19197::"phone","e-mail","direct","other"}  Date of 7 day or 14 day face-to-face visit:    {Blank single:19197::"within 7 days","within 14 days"}  Outpatient Encounter Medications as of 03/14/2022  Medication Sig   doxycycline (VIBRAMYCIN) 100 MG capsule Take 1 capsule (100 mg total) by mouth 2 (two) times daily.   empagliflozin (JARDIANCE) 10 MG TABS tablet Take 1 tablet (10 mg total) by mouth daily before breakfast.   gabapentin (NEURONTIN) 600 MG tablet Take 1 tablet (600 mg total) by mouth 2 (two) times daily. 1200 mg  BID   metFORMIN (GLUCOPHAGE) 500 MG tablet Take 1 tablet (500 mg total) by mouth 2 (two) times daily with a meal.   valsartan (DIOVAN) 80 MG tablet Take 1 tablet (80 mg total) by mouth daily.   No facility-administered encounter medications on file as of 03/14/2022.    Diagnostic Tests Reviewed/Disposition:   Consults:  Discharge Instructions  Disease/illness Education:  Home Health/Community Services Discussions/Referrals:  Establishment or re-establishment of referral orders for community resources:  Discussion with other health care providers:  Assessment and Support of treatment regimen adherence:  Appointments Coordinated with:   Education for self-management, independent living, and ADLs:   Relevant past medical, surgical, family and social history reviewed and updated as indicated. Interim medical history since our last visit reviewed. Allergies  and medications reviewed and updated.  Review of Systems  Per HPI unless specifically indicated above     Objective:    There were no vitals taken for this visit.  Wt Readings from Last 3 Encounters:  03/06/22 263 lb (119.3 kg)  01/24/22 274 lb 4.8 oz (124.4 kg)  01/09/22 263 lb 0.1 oz (119.3 kg)    Physical Exam  Results for orders placed or performed in visit on 10/06/21  Comp Met (CMET)  Result Value Ref Range   Glucose 168 (H) 70 - 99 mg/dL   BUN 10 6 - 24 mg/dL   Creatinine, Ser 0.83 0.76 - 1.27 mg/dL   eGFR 101 >59 mL/min/1.73   BUN/Creatinine Ratio 12 9 - 20   Sodium 136 134 - 144 mmol/L   Potassium 4.5 3.5 - 5.2 mmol/L   Chloride 100 96 - 106 mmol/L   CO2 21 20 - 29 mmol/L   Calcium 9.8 8.7 - 10.2 mg/dL   Total Protein 7.3 6.0 - 8.5 g/dL   Albumin 4.4 3.8 - 4.9 g/dL   Globulin, Total 2.9 1.5 - 4.5 g/dL   Albumin/Globulin Ratio 1.5 1.2 - 2.2   Bilirubin Total 0.2 0.0 - 1.2 mg/dL   Alkaline Phosphatase 75 44 - 121 IU/L   AST 17 0 - 40 IU/L   ALT 19 0 - 44 IU/L  Lipid Profile  Result Value Ref Range   Cholesterol, Total 197 100 - 199 mg/dL   Triglycerides 277 (H) 0 - 149 mg/dL   HDL 31 (L) >39 mg/dL   VLDL Cholesterol Cal 49 (H) 5 - 40 mg/dL   LDL  Chol Calc (NIH) 117 (H) 0 - 99 mg/dL   Chol/HDL Ratio 6.4 (H) 0.0 - 5.0 ratio  HgB A1c  Result Value Ref Range   Hgb A1c MFr Bld 8.2 (H) 4.8 - 5.6 %   Est. average glucose Bld gHb Est-mCnc 189 mg/dL  CBC w/Diff  Result Value Ref Range   WBC 13.1 (H) 3.4 - 10.8 x10E3/uL   RBC 5.66 4.14 - 5.80 x10E6/uL   Hemoglobin 16.5 13.0 - 17.7 g/dL   Hematocrit 49.0 37.5 - 51.0 %   MCV 87 79 - 97 fL   MCH 29.2 26.6 - 33.0 pg   MCHC 33.7 31.5 - 35.7 g/dL   RDW 13.6 11.6 - 15.4 %   Platelets 249 150 - 450 x10E3/uL   Neutrophils 65 Not Estab. %   Lymphs 28 Not Estab. %   Monocytes 4 Not Estab. %   Eos 1 Not Estab. %   Basos 1 Not Estab. %   Neutrophils Absolute 8.6 (H) 1.4 - 7.0 x10E3/uL   Lymphocytes Absolute 3.6 (H)  0.7 - 3.1 x10E3/uL   Monocytes Absolute 0.6 0.1 - 0.9 x10E3/uL   EOS (ABSOLUTE) 0.2 0.0 - 0.4 x10E3/uL   Basophils Absolute 0.1 0.0 - 0.2 x10E3/uL   Immature Granulocytes 1 Not Estab. %   Immature Grans (Abs) 0.1 0.0 - 0.1 x10E3/uL      Assessment & Plan:   Problem List Items Addressed This Visit       Cardiovascular and Mediastinum   Hypertension associated with diabetes ([place]) - Primary     Endocrine   Type 2 diabetes mellitus with foot ulcer ([place])     Follow up plan: No follow-ups on file.

## 2022-03-14 ENCOUNTER — Inpatient Hospital Stay: Payer: Medicaid Other | Admitting: Nurse Practitioner

## 2022-03-14 DIAGNOSIS — E1159 Type 2 diabetes mellitus with other circulatory complications: Secondary | ICD-10-CM

## 2022-03-14 DIAGNOSIS — E11621 Type 2 diabetes mellitus with foot ulcer: Secondary | ICD-10-CM

## 2022-03-21 ENCOUNTER — Ambulatory Visit
Admission: EM | Admit: 2022-03-21 | Discharge: 2022-03-21 | Disposition: A | Discharge status: Other Acute Inpatient Hospital | Payer: Medicaid Other | Attending: Internal Medicine | Admitting: Internal Medicine

## 2022-03-21 ENCOUNTER — Encounter: Payer: Self-pay | Admitting: Emergency Medicine

## 2022-03-21 DIAGNOSIS — R072 Precordial pain: Secondary | ICD-10-CM | POA: Diagnosis not present

## 2022-03-21 NOTE — Discharge Instructions (Addendum)
Please go to the emergency department for further evaluation.  I am concerned that your chest pain is related to your heart and you will need further evaluation for that.

## 2022-03-21 NOTE — ED Triage Notes (Addendum)
Pt presents with heart palpations, dizziness, chest heaviness and SOB that started about an hour ago. Pt reports he does not take any of the medication that was prescribed to him.

## 2022-03-22 NOTE — ED Provider Notes (Signed)
[facility]    CSN: [id number] Arrival date & time: 03/21/22  1501      History   Chief Complaint Chief Complaint  Patient presents with   Shortness of Breath   Palpitations   Dizziness    HPI [name] Day. is a 61 y.o. male comes to the urgent care with 1 day history of precordial chest pain and shortness of breath.  Chest pain onset was fairly abrupt and has been persistent.  It is moderate in intensity.  Aggravated by physical exertion and patient denies any known relieving factors.  No trauma to the chest.  It does not radiate to the shoulder or neck.  It is associated with shortness of breath and dizziness.  No syncope or near syncopal episodes.  No cough or sputum production.  No fever or chills.  Patient has a history of diabetes mellitus type 2, hypertension.  Stress test done about 3 years ago was negative according to the patient.   HPI  Past Medical History:  Diagnosis Date   Arthritis    hands   Depression    Diabetes mellitus without complication ([place])    Dyspnea    Hx of BKA ([place])    due to complicated fracture wears prosthesis   Panic attack    Wears dentures    upper and lower full plate    Patient Active Problem List   Diagnosis Date Noted   Wound cellulitis 10/20/2020   Type 2 diabetes mellitus with foot ulcer ([place]) 10/20/2020   Tobacco use 10/20/2020   Sepsis ([place]) 10/20/2020   Hypercholesteremia 08/25/2020   Mass of joint of right shoulder 08/25/2020   Type 2 diabetes mellitus with proteinuria ([place]) 09/06/2018   Hypertension associated with diabetes ([place]) 08/03/2018   Long term current use of opiate analgesic 12/12/2017   Morbid obesity ([place]) 09/11/2017   Nicotine dependence, cigarettes, w unsp disorders 09/11/2017   Lumbar spondylosis 09/11/2017   Chronic sacroiliac joint pain 09/11/2017   Chronic bilateral low back pain with left-sided sciatica 09/11/2017   S/P BKA (below knee amputation) unilateral, left ([place])  03/29/2017   Chronic pain syndrome 03/29/2017   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[place]) 03/29/2017   Chronic left hip pain 03/04/2017   Traumatic arthropathy of ankle and foot 12/26/2011   Pain in joint involving ankle and foot 11/09/2011   Primary localized osteoarthrosis of ankle and foot 10/03/2011    Past Surgical History:  Procedure Laterality Date   COLONOSCOPY WITH PROPOFOL N/A 09/27/2018   Procedure: COLONOSCOPY WITH BIOPSY;  Surgeon: Lucilla Lame, MD;  Location: [facility];  Service: Endoscopy;  Laterality: N/A;   HAND SURGERY     LEG AMPUTATION     LEG SURGERY     POLYPECTOMY N/A 09/27/2018   Procedure: POLYPECTOMY;  Surgeon: Lucilla Lame, MD;  Location: [facility];  Service: Endoscopy;  Laterality: N/A;       Home Medications    Prior to Admission medications   Medication Sig Start Date End Date Taking? Authorizing Provider  doxycycline (VIBRAMYCIN) 100 MG capsule Take 1 capsule (100 mg total) by mouth 2 (two) times daily. 01/09/22   Jearld Fenton, NP  empagliflozin (JARDIANCE) 10 MG TABS tablet Take 1 tablet (10 mg total) by mouth daily before breakfast. 10/07/21   Jon Billings, NP  gabapentin (NEURONTIN) 600 MG tablet Take 1 tablet (600 mg total) by mouth 2 (two) times daily. 1200 mg  BID 10/06/21   [place],  Santiago Glad, NP  metFORMIN (GLUCOPHAGE) 500 MG tablet Take 1 tablet (500 mg total) by mouth 2 (two) times daily with a meal. 08/20/20 10/20/20  Margarette Canada, NP  valsartan (DIOVAN) 80 MG tablet Take 1 tablet (80 mg total) by mouth daily. 11/02/21   Jon Billings, NP    Family History Family History  Problem Relation Age of Onset   Cancer Mother        Mastatic   Diabetes Father    Hypertension Father    Heart disease Father    Alzheimer's disease Maternal Grandmother    Cancer Maternal Grandfather    Cancer Paternal Grandmother     Social History Social History   Tobacco Use   Smoking status: Every Day    Packs/day:  1.00    Years: 45.00    Total pack years: 45.00    Types: Cigarettes   Smokeless tobacco: Never  Vaping Use   Vaping Use: Never used  Substance Use Topics   Alcohol use: No   Drug use: No     Allergies   Tramadol   Review of Systems Review of Systems As per HPI  Physical Exam Triage Vital Signs ED Triage Vitals  Enc Vitals Group     BP 03/21/22 1512 (!) 157/125     Pulse Rate 03/21/22 1512 (!) 130     Resp 03/21/22 1512 18     Temp 03/21/22 1512 97.8 F (36.6 C)     Temp Source 03/21/22 1512 Oral     SpO2 03/21/22 1512 96 %     Weight --      Height --      Head Circumference --      Peak Flow --      Pain Score 03/21/22 1507 5     Pain Loc --      Pain Edu? --      Excl. in [place]? --    No data found.  Updated Vital Signs BP (!) 142/112 (BP Location: Left Arm)   Pulse (!) 123   Temp 97.8 F (36.6 C) (Oral)   Resp 18   SpO2 99%   Visual Acuity Right Eye Distance:   Left Eye Distance:   Bilateral Distance:    Right Eye Near:   Left Eye Near:    Bilateral Near:     Physical Exam Vitals and nursing note reviewed.  Constitutional:      General: He is not in acute distress.    Appearance: He is not ill-appearing.  Cardiovascular:     Rate and Rhythm: Normal rate and regular rhythm.  Pulmonary:     Breath sounds: No decreased breath sounds, wheezing, rhonchi or rales.  Abdominal:     General: Bowel sounds are normal.     Palpations: Abdomen is soft.  Musculoskeletal:     Cervical back: Normal range of motion.     Comments: Left AKA.  Right foot not examined.  Neurological:     Mental Status: He is alert.      UC Treatments / Results  Labs (all labs ordered are listed, but only abnormal results are displayed) Labs Reviewed - No data to display  EKG   Radiology No results found.  Procedures Procedures (including critical care time)  Medications Ordered in UC Medications - No data to display  Initial Impression / Assessment and  Plan / UC Course  I have reviewed the triage vital signs and the nursing notes.  Pertinent labs & imaging results  that were available during my care of the patient were reviewed by me and considered in my medical decision making (see chart for details).     1.  Precordial chest pain: Chest pain is concerning for angina EKG significant for sinus tachycardia with no ST/T wave changes Patient continues to have chest pain in the urgent care.  Patient is advised to go to the emergency department for further workup.  Patient was offered EMS but he wanted to self transport.  Family member was waiting for the patient and will drive the patient to the hospital.  They chose to go to [facility]. Final Clinical Impressions(s) / UC Diagnoses   Final diagnoses:  Precordial pain     Discharge Instructions      Please go to the emergency department for further evaluation.  I am concerned that your chest pain is related to your heart and you will need further evaluation for that.   ED Prescriptions   None    PDMP not reviewed this encounter.   Chase Picket, MD 03/22/22 [phone]

## 2022-03-25 DIAGNOSIS — Z89512 Acquired absence of left leg below knee: Secondary | ICD-10-CM | POA: Diagnosis not present

## 2022-07-12 DIAGNOSIS — L03115 Cellulitis of right lower limb: Secondary | ICD-10-CM | POA: Diagnosis not present

## 2022-07-12 DIAGNOSIS — G8929 Other chronic pain: Secondary | ICD-10-CM | POA: Diagnosis not present

## 2022-07-12 DIAGNOSIS — Z6834 Body mass index (BMI) 34.0-34.9, adult: Secondary | ICD-10-CM | POA: Diagnosis not present

## 2022-07-12 DIAGNOSIS — Z7984 Long term (current) use of oral hypoglycemic drugs: Secondary | ICD-10-CM | POA: Diagnosis not present

## 2022-07-12 DIAGNOSIS — Z89512 Acquired absence of left leg below knee: Secondary | ICD-10-CM | POA: Diagnosis not present

## 2022-07-12 DIAGNOSIS — E871 Hypo-osmolality and hyponatremia: Secondary | ICD-10-CM | POA: Diagnosis not present

## 2022-07-12 DIAGNOSIS — L97519 Non-pressure chronic ulcer of other part of right foot with unspecified severity: Secondary | ICD-10-CM | POA: Diagnosis not present

## 2022-07-12 DIAGNOSIS — E1165 Type 2 diabetes mellitus with hyperglycemia: Secondary | ICD-10-CM | POA: Diagnosis not present

## 2022-07-12 DIAGNOSIS — F419 Anxiety disorder, unspecified: Secondary | ICD-10-CM | POA: Diagnosis not present

## 2022-07-12 DIAGNOSIS — I10 Essential (primary) hypertension: Secondary | ICD-10-CM | POA: Diagnosis not present

## 2022-07-12 DIAGNOSIS — Z79899 Other long term (current) drug therapy: Secondary | ICD-10-CM | POA: Diagnosis not present

## 2022-07-12 DIAGNOSIS — M869 Osteomyelitis, unspecified: Secondary | ICD-10-CM | POA: Diagnosis not present

## 2022-07-12 DIAGNOSIS — A419 Sepsis, unspecified organism: Secondary | ICD-10-CM | POA: Diagnosis not present

## 2022-07-12 DIAGNOSIS — Z20822 Contact with and (suspected) exposure to covid-19: Secondary | ICD-10-CM | POA: Diagnosis not present

## 2022-07-12 DIAGNOSIS — F17210 Nicotine dependence, cigarettes, uncomplicated: Secondary | ICD-10-CM | POA: Diagnosis not present

## 2022-07-12 DIAGNOSIS — E1169 Type 2 diabetes mellitus with other specified complication: Secondary | ICD-10-CM | POA: Diagnosis not present

## 2022-07-12 DIAGNOSIS — M7989 Other specified soft tissue disorders: Secondary | ICD-10-CM | POA: Diagnosis not present

## 2022-07-12 DIAGNOSIS — K429 Umbilical hernia without obstruction or gangrene: Secondary | ICD-10-CM | POA: Diagnosis not present

## 2022-07-12 DIAGNOSIS — B9562 Methicillin resistant Staphylococcus aureus infection as the cause of diseases classified elsewhere: Secondary | ICD-10-CM | POA: Diagnosis not present

## 2022-07-12 DIAGNOSIS — R6520 Severe sepsis without septic shock: Secondary | ICD-10-CM | POA: Diagnosis not present

## 2022-07-12 DIAGNOSIS — E860 Dehydration: Secondary | ICD-10-CM | POA: Diagnosis not present

## 2022-07-12 DIAGNOSIS — M79651 Pain in right thigh: Secondary | ICD-10-CM | POA: Diagnosis not present

## 2022-07-12 DIAGNOSIS — M79604 Pain in right leg: Secondary | ICD-10-CM | POA: Diagnosis not present

## 2022-07-12 DIAGNOSIS — R600 Localized edema: Secondary | ICD-10-CM | POA: Diagnosis not present

## 2022-07-12 DIAGNOSIS — L97514 Non-pressure chronic ulcer of other part of right foot with necrosis of bone: Secondary | ICD-10-CM | POA: Diagnosis not present

## 2022-07-12 DIAGNOSIS — E669 Obesity, unspecified: Secondary | ICD-10-CM | POA: Diagnosis not present

## 2022-07-12 DIAGNOSIS — E11621 Type 2 diabetes mellitus with foot ulcer: Secondary | ICD-10-CM | POA: Diagnosis not present

## 2022-07-12 DIAGNOSIS — L97418 Non-pressure chronic ulcer of right heel and midfoot with other specified severity: Secondary | ICD-10-CM | POA: Diagnosis not present

## 2022-07-13 DIAGNOSIS — E669 Obesity, unspecified: Secondary | ICD-10-CM | POA: Diagnosis not present

## 2022-07-13 DIAGNOSIS — A419 Sepsis, unspecified organism: Secondary | ICD-10-CM | POA: Diagnosis not present

## 2022-07-13 DIAGNOSIS — E871 Hypo-osmolality and hyponatremia: Secondary | ICD-10-CM | POA: Diagnosis not present

## 2022-07-13 DIAGNOSIS — L97418 Non-pressure chronic ulcer of right heel and midfoot with other specified severity: Secondary | ICD-10-CM | POA: Diagnosis not present

## 2022-07-13 DIAGNOSIS — M79604 Pain in right leg: Secondary | ICD-10-CM | POA: Diagnosis not present

## 2022-07-13 DIAGNOSIS — L97514 Non-pressure chronic ulcer of other part of right foot with necrosis of bone: Secondary | ICD-10-CM | POA: Diagnosis not present

## 2022-07-13 DIAGNOSIS — R6520 Severe sepsis without septic shock: Secondary | ICD-10-CM | POA: Diagnosis not present

## 2022-07-13 DIAGNOSIS — F419 Anxiety disorder, unspecified: Secondary | ICD-10-CM | POA: Diagnosis not present

## 2022-07-13 DIAGNOSIS — R600 Localized edema: Secondary | ICD-10-CM | POA: Diagnosis not present

## 2022-07-13 DIAGNOSIS — Z6834 Body mass index (BMI) 34.0-34.9, adult: Secondary | ICD-10-CM | POA: Diagnosis not present

## 2022-07-13 DIAGNOSIS — E1169 Type 2 diabetes mellitus with other specified complication: Secondary | ICD-10-CM | POA: Diagnosis not present

## 2022-07-13 DIAGNOSIS — F17210 Nicotine dependence, cigarettes, uncomplicated: Secondary | ICD-10-CM | POA: Diagnosis not present

## 2022-07-13 DIAGNOSIS — B9562 Methicillin resistant Staphylococcus aureus infection as the cause of diseases classified elsewhere: Secondary | ICD-10-CM | POA: Diagnosis not present

## 2022-07-13 DIAGNOSIS — Z89512 Acquired absence of left leg below knee: Secondary | ICD-10-CM | POA: Diagnosis not present

## 2022-07-13 DIAGNOSIS — L97519 Non-pressure chronic ulcer of other part of right foot with unspecified severity: Secondary | ICD-10-CM | POA: Diagnosis not present

## 2022-07-13 DIAGNOSIS — E1165 Type 2 diabetes mellitus with hyperglycemia: Secondary | ICD-10-CM | POA: Diagnosis not present

## 2022-07-13 DIAGNOSIS — M869 Osteomyelitis, unspecified: Secondary | ICD-10-CM | POA: Diagnosis not present

## 2022-07-13 DIAGNOSIS — K429 Umbilical hernia without obstruction or gangrene: Secondary | ICD-10-CM | POA: Diagnosis not present

## 2022-07-13 DIAGNOSIS — G8929 Other chronic pain: Secondary | ICD-10-CM | POA: Diagnosis not present

## 2022-07-13 DIAGNOSIS — F17218 Nicotine dependence, cigarettes, with other nicotine-induced disorders: Secondary | ICD-10-CM | POA: Diagnosis not present

## 2022-07-13 DIAGNOSIS — E11621 Type 2 diabetes mellitus with foot ulcer: Secondary | ICD-10-CM | POA: Diagnosis not present

## 2022-07-13 DIAGNOSIS — I96 Gangrene, not elsewhere classified: Secondary | ICD-10-CM | POA: Diagnosis not present

## 2022-07-13 DIAGNOSIS — M79651 Pain in right thigh: Secondary | ICD-10-CM | POA: Diagnosis not present

## 2022-07-13 DIAGNOSIS — E860 Dehydration: Secondary | ICD-10-CM | POA: Diagnosis not present

## 2022-07-13 DIAGNOSIS — I10 Essential (primary) hypertension: Secondary | ICD-10-CM | POA: Diagnosis not present

## 2022-07-13 DIAGNOSIS — L03115 Cellulitis of right lower limb: Secondary | ICD-10-CM | POA: Diagnosis not present

## 2022-07-13 DIAGNOSIS — M009 Pyogenic arthritis, unspecified: Secondary | ICD-10-CM | POA: Diagnosis not present

## 2022-07-13 DIAGNOSIS — Z7984 Long term (current) use of oral hypoglycemic drugs: Secondary | ICD-10-CM | POA: Diagnosis not present

## 2022-07-13 DIAGNOSIS — R000 Tachycardia, unspecified: Secondary | ICD-10-CM | POA: Diagnosis not present

## 2022-07-14 DIAGNOSIS — E1136 Type 2 diabetes mellitus with diabetic cataract: Secondary | ICD-10-CM | POA: Diagnosis not present

## 2022-07-14 DIAGNOSIS — I96 Gangrene, not elsewhere classified: Secondary | ICD-10-CM | POA: Diagnosis not present

## 2022-07-14 DIAGNOSIS — E11628 Type 2 diabetes mellitus with other skin complications: Secondary | ICD-10-CM | POA: Diagnosis not present

## 2022-07-14 DIAGNOSIS — E1169 Type 2 diabetes mellitus with other specified complication: Secondary | ICD-10-CM | POA: Diagnosis not present

## 2022-07-14 DIAGNOSIS — M8610 Other acute osteomyelitis, unspecified site: Secondary | ICD-10-CM | POA: Diagnosis not present

## 2022-07-14 DIAGNOSIS — L97418 Non-pressure chronic ulcer of right heel and midfoot with other specified severity: Secondary | ICD-10-CM | POA: Diagnosis not present

## 2022-07-14 DIAGNOSIS — M869 Osteomyelitis, unspecified: Secondary | ICD-10-CM | POA: Diagnosis not present

## 2022-07-14 DIAGNOSIS — L97519 Non-pressure chronic ulcer of other part of right foot with unspecified severity: Secondary | ICD-10-CM | POA: Diagnosis not present

## 2022-07-14 DIAGNOSIS — E11621 Type 2 diabetes mellitus with foot ulcer: Secondary | ICD-10-CM | POA: Diagnosis not present

## 2022-07-14 DIAGNOSIS — M86171 Other acute osteomyelitis, right ankle and foot: Secondary | ICD-10-CM | POA: Diagnosis not present

## 2022-07-14 DIAGNOSIS — L97529 Non-pressure chronic ulcer of other part of left foot with unspecified severity: Secondary | ICD-10-CM | POA: Diagnosis not present

## 2022-07-15 DIAGNOSIS — E871 Hypo-osmolality and hyponatremia: Secondary | ICD-10-CM | POA: Diagnosis not present

## 2022-07-15 DIAGNOSIS — D72829 Elevated white blood cell count, unspecified: Secondary | ICD-10-CM | POA: Diagnosis not present

## 2022-07-15 DIAGNOSIS — Z89421 Acquired absence of other right toe(s): Secondary | ICD-10-CM | POA: Diagnosis not present

## 2022-07-15 DIAGNOSIS — E860 Dehydration: Secondary | ICD-10-CM | POA: Diagnosis not present

## 2022-07-15 DIAGNOSIS — E11621 Type 2 diabetes mellitus with foot ulcer: Secondary | ICD-10-CM | POA: Diagnosis not present

## 2022-07-15 DIAGNOSIS — L97519 Non-pressure chronic ulcer of other part of right foot with unspecified severity: Secondary | ICD-10-CM | POA: Diagnosis not present

## 2022-07-16 DIAGNOSIS — M86071 Acute hematogenous osteomyelitis, right ankle and foot: Secondary | ICD-10-CM | POA: Diagnosis not present

## 2022-07-16 DIAGNOSIS — Z720 Tobacco use: Secondary | ICD-10-CM | POA: Diagnosis not present

## 2022-07-16 DIAGNOSIS — E11621 Type 2 diabetes mellitus with foot ulcer: Secondary | ICD-10-CM | POA: Diagnosis not present

## 2022-07-17 DIAGNOSIS — M86071 Acute hematogenous osteomyelitis, right ankle and foot: Secondary | ICD-10-CM | POA: Diagnosis not present

## 2022-07-17 DIAGNOSIS — Z720 Tobacco use: Secondary | ICD-10-CM | POA: Diagnosis not present

## 2022-07-17 DIAGNOSIS — E11621 Type 2 diabetes mellitus with foot ulcer: Secondary | ICD-10-CM | POA: Diagnosis not present

## 2022-07-18 DIAGNOSIS — E1165 Type 2 diabetes mellitus with hyperglycemia: Secondary | ICD-10-CM | POA: Diagnosis not present

## 2022-07-18 DIAGNOSIS — E11621 Type 2 diabetes mellitus with foot ulcer: Secondary | ICD-10-CM | POA: Diagnosis not present

## 2022-07-18 DIAGNOSIS — M86071 Acute hematogenous osteomyelitis, right ankle and foot: Secondary | ICD-10-CM | POA: Diagnosis not present

## 2022-08-26 DIAGNOSIS — E11621 Type 2 diabetes mellitus with foot ulcer: Secondary | ICD-10-CM | POA: Diagnosis not present

## 2022-08-26 DIAGNOSIS — Z794 Long term (current) use of insulin: Secondary | ICD-10-CM | POA: Diagnosis not present

## 2022-08-26 DIAGNOSIS — E871 Hypo-osmolality and hyponatremia: Secondary | ICD-10-CM | POA: Diagnosis not present

## 2022-08-26 DIAGNOSIS — G546 Phantom limb syndrome with pain: Secondary | ICD-10-CM | POA: Diagnosis not present

## 2022-08-26 DIAGNOSIS — R918 Other nonspecific abnormal finding of lung field: Secondary | ICD-10-CM | POA: Diagnosis not present

## 2022-08-26 DIAGNOSIS — A419 Sepsis, unspecified organism: Secondary | ICD-10-CM | POA: Diagnosis not present

## 2022-08-26 DIAGNOSIS — E1165 Type 2 diabetes mellitus with hyperglycemia: Secondary | ICD-10-CM | POA: Diagnosis not present

## 2022-08-26 DIAGNOSIS — L97519 Non-pressure chronic ulcer of other part of right foot with unspecified severity: Secondary | ICD-10-CM | POA: Diagnosis not present

## 2022-08-26 DIAGNOSIS — E669 Obesity, unspecified: Secondary | ICD-10-CM | POA: Diagnosis not present

## 2022-08-26 DIAGNOSIS — M86171 Other acute osteomyelitis, right ankle and foot: Secondary | ICD-10-CM | POA: Diagnosis not present

## 2022-08-26 DIAGNOSIS — Z6833 Body mass index (BMI) 33.0-33.9, adult: Secondary | ICD-10-CM | POA: Diagnosis not present

## 2022-08-26 DIAGNOSIS — Z91199 Patient's noncompliance with other medical treatment and regimen due to unspecified reason: Secondary | ICD-10-CM | POA: Diagnosis not present

## 2022-08-26 DIAGNOSIS — R0902 Hypoxemia: Secondary | ICD-10-CM | POA: Diagnosis not present

## 2022-08-26 DIAGNOSIS — M86071 Acute hematogenous osteomyelitis, right ankle and foot: Secondary | ICD-10-CM | POA: Diagnosis not present

## 2022-08-26 DIAGNOSIS — Z89421 Acquired absence of other right toe(s): Secondary | ICD-10-CM | POA: Diagnosis not present

## 2022-08-26 DIAGNOSIS — M79671 Pain in right foot: Secondary | ICD-10-CM | POA: Diagnosis not present

## 2022-08-26 DIAGNOSIS — M7989 Other specified soft tissue disorders: Secondary | ICD-10-CM | POA: Diagnosis not present

## 2022-08-26 DIAGNOSIS — F17210 Nicotine dependence, cigarettes, uncomplicated: Secondary | ICD-10-CM | POA: Diagnosis not present

## 2022-08-26 DIAGNOSIS — E1169 Type 2 diabetes mellitus with other specified complication: Secondary | ICD-10-CM | POA: Diagnosis not present

## 2022-08-26 DIAGNOSIS — Z89512 Acquired absence of left leg below knee: Secondary | ICD-10-CM | POA: Diagnosis not present

## 2022-08-26 DIAGNOSIS — G8929 Other chronic pain: Secondary | ICD-10-CM | POA: Diagnosis not present

## 2022-08-27 DIAGNOSIS — E1165 Type 2 diabetes mellitus with hyperglycemia: Secondary | ICD-10-CM | POA: Diagnosis not present

## 2022-08-27 DIAGNOSIS — M868X7 Other osteomyelitis, ankle and foot: Secondary | ICD-10-CM | POA: Diagnosis not present

## 2022-08-27 DIAGNOSIS — L02611 Cutaneous abscess of right foot: Secondary | ICD-10-CM | POA: Diagnosis not present

## 2022-08-27 DIAGNOSIS — M869 Osteomyelitis, unspecified: Secondary | ICD-10-CM | POA: Diagnosis not present

## 2022-08-27 DIAGNOSIS — E669 Obesity, unspecified: Secondary | ICD-10-CM | POA: Diagnosis not present

## 2022-08-27 DIAGNOSIS — F17218 Nicotine dependence, cigarettes, with other nicotine-induced disorders: Secondary | ICD-10-CM | POA: Diagnosis not present

## 2022-08-27 DIAGNOSIS — M86071 Acute hematogenous osteomyelitis, right ankle and foot: Secondary | ICD-10-CM | POA: Diagnosis not present

## 2022-08-27 DIAGNOSIS — M00871 Arthritis due to other bacteria, right ankle and foot: Secondary | ICD-10-CM | POA: Diagnosis not present

## 2022-08-27 DIAGNOSIS — G546 Phantom limb syndrome with pain: Secondary | ICD-10-CM | POA: Diagnosis not present

## 2022-08-28 DIAGNOSIS — L97419 Non-pressure chronic ulcer of right heel and midfoot with unspecified severity: Secondary | ICD-10-CM | POA: Diagnosis not present

## 2022-08-28 DIAGNOSIS — E1169 Type 2 diabetes mellitus with other specified complication: Secondary | ICD-10-CM | POA: Diagnosis not present

## 2022-08-28 DIAGNOSIS — M869 Osteomyelitis, unspecified: Secondary | ICD-10-CM | POA: Diagnosis not present

## 2022-08-28 DIAGNOSIS — E11621 Type 2 diabetes mellitus with foot ulcer: Secondary | ICD-10-CM | POA: Diagnosis not present

## 2022-08-28 DIAGNOSIS — Z79899 Other long term (current) drug therapy: Secondary | ICD-10-CM | POA: Diagnosis not present

## 2022-08-28 DIAGNOSIS — F17200 Nicotine dependence, unspecified, uncomplicated: Secondary | ICD-10-CM | POA: Diagnosis not present

## 2022-08-28 DIAGNOSIS — M86071 Acute hematogenous osteomyelitis, right ankle and foot: Secondary | ICD-10-CM | POA: Diagnosis not present

## 2022-08-28 DIAGNOSIS — Z792 Long term (current) use of antibiotics: Secondary | ICD-10-CM | POA: Diagnosis not present

## 2022-08-29 DIAGNOSIS — Z792 Long term (current) use of antibiotics: Secondary | ICD-10-CM | POA: Diagnosis not present

## 2022-08-29 DIAGNOSIS — E11628 Type 2 diabetes mellitus with other skin complications: Secondary | ICD-10-CM | POA: Diagnosis not present

## 2022-08-29 DIAGNOSIS — Z720 Tobacco use: Secondary | ICD-10-CM | POA: Diagnosis not present

## 2022-08-29 DIAGNOSIS — E119 Type 2 diabetes mellitus without complications: Secondary | ICD-10-CM | POA: Diagnosis not present

## 2022-08-29 DIAGNOSIS — M869 Osteomyelitis, unspecified: Secondary | ICD-10-CM | POA: Diagnosis not present

## 2022-08-29 DIAGNOSIS — L02611 Cutaneous abscess of right foot: Secondary | ICD-10-CM | POA: Diagnosis not present

## 2022-08-29 DIAGNOSIS — M86071 Acute hematogenous osteomyelitis, right ankle and foot: Secondary | ICD-10-CM | POA: Diagnosis not present

## 2022-08-31 ENCOUNTER — Telehealth: Payer: Self-pay | Admitting: *Deleted

## 2022-08-31 NOTE — Transitions of Care (Post Inpatient/ED Visit) (Signed)
08/31/2022  Name: [name] Day. MRN: [id number] DOB: June 10, 1960  Today's TOC FU Call Status: Today's TOC FU Call Status:: Successful TOC FU Call Competed TOC FU Call Complete Date: 08/31/22  Transition Care Management Follow-up Telephone Call Date of Discharge: 08/29/22 Discharge Facility: Other [facility]) Name of Other (Non[facility]) Discharge Facility: [facility] Type of Discharge: Inpatient Admission Primary Inpatient Discharge Diagnosis:: Osteomyelitis How have you been since you were released from the hospital?: Same Any questions or concerns?: No  Items Reviewed: Did you receive and understand the discharge instructions provided?: Yes Medications obtained,verified, and reconciled?: Yes (Medications Reviewed) Any new allergies since your discharge?: No Dietary orders reviewed?: Yes Type of Diet Ordered:: diabetic diet/low carb Do you have support at home?: Yes People in Home: significant other Name of Support/Comfort Primary Source: Girlfriend and Father  Medications Reviewed Today: Medications Reviewed Today     Reviewed by Heidi Dach, RN (Registered Nurse) on 08/31/22 at 1547  Med List Status: <None>   Medication Order Taking? Sig Documenting Provider Last Dose Status Informant  amoxicillin-clavulanate (AUGMENTIN) 875-125 MG tablet [id number] Yes Take 1 tablet by mouth 2 (two) times daily. [provider] Taking Active   doxycycline (VIBRAMYCIN) 100 MG capsule [id number] Yes Take 1 capsule (100 mg total) by mouth 2 (two) times daily. Lorre Munroe, NP Taking Active   empagliflozin (JARDIANCE) 10 MG TABS tablet [id number] Yes Take 1 tablet (10 mg total) by mouth daily before breakfast. Larae Grooms, NP Taking Active   gabapentin (NEURONTIN) 600 MG tablet [id number] No Take 1 tablet (600 mg total) by mouth 2 (two) times daily. 1200 mg  BID  Patient not taking: Reported on 08/31/2022   Larae Grooms, NP Not Taking Active   metFORMIN  (GLUCOPHAGE) 500 MG tablet [id number] Yes Take 1 tablet (500 mg total) by mouth 2 (two) times daily with a meal. Becky Augusta, NP Taking Active Self  valsartan (DIOVAN) 80 MG tablet [id number] Yes Take 1 tablet (80 mg total) by mouth daily. Larae Grooms, NP Taking Active   Med List Note Valerie Salts, RN 05/07/19 1430): UDS  06/04/18 MR 09/02/18 Medication agreement signed 06/04/18 Oxycodone 10 mg Pa sent 05-07-2019  [facility] and Equipment/Supplies: Were Home Health Services Ordered?: No Any new equipment or medical supplies ordered?: No  Functional Questionnaire: Do you need assistance with bathing/showering or dressing?: No Do you need assistance with meal preparation?: No Do you need assistance with eating?: No Do you have difficulty maintaining continence: No Do you need assistance with getting out of bed/getting out of a chair/moving?: No Do you have difficulty managing or taking your medications?: No  Follow up appointments reviewed: PCP Follow-up appointment confirmed?: No (Patient will call to schedule with PCP) Specialist Hospital Follow-up appointment confirmed?: Yes Date of Specialist follow-up appointment?: 09/06/22 Follow-Up Specialty Provider:: Vascular Surgeon for Right BKA Do you need transportation to your follow-up appointment?: No Do you understand care options if your condition(s) worsen?: Yes-patient verbalized understanding  SDOH Interventions Today    Flowsheet Row Most Recent Value  SDOH Interventions   Transportation Interventions Intervention Not Indicated      The patient was given information about care management services as a benefit of their Medicaid health plan today.    Patient  agreed to services and verbal consent obtained.   RNCM initial visit scheduled on 09/16/22 @ 9am.  [name] RN, BSN [place]  Managed [facility] RN Care Coordinator [phone]

## 2022-09-01 DIAGNOSIS — E1165 Type 2 diabetes mellitus with hyperglycemia: Secondary | ICD-10-CM | POA: Diagnosis not present

## 2022-09-01 DIAGNOSIS — L089 Local infection of the skin and subcutaneous tissue, unspecified: Secondary | ICD-10-CM | POA: Diagnosis not present

## 2022-09-01 DIAGNOSIS — E11628 Type 2 diabetes mellitus with other skin complications: Secondary | ICD-10-CM | POA: Diagnosis not present

## 2022-09-01 DIAGNOSIS — F17200 Nicotine dependence, unspecified, uncomplicated: Secondary | ICD-10-CM | POA: Diagnosis not present

## 2022-09-02 DIAGNOSIS — L089 Local infection of the skin and subcutaneous tissue, unspecified: Secondary | ICD-10-CM | POA: Diagnosis not present

## 2022-09-02 DIAGNOSIS — E11628 Type 2 diabetes mellitus with other skin complications: Secondary | ICD-10-CM | POA: Diagnosis not present

## 2022-09-06 DIAGNOSIS — G8918 Other acute postprocedural pain: Secondary | ICD-10-CM | POA: Diagnosis not present

## 2022-09-06 DIAGNOSIS — E1169 Type 2 diabetes mellitus with other specified complication: Secondary | ICD-10-CM | POA: Diagnosis not present

## 2022-09-06 DIAGNOSIS — M869 Osteomyelitis, unspecified: Secondary | ICD-10-CM | POA: Diagnosis not present

## 2022-09-06 HISTORY — PX: LEG AMPUTATION BELOW KNEE: SHX694

## 2022-09-08 DIAGNOSIS — Z008 Encounter for other general examination: Secondary | ICD-10-CM | POA: Diagnosis not present

## 2022-09-16 ENCOUNTER — Other Ambulatory Visit: Payer: Medicaid Other | Admitting: *Deleted

## 2022-09-16 NOTE — Patient Outreach (Signed)
[name] Day. is currently admitted as an inpatient at  [facility] . [facility] Managed Care team will follow the progress of [name] Day. and follow up upon discharge.   [name] RN, BSN [place]  Managed [facility] RN Care Coordinator [phone]

## 2022-09-19 ENCOUNTER — Telehealth: Payer: Self-pay | Admitting: *Deleted

## 2022-09-19 NOTE — Transitions of Care (Post Inpatient/ED Visit) (Signed)
   09/19/2022  Name: [name] Day. MRN: [id number] DOB: [birth year]  Today's TOC FU Call Status: Today's TOC FU Call Status:: Unsuccessul Call (1st Attempt) Unsuccessful Call (1st Attempt) Date: 09/19/22  Attempted to reach the patient regarding the most recent Inpatient/ED visit.  Follow Up Plan: Additional outreach attempts will be made to reach the patient to complete the Transitions of Care (Post Inpatient/ED visit) call.   [name] RN, BSN [place]  Managed [facility] RN Care Coordinator [phone]

## 2022-09-21 DIAGNOSIS — Z89511 Acquired absence of right leg below knee: Secondary | ICD-10-CM | POA: Diagnosis not present

## 2022-09-21 DIAGNOSIS — Z79899 Other long term (current) drug therapy: Secondary | ICD-10-CM | POA: Diagnosis not present

## 2022-09-21 DIAGNOSIS — Z886 Allergy status to analgesic agent status: Secondary | ICD-10-CM | POA: Diagnosis not present

## 2022-09-21 DIAGNOSIS — F410 Panic disorder [episodic paroxysmal anxiety] without agoraphobia: Secondary | ICD-10-CM | POA: Diagnosis not present

## 2022-09-21 DIAGNOSIS — Z7982 Long term (current) use of aspirin: Secondary | ICD-10-CM | POA: Diagnosis not present

## 2022-09-21 DIAGNOSIS — F32A Depression, unspecified: Secondary | ICD-10-CM | POA: Diagnosis not present

## 2022-09-21 DIAGNOSIS — I10 Essential (primary) hypertension: Secondary | ICD-10-CM | POA: Diagnosis not present

## 2022-09-21 DIAGNOSIS — F17210 Nicotine dependence, cigarettes, uncomplicated: Secondary | ICD-10-CM | POA: Diagnosis not present

## 2022-09-21 DIAGNOSIS — Z5321 Procedure and treatment not carried out due to patient leaving prior to being seen by health care provider: Secondary | ICD-10-CM | POA: Diagnosis not present

## 2022-09-21 DIAGNOSIS — Z66 Do not resuscitate: Secondary | ICD-10-CM | POA: Diagnosis not present

## 2022-09-21 DIAGNOSIS — Z7984 Long term (current) use of oral hypoglycemic drugs: Secondary | ICD-10-CM | POA: Diagnosis not present

## 2022-09-21 DIAGNOSIS — E119 Type 2 diabetes mellitus without complications: Secondary | ICD-10-CM | POA: Diagnosis not present

## 2022-09-21 DIAGNOSIS — T8743 Infection of amputation stump, right lower extremity: Secondary | ICD-10-CM | POA: Diagnosis not present

## 2022-09-21 DIAGNOSIS — M79661 Pain in right lower leg: Secondary | ICD-10-CM | POA: Diagnosis not present

## 2022-09-28 DIAGNOSIS — T8743 Infection of amputation stump, right lower extremity: Secondary | ICD-10-CM | POA: Diagnosis not present

## 2022-09-28 DIAGNOSIS — Z6832 Body mass index (BMI) 32.0-32.9, adult: Secondary | ICD-10-CM | POA: Diagnosis not present

## 2022-09-28 DIAGNOSIS — Z66 Do not resuscitate: Secondary | ICD-10-CM | POA: Diagnosis not present

## 2022-09-28 DIAGNOSIS — Z7982 Long term (current) use of aspirin: Secondary | ICD-10-CM | POA: Diagnosis not present

## 2022-09-28 DIAGNOSIS — T8789 Other complications of amputation stump: Secondary | ICD-10-CM | POA: Diagnosis not present

## 2022-09-28 DIAGNOSIS — I10 Essential (primary) hypertension: Secondary | ICD-10-CM | POA: Diagnosis not present

## 2022-09-28 DIAGNOSIS — Z89511 Acquired absence of right leg below knee: Secondary | ICD-10-CM | POA: Diagnosis not present

## 2022-09-28 DIAGNOSIS — F17210 Nicotine dependence, cigarettes, uncomplicated: Secondary | ICD-10-CM | POA: Diagnosis not present

## 2022-09-28 DIAGNOSIS — E119 Type 2 diabetes mellitus without complications: Secondary | ICD-10-CM | POA: Diagnosis not present

## 2022-09-28 DIAGNOSIS — Z885 Allergy status to narcotic agent status: Secondary | ICD-10-CM | POA: Diagnosis not present

## 2022-09-28 DIAGNOSIS — E669 Obesity, unspecified: Secondary | ICD-10-CM | POA: Diagnosis not present

## 2022-09-28 DIAGNOSIS — Z5941 Food insecurity: Secondary | ICD-10-CM | POA: Diagnosis not present

## 2022-09-28 DIAGNOSIS — R600 Localized edema: Secondary | ICD-10-CM | POA: Diagnosis not present

## 2022-09-28 DIAGNOSIS — G8929 Other chronic pain: Secondary | ICD-10-CM | POA: Diagnosis not present

## 2022-09-28 DIAGNOSIS — E785 Hyperlipidemia, unspecified: Secondary | ICD-10-CM | POA: Diagnosis not present

## 2022-09-28 DIAGNOSIS — Z7984 Long term (current) use of oral hypoglycemic drugs: Secondary | ICD-10-CM | POA: Diagnosis not present

## 2022-09-28 DIAGNOSIS — F32A Depression, unspecified: Secondary | ICD-10-CM | POA: Diagnosis not present

## 2022-09-28 DIAGNOSIS — F419 Anxiety disorder, unspecified: Secondary | ICD-10-CM | POA: Diagnosis not present

## 2022-09-30 ENCOUNTER — Telehealth: Payer: Self-pay | Admitting: *Deleted

## 2022-09-30 NOTE — Transitions of Care (Post Inpatient/ED Visit) (Signed)
   09/30/2022  Name: [name] Day. MRN: [id number] DOB: 09/23/1960  Today's TOC FU Call Status: Today's TOC FU Call Status:: Unsuccessul Call (1st Attempt) Unsuccessful Call (1st Attempt) Date: 09/30/22  Attempted to reach the patient regarding the most recent Inpatient/ED visit.  Follow Up Plan: Additional outreach attempts will be made to reach the patient to complete the Transitions of Care (Post Inpatient/ED visit) call.   [name] RN, BSN [place]  Managed [facility] RN Care Coordinator [phone]

## 2022-10-07 DIAGNOSIS — Z09 Encounter for follow-up examination after completed treatment for conditions other than malignant neoplasm: Secondary | ICD-10-CM | POA: Diagnosis not present

## 2022-10-07 DIAGNOSIS — S88111A Complete traumatic amputation at level between knee and ankle, right lower leg, initial encounter: Secondary | ICD-10-CM | POA: Diagnosis not present

## 2022-10-17 IMAGING — CR DG CHEST 2V
2 series · 4 of 4 positions shown · non-contrast
Comparison: 01/01/2017

CLINICAL DATA: Cough

Shortness of breath
Smoker
EXAM:
CHEST - 2 VIEW

[Series 1: chest pa · 0.14mm/px · 2 of 2 slices shown]
[im 1/2]
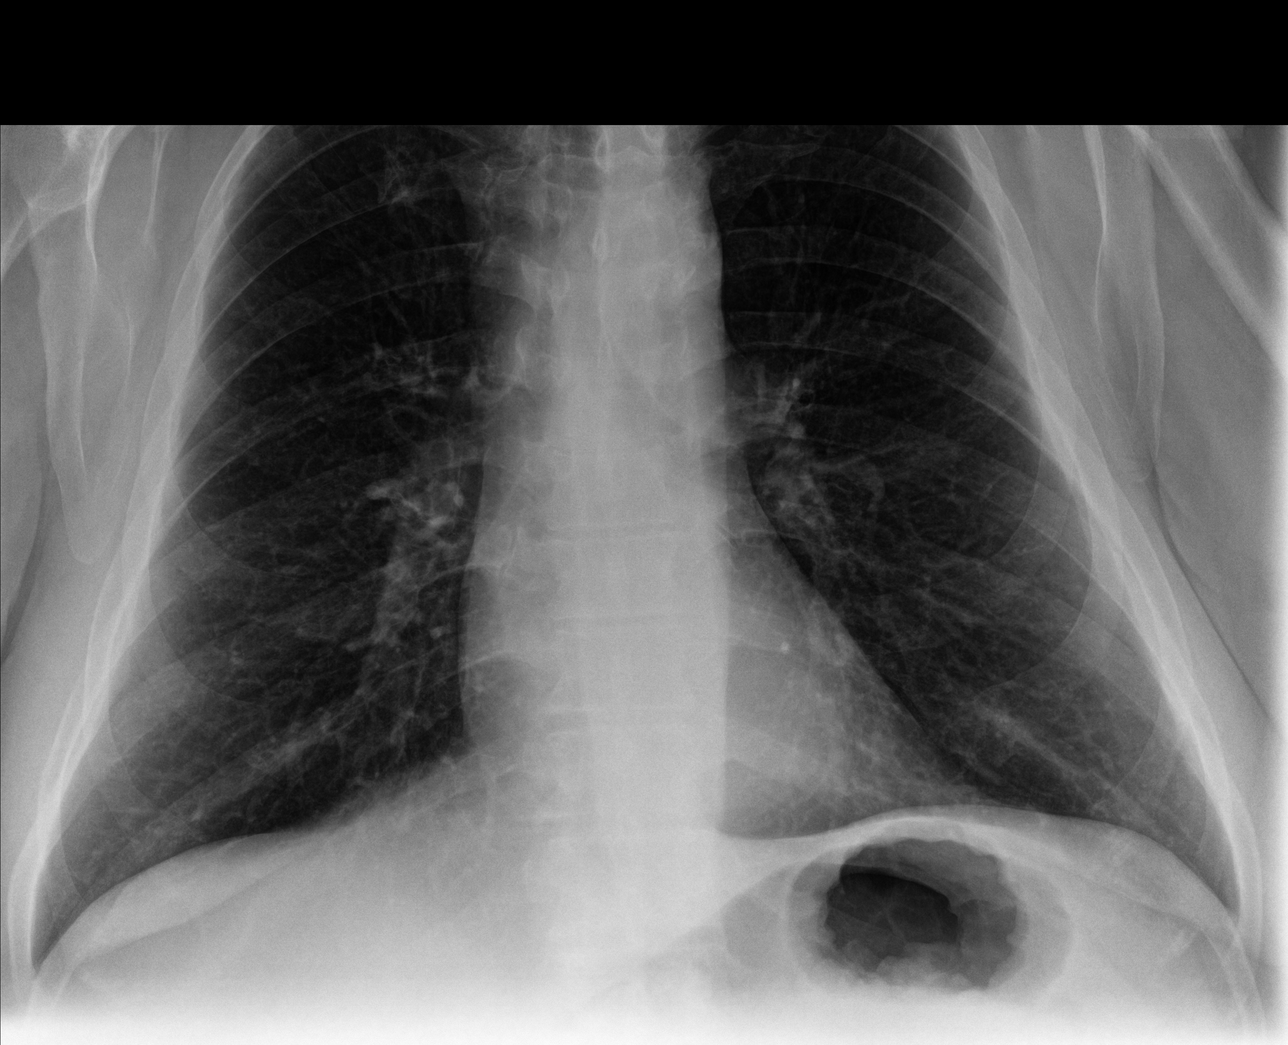
[im 2/2]
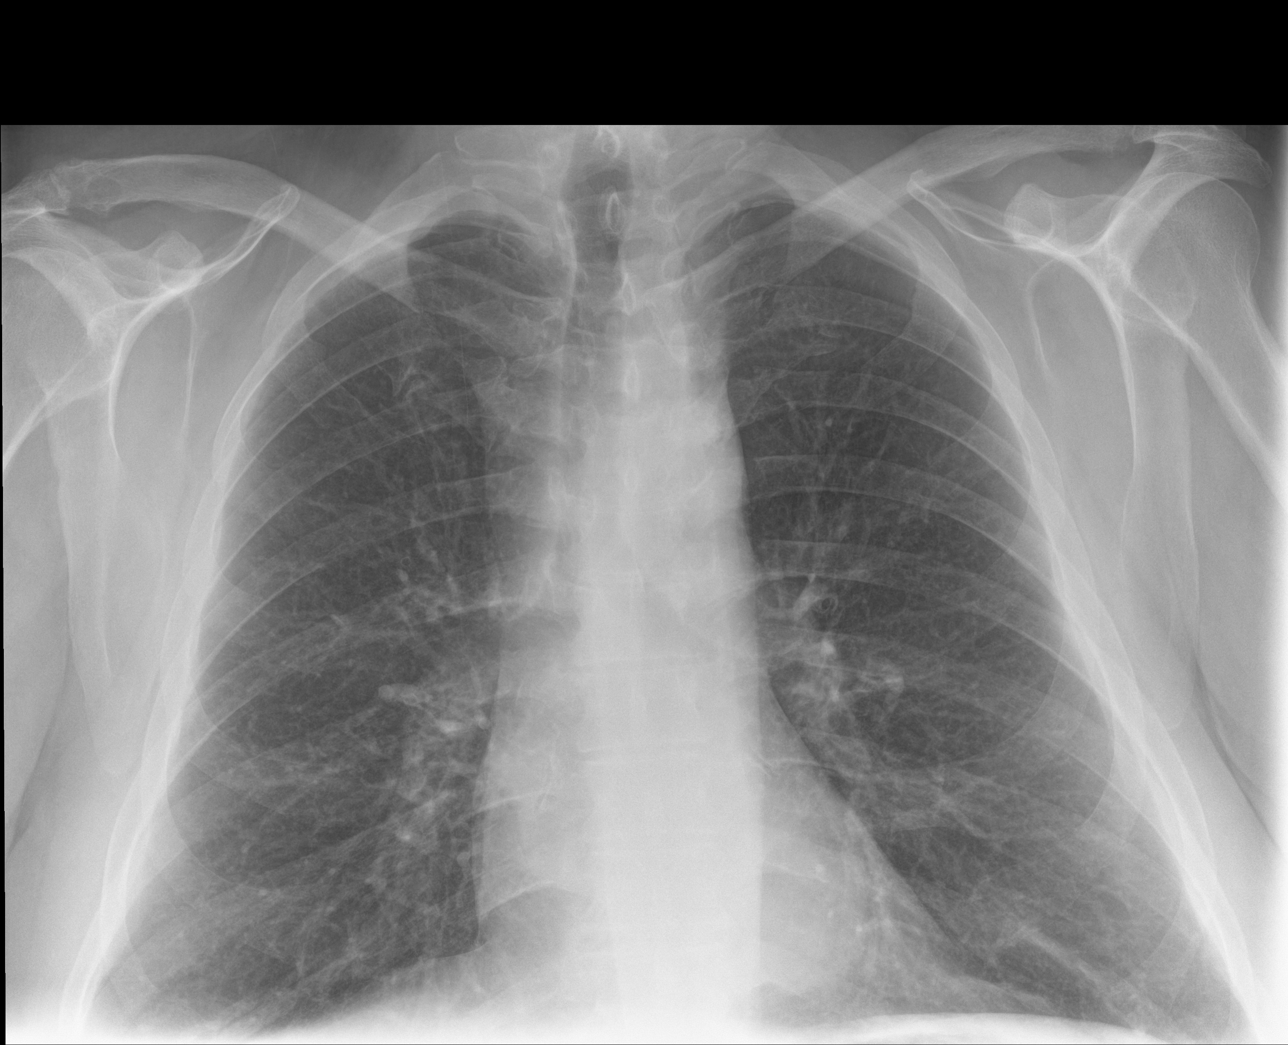

[Series 2: chest lat · 0.14mm/px · 2 of 2 slices shown]
[im 1/2]
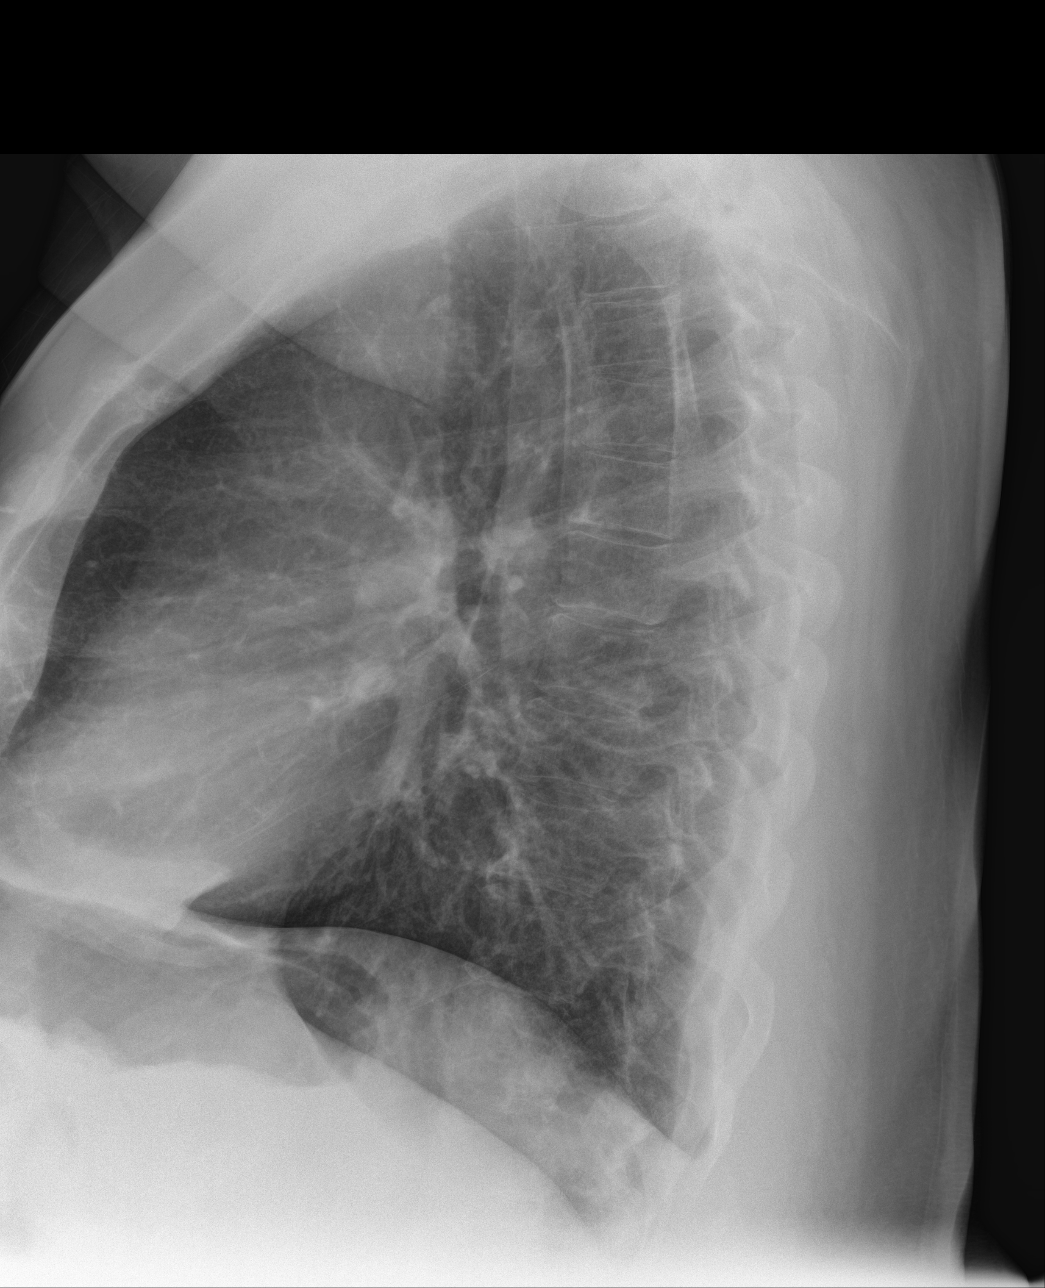
[im 2/2]
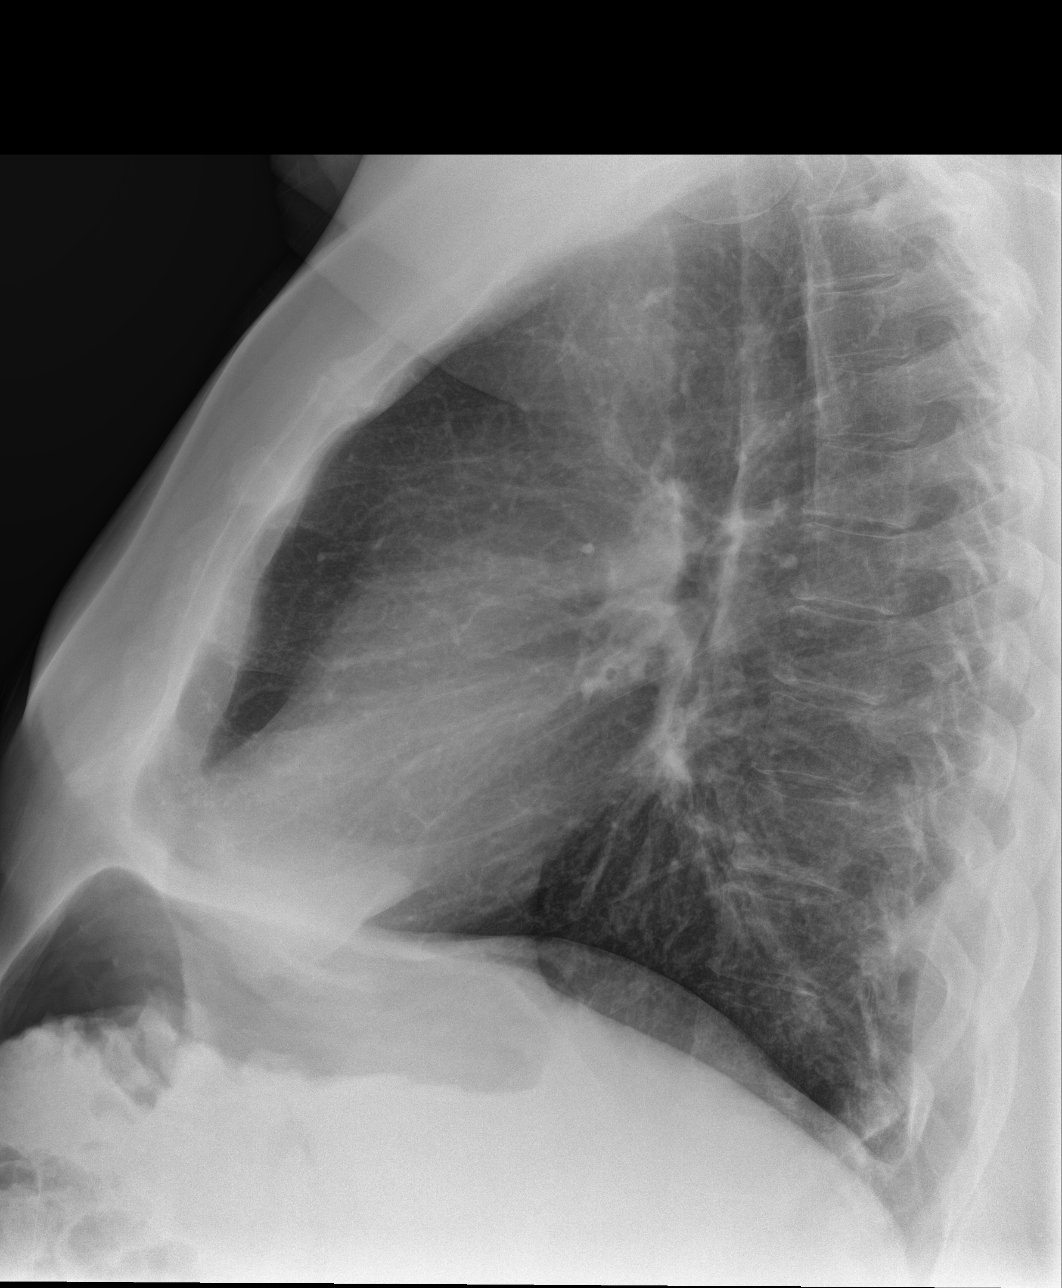

[4 of 4 positions shown; findings below may reference images not displayed]

FINDINGS: Cardiomediastinal silhouette and pulmonary vasculature are within
normal limits.

Lungs are hyperexpanded, but otherwise clear clear.

Interval development of a 11 mm lucent lesion in the lateral head of
the right clavicle.
IMPRESSION: 1. No acute cardiopulmonary process
2. Emphysema
3. Interval development of an 11 mm lucent lesion in the lateral
head of the right clavicle. While this may be the result of
degenerative disease, a lytic lesion related to metastatic disease
or multiple myeloma is not excluded.

## 2022-10-28 DIAGNOSIS — Z713 Dietary counseling and surveillance: Secondary | ICD-10-CM | POA: Diagnosis not present

## 2022-10-28 DIAGNOSIS — Z6832 Body mass index (BMI) 32.0-32.9, adult: Secondary | ICD-10-CM | POA: Diagnosis not present

## 2022-10-28 DIAGNOSIS — E1151 Type 2 diabetes mellitus with diabetic peripheral angiopathy without gangrene: Secondary | ICD-10-CM | POA: Diagnosis not present

## 2022-10-28 DIAGNOSIS — Z79899 Other long term (current) drug therapy: Secondary | ICD-10-CM | POA: Diagnosis not present

## 2022-10-28 DIAGNOSIS — S88111D Complete traumatic amputation at level between knee and ankle, right lower leg, subsequent encounter: Secondary | ICD-10-CM | POA: Diagnosis not present

## 2022-10-28 DIAGNOSIS — Z7984 Long term (current) use of oral hypoglycemic drugs: Secondary | ICD-10-CM | POA: Diagnosis not present

## 2022-10-28 DIAGNOSIS — I10 Essential (primary) hypertension: Secondary | ICD-10-CM | POA: Diagnosis not present

## 2022-10-28 DIAGNOSIS — S88112D Complete traumatic amputation at level between knee and ankle, left lower leg, subsequent encounter: Secondary | ICD-10-CM | POA: Diagnosis not present

## 2022-11-10 DIAGNOSIS — Z89511 Acquired absence of right leg below knee: Secondary | ICD-10-CM | POA: Diagnosis not present

## 2022-12-11 DIAGNOSIS — Z89511 Acquired absence of right leg below knee: Secondary | ICD-10-CM | POA: Diagnosis not present

## 2022-12-19 ENCOUNTER — Ambulatory Visit (INDEPENDENT_AMBULATORY_CARE_PROVIDER_SITE_OTHER): Payer: Medicaid Other | Admitting: Family Medicine

## 2022-12-19 ENCOUNTER — Encounter: Payer: Self-pay | Admitting: Family Medicine

## 2022-12-19 VITALS — BP 130/82 | HR 118

## 2022-12-19 DIAGNOSIS — Z89511 Acquired absence of right leg below knee: Secondary | ICD-10-CM | POA: Diagnosis not present

## 2022-12-19 DIAGNOSIS — M25861 Other specified joint disorders, right knee: Secondary | ICD-10-CM

## 2022-12-19 DIAGNOSIS — Z89512 Acquired absence of left leg below knee: Secondary | ICD-10-CM | POA: Diagnosis not present

## 2022-12-19 DIAGNOSIS — M25461 Effusion, right knee: Secondary | ICD-10-CM

## 2022-12-19 MED ORDER — DOXYCYCLINE HYCLATE 100 MG PO TABS: 100.0000 mg | ORAL_TABLET | Freq: Two times a day (BID) | ORAL | 0 refills | Status: AC

## 2022-12-19 NOTE — Progress Notes (Signed)
BP 130/82   Pulse (!) 118   SpO2 95%    Subjective:    Patient ID: [name] Park., male    DOB: May 15, 1960, 62 y.o.   MRN: [id number]  HPI: [name] Flint. is a 62 y.o. male  Chief Complaint  Patient presents with   Orders    Patient is here to discuss order with provider for an Prosthetic.  Patient says our office should have received some paperwork on file from his previous order for his other leg. Patient would like to discuss with provider at today's visit. R Leg recently amputated on 09/06/22.    OSTEOMYELITIS S/P RIGHT BKA W PREVIOUS S/P LEFT BKA Patient complains of x1 incident that occurred one week ago where he fell out of his wheelchair and landed on concrete, obtained swelling on his right knee. He admits to constant pain 3/10, worsened with movement. He has tried ice and heat, denies relief but eased off pain a little.Denies drainage   Today he is requesting a prescription for orthotic device for right BKA and new device for left BKA. He was told by [facility], that he would need a prescription for these devices from his primary care provider. He is requesting a entire casting and device for the right and re casting, sleeve, and liners for the left.    Relevant past medical, surgical, family and social history reviewed and updated as indicated. Interim medical history since our last visit reviewed. Allergies and medications reviewed and updated.  Review of Systems  Constitutional:  Negative for fever.  Respiratory: Negative.    Cardiovascular: Negative.   Musculoskeletal:  Positive for joint swelling.  Skin:  Positive for color change.       Erythema of right knee    Per HPI unless specifically indicated above     Objective:    BP 130/82   Pulse (!) 118   SpO2 95%   Wt Readings from Last 3 Encounters:  03/06/22 263 lb (119.3 kg)  01/24/22 274 lb 4.8 oz (124.4 kg)  01/09/22 263 lb 0.1 oz (119.3 kg)    Physical Exam Vitals and  nursing note reviewed.  Constitutional:      General: He is awake. He is not in acute distress.    Appearance: Normal appearance. He is well-developed and well-groomed. He is not ill-appearing.  HENT:     Head: Normocephalic and atraumatic.     Right Ear: Hearing and external ear normal. No drainage.     Left Ear: Hearing and external ear normal. No drainage.     Nose: Nose normal.  Eyes:     General: Lids are normal.        Right eye: No discharge.        Left eye: No discharge.     Conjunctiva/sclera: Conjunctivae normal.  Cardiovascular:     Rate and Rhythm: Normal rate and regular rhythm.     Pulses:          Radial pulses are 2+ on the right side and 2+ on the left side.       Popliteal pulses are 2+ on the right side and 2+ on the left side.     Heart sounds: Normal heart sounds, S1 normal and S2 normal. No murmur heard.    No gallop.  Pulmonary:     Effort: Pulmonary effort is normal. No accessory muscle usage or respiratory distress.     Breath sounds: Normal breath sounds.  Musculoskeletal:  General: Normal range of motion.     Cervical back: Full passive range of motion without pain and normal range of motion.     Right knee: Swelling, effusion and erythema present. Tenderness present.     Right lower leg: No edema.     Left lower leg: No edema.     Right Lower Extremity: Right leg is amputated below knee.     Left Lower Extremity: Left leg is amputated below knee.  Skin:    General: Skin is warm and dry.     Capillary Refill: Capillary refill takes less than 2 seconds.  Neurological:     Mental Status: He is alert and oriented to person, place, and time.  Psychiatric:        Attention and Perception: Attention normal.        Mood and Affect: Mood normal.        Speech: Speech normal.        Behavior: Behavior normal. Behavior is cooperative.        Thought Content: Thought content normal.     Results for orders placed or performed in visit on 10/06/21   Comp Met (CMET)  Result Value Ref Range   Glucose 168 (H) 70 - 99 mg/dL   BUN 10 6 - 24 mg/dL   Creatinine, Ser 5.40 0.76 - 1.27 mg/dL   eGFR 981 >19 JY/NWG/9.56   BUN/Creatinine Ratio 12 9 - 20   Sodium 136 134 - 144 mmol/L   Potassium 4.5 3.5 - 5.2 mmol/L   Chloride 100 96 - 106 mmol/L   CO2 21 20 - 29 mmol/L   Calcium 9.8 8.7 - 10.2 mg/dL   Total Protein 7.3 6.0 - 8.5 g/dL   Albumin 4.4 3.8 - 4.9 g/dL   Globulin, Total 2.9 1.5 - 4.5 g/dL   Albumin/Globulin Ratio 1.5 1.2 - 2.2   Bilirubin Total 0.2 0.0 - 1.2 mg/dL   Alkaline Phosphatase 75 44 - 121 IU/L   AST 17 0 - 40 IU/L   ALT 19 0 - 44 IU/L  Lipid Profile  Result Value Ref Range   Cholesterol, Total 197 100 - 199 mg/dL   Triglycerides 213 (H) 0 - 149 mg/dL   HDL 31 (L) >08 mg/dL   VLDL Cholesterol Cal 49 (H) 5 - 40 mg/dL   LDL Chol Calc (NIH) 657 (H) 0 - 99 mg/dL   Chol/HDL Ratio 6.4 (H) 0.0 - 5.0 ratio  HgB A1c  Result Value Ref Range   Hgb A1c MFr Bld 8.2 (H) 4.8 - 5.6 %   Est. average glucose Bld gHb Est-mCnc 189 mg/dL  CBC w/Diff  Result Value Ref Range   WBC 13.1 (H) 3.4 - 10.8 x10E3/uL   RBC 5.66 4.14 - 5.80 x10E6/uL   Hemoglobin 16.5 13.0 - 17.7 g/dL   Hematocrit 84.6 96.2 - 51.0 %   MCV 87 79 - 97 fL   MCH 29.2 26.6 - 33.0 pg   MCHC 33.7 31.5 - 35.7 g/dL   RDW 95.2 84.1 - 32.4 %   Platelets 249 150 - 450 x10E3/uL   Neutrophils 65 Not Estab. %   Lymphs 28 Not Estab. %   Monocytes 4 Not Estab. %   Eos 1 Not Estab. %   Basos 1 Not Estab. %   Neutrophils Absolute 8.6 (H) 1.4 - 7.0 x10E3/uL   Lymphocytes Absolute 3.6 (H) 0.7 - 3.1 x10E3/uL   Monocytes Absolute 0.6 0.1 - 0.9 x10E3/uL   EOS (ABSOLUTE)  0.2 0.0 - 0.4 x10E3/uL   Basophils Absolute 0.1 0.0 - 0.2 x10E3/uL   Immature Granulocytes 1 Not Estab. %   Immature Grans (Abs) 0.1 0.0 - 0.1 x10E3/uL      Assessment & Plan:   Problem List Items Addressed This Visit     S/P BKA (below knee amputation) unilateral, left (HCC) - Primary   Relevant  Orders   For home use only DME Other see comment   Other Visit Diagnoses     S/P BKA (below knee amputation), right (HCC)       Acute, ongoing. Order placed for prosthetic device.   Relevant Orders   For home use only DME Other see comment   Effusion of bursa of knee, right       Acute, ongoing. Doxycycline BID for 10 days, referral placed for orthopedic surgery for futher management.   Relevant Orders   Ambulatory referral to Orthopedic Surgery        Follow up plan: Return in about 1 month (around 01/18/2023) for Follow up, BP Recheck.

## 2023-01-10 DIAGNOSIS — Z89511 Acquired absence of right leg below knee: Secondary | ICD-10-CM | POA: Diagnosis not present

## 2023-01-19 ENCOUNTER — Ambulatory Visit: Payer: Medicaid Other | Admitting: Family Medicine

## 2023-02-08 DIAGNOSIS — Z89511 Acquired absence of right leg below knee: Secondary | ICD-10-CM | POA: Diagnosis not present

## 2023-02-10 DIAGNOSIS — Z89511 Acquired absence of right leg below knee: Secondary | ICD-10-CM | POA: Diagnosis not present

## 2023-03-12 DIAGNOSIS — Z89511 Acquired absence of right leg below knee: Secondary | ICD-10-CM | POA: Diagnosis not present

## 2023-04-12 DIAGNOSIS — Z89511 Acquired absence of right leg below knee: Secondary | ICD-10-CM | POA: Diagnosis not present

## 2023-04-26 DIAGNOSIS — Z89512 Acquired absence of left leg below knee: Secondary | ICD-10-CM | POA: Diagnosis not present

## 2023-05-13 DIAGNOSIS — Z89511 Acquired absence of right leg below knee: Secondary | ICD-10-CM | POA: Diagnosis not present

## 2023-06-10 DIAGNOSIS — Z89511 Acquired absence of right leg below knee: Secondary | ICD-10-CM | POA: Diagnosis not present
